# Patient Record
Sex: Female | Born: 1980
Health system: Southern US, Community
[De-identification: ages and names within clinical notes are randomized; demographics above are authoritative.]

## PROBLEM LIST (undated history)

## (undated) DIAGNOSIS — F32A Depression, unspecified: Secondary | ICD-10-CM

## (undated) DIAGNOSIS — F419 Anxiety disorder, unspecified: Secondary | ICD-10-CM

## (undated) DIAGNOSIS — R51 Headache: Secondary | ICD-10-CM

## (undated) DIAGNOSIS — Z973 Presence of spectacles and contact lenses: Secondary | ICD-10-CM

## (undated) DIAGNOSIS — E079 Disorder of thyroid, unspecified: Secondary | ICD-10-CM

## (undated) DIAGNOSIS — R519 Headache, unspecified: Secondary | ICD-10-CM

## (undated) DIAGNOSIS — K30 Functional dyspepsia: Secondary | ICD-10-CM

## (undated) DIAGNOSIS — S82843A Displaced bimalleolar fracture of unspecified lower leg, initial encounter for closed fracture: Secondary | ICD-10-CM

## (undated) DIAGNOSIS — F329 Major depressive disorder, single episode, unspecified: Secondary | ICD-10-CM

## (undated) HISTORY — DX: Depression, unspecified: F32.A

## (undated) HISTORY — DX: Disorder of thyroid, unspecified: E07.9

## (undated) HISTORY — DX: Anxiety disorder, unspecified: F41.9

## (undated) HISTORY — DX: Headache: R51

## (undated) HISTORY — DX: Presence of spectacles and contact lenses: Z97.3

## (undated) HISTORY — DX: Headache, unspecified: R51.9

---
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
## 2001-03-22 ENCOUNTER — Other Ambulatory Visit: Admission: RE | Admit: 2001-03-22 | Discharge: 2001-03-22 | Payer: Self-pay | Admitting: Obstetrics & Gynecology

## 2001-08-26 ENCOUNTER — Inpatient Hospital Stay (HOSPITAL_COMMUNITY): Admission: AD | Admit: 2001-08-26 | Discharge: 2001-08-26 | Payer: Self-pay | Admitting: Obstetrics & Gynecology

## 2001-08-30 ENCOUNTER — Inpatient Hospital Stay (HOSPITAL_COMMUNITY): Admission: AD | Admit: 2001-08-30 | Discharge: 2001-08-30 | Payer: Self-pay | Admitting: Obstetrics and Gynecology

## 2001-10-17 ENCOUNTER — Inpatient Hospital Stay (HOSPITAL_COMMUNITY): Admission: AD | Admit: 2001-10-17 | Discharge: 2001-10-17 | Payer: Self-pay | Admitting: Obstetrics and Gynecology

## 2001-10-17 ENCOUNTER — Inpatient Hospital Stay (HOSPITAL_COMMUNITY): Admission: AD | Admit: 2001-10-17 | Discharge: 2001-10-20 | Payer: Self-pay | Admitting: Obstetrics and Gynecology

## 2001-11-28 ENCOUNTER — Other Ambulatory Visit: Admission: RE | Admit: 2001-11-28 | Discharge: 2001-11-28 | Payer: Self-pay | Admitting: Obstetrics & Gynecology

## 2003-02-22 ENCOUNTER — Other Ambulatory Visit: Admission: RE | Admit: 2003-02-22 | Discharge: 2003-02-22 | Payer: Self-pay | Admitting: Obstetrics and Gynecology

## 2003-12-31 ENCOUNTER — Inpatient Hospital Stay (HOSPITAL_COMMUNITY): Admission: AD | Admit: 2003-12-31 | Discharge: 2003-12-31 | Payer: Self-pay | Admitting: Obstetrics & Gynecology

## 2004-02-20 ENCOUNTER — Inpatient Hospital Stay (HOSPITAL_COMMUNITY): Admission: AD | Admit: 2004-02-20 | Discharge: 2004-02-22 | Payer: Self-pay | Admitting: Obstetrics & Gynecology

## 2004-03-28 ENCOUNTER — Other Ambulatory Visit: Admission: RE | Admit: 2004-03-28 | Discharge: 2004-03-28 | Payer: Self-pay | Admitting: Obstetrics & Gynecology

## 2006-03-15 ENCOUNTER — Ambulatory Visit: Payer: Self-pay | Admitting: Family Medicine

## 2006-12-28 ENCOUNTER — Ambulatory Visit: Payer: Self-pay | Admitting: Family Medicine

## 2007-03-16 ENCOUNTER — Other Ambulatory Visit: Admission: RE | Admit: 2007-03-16 | Discharge: 2007-03-16 | Payer: Self-pay | Admitting: Family Medicine

## 2007-03-16 ENCOUNTER — Ambulatory Visit: Payer: Self-pay | Admitting: Family Medicine

## 2007-07-14 ENCOUNTER — Ambulatory Visit: Payer: Self-pay | Admitting: Family Medicine

## 2008-03-01 ENCOUNTER — Ambulatory Visit: Payer: Self-pay | Admitting: Family Medicine

## 2008-03-19 ENCOUNTER — Other Ambulatory Visit: Admission: RE | Admit: 2008-03-19 | Discharge: 2008-03-19 | Payer: Self-pay | Admitting: Family Medicine

## 2008-03-19 ENCOUNTER — Ambulatory Visit: Payer: Self-pay | Admitting: Family Medicine

## 2008-04-24 ENCOUNTER — Ambulatory Visit: Payer: Self-pay | Admitting: Family Medicine

## 2008-12-06 ENCOUNTER — Ambulatory Visit: Payer: Self-pay | Admitting: Family Medicine

## 2009-05-09 ENCOUNTER — Ambulatory Visit: Payer: Self-pay | Admitting: Family Medicine

## 2009-05-09 ENCOUNTER — Other Ambulatory Visit: Admission: RE | Admit: 2009-05-09 | Discharge: 2009-05-09 | Payer: Self-pay | Admitting: Family Medicine

## 2010-05-06 ENCOUNTER — Ambulatory Visit: Payer: Self-pay | Admitting: Physician Assistant

## 2010-08-31 ENCOUNTER — Encounter: Payer: Self-pay | Admitting: Obstetrics & Gynecology

## 2010-12-26 NOTE — H&P (Signed)
NAMEJALIZA, Heather Maynard                       ACCOUNT NO.:  [id number]   MEDICAL RECORD NO.:  [id number]                   PATIENT TYPE:  INP   LOCATION:  9168                                 FACILITY:  [facility]   PHYSICIAN:  Freddy Finner, M.D.                DATE OF BIRTH:  10/30/1980   DATE OF ADMISSION:  02/20/2004  DATE OF DISCHARGE:                                HISTORY & PHYSICAL   ADMISSION DIAGNOSES:  1. Intrauterine pregnancy at term, with mild late onset.  2. Gestational hypertension/pregnancy-induced hypertension.  3. Favorable cervix, for induction.   HISTORY:  The patient is a 30 year old white married female, gravida 2, para  1, who has a history of pre-term labor with her first pregnancy, but carried  to term.  She has been followed closely for that potential problem during  this pregnancy.  She now has arrived at 40-1/7th weeks gestation.  We have  seen a mild rise in her blood pressure to 134/78, with a trace of  proteinuria.  She has had no other significant symptoms.  Her cervix is  found to be favorable, 2 cm dilated, 90% effaced, vertex -1.  She is now  admitted for delivery.  The [facility] labs on the day prior to this admission showed all parameters normal  except for an alkaline phosphatase of 292.  Non-stress test in the office  today was reactive.   REVIEW OF SYSTEMS:  Basically negative.   PAST MEDICAL HISTORY/FAMILY HISTORY:  According to the prenatal summary.   PHYSICAL EXAMINATION:  HEENT:  Grossly within normal limits.  NECK:  The thyroid gland is not palpably enlarged.  VITAL SIGNS:  Blood pressure in the office 134/78.  NEUROLOGIC:  Deep tendon reflexes are +1 to +2.  CHEST:  Clear to auscultation.  HEART:  A normal sinus rhythm without murmurs, rubs, or gallops.  ABDOMEN:  Gravid.  An estimated fetal weight of 7-1/2 pounds.  PELVIC:  Cervix is as noted above.  EXTREMITIES:  With +1 edema.   ASSESSMENT:  1. Intrauterine pregnancy at term.  2. Mild  gestational hypertension with favorable cervix, with impending     progression of pregnancy-induced hypertension.   PLAN:  IV Pitocin when appropriate.  Expect a vaginal delivery.                                               Freddy Finner, M.D.    WRN/MEDQ  D:  02/20/2004  T:  02/20/2004  Job:  [id number]

## 2011-02-20 LAB — RUBELLA ANTIBODY, IGM: Rubella: UNDETERMINED

## 2011-02-20 LAB — ABO/RH: RH Type: POSITIVE

## 2011-02-20 LAB — ANTIBODY SCREEN: Antibody Screen: NEGATIVE

## 2011-02-20 LAB — RPR: RPR: NONREACTIVE

## 2011-02-20 LAB — HIV ANTIBODY (ROUTINE TESTING W REFLEX): HIV: NONREACTIVE

## 2011-08-11 NOTE — L&D Delivery Note (Signed)
Delivery Note At 9:08 PM a viable female was delivered via Vaginal, Spontaneous Delivery (Presentation: Right Occiput Anterior).  APGAR: , ; weight .  8"7' Placenta status: Intact, Spontaneous.  Cord: 3 vessels with the following complications: None.  Cord pH: not sent  Anesthesia:   Episiotomy: small second degree Lacerations:  Suture Repair: 3.0 chromic Est. Blood Loss (mL): 300  Mom to postpartum.  Baby to nursery-stable.  Meriel Pica 09/25/2011, 9:18 PM

## 2011-08-31 LAB — STREP B DNA PROBE: GBS: NEGATIVE

## 2011-09-21 ENCOUNTER — Encounter (HOSPITAL_COMMUNITY): Payer: Self-pay | Admitting: *Deleted

## 2011-09-21 ENCOUNTER — Telehealth (HOSPITAL_COMMUNITY): Payer: Self-pay | Admitting: *Deleted

## 2011-09-21 NOTE — Telephone Encounter (Signed)
Preadmission screen  

## 2011-09-23 ENCOUNTER — Telehealth (HOSPITAL_COMMUNITY): Payer: Self-pay | Admitting: *Deleted

## 2011-09-23 NOTE — Telephone Encounter (Signed)
Preadmission screen  

## 2011-09-24 ENCOUNTER — Inpatient Hospital Stay (HOSPITAL_COMMUNITY): Admission: RE | Admit: 2011-09-24 | Payer: Self-pay | Source: Ambulatory Visit

## 2011-09-24 ENCOUNTER — Inpatient Hospital Stay (HOSPITAL_COMMUNITY): Admission: AD | Admit: 2011-09-24 | Payer: Self-pay | Source: Ambulatory Visit | Admitting: Obstetrics and Gynecology

## 2011-09-24 NOTE — H&P (Signed)
[facility]  DICTATION # [id number] CSN# [id number]   Meriel Pica, MD 09/24/2011 10:47 AM

## 2011-09-25 ENCOUNTER — Encounter (HOSPITAL_COMMUNITY): Payer: Self-pay

## 2011-09-25 ENCOUNTER — Encounter (HOSPITAL_COMMUNITY): Payer: Self-pay | Admitting: Anesthesiology

## 2011-09-25 ENCOUNTER — Inpatient Hospital Stay (HOSPITAL_COMMUNITY): Payer: BC Managed Care – PPO | Admitting: Anesthesiology

## 2011-09-25 ENCOUNTER — Inpatient Hospital Stay (HOSPITAL_COMMUNITY)
Admission: RE | Admit: 2011-09-25 | Discharge: 2011-09-27 | DRG: 373 | Disposition: A | Discharge status: Home / Self Care | Payer: BC Managed Care – PPO | Source: Ambulatory Visit | Attending: Obstetrics and Gynecology | Admitting: Obstetrics and Gynecology

## 2011-09-25 LAB — CBC
MCV: 84.6 fL (ref 78.0–100.0)
Platelets: 291 10*3/uL (ref 150–400)
RBC: 4.35 MIL/uL (ref 3.87–5.11)
WBC: 11.1 10*3/uL — ABNORMAL HIGH (ref 4.0–10.5)

## 2011-09-25 LAB — RPR: RPR Ser Ql: NONREACTIVE

## 2011-09-25 MED ORDER — ZOLPIDEM TARTRATE 5 MG PO TABS: 5.0000 mg | ORAL_TABLET | Freq: Every evening | ORAL | Status: DC | PRN

## 2011-09-25 MED ORDER — OXYCODONE-ACETAMINOPHEN 5-325 MG PO TABS
1.0000 | ORAL_TABLET | Freq: Four times a day (QID) | ORAL | Status: DC | PRN
  Administered 2011-09-25 – 2011-09-27 (×4): 2 via ORAL
  Administered 2011-09-27: 1 via ORAL
  Filled 2011-09-25 (×2): qty 2
  Filled 2011-09-25: qty 1
  Filled 2011-09-25 (×2): qty 2

## 2011-09-25 MED ORDER — IBUPROFEN 600 MG PO TABS
600.0000 mg | ORAL_TABLET | Freq: Four times a day (QID) | ORAL | Status: DC | PRN
  Filled 2011-09-25: qty 1

## 2011-09-25 MED ORDER — ONDANSETRON HCL 4 MG/2ML IJ SOLN: 4.0000 mg | INTRAMUSCULAR | Status: DC | PRN

## 2011-09-25 MED ORDER — BENZOCAINE-MENTHOL 20-0.5 % EX AERO
1.0000 "application " | INHALATION_SPRAY | CUTANEOUS | Status: DC | PRN
  Administered 2011-09-25: 1 via TOPICAL

## 2011-09-25 MED ORDER — IBUPROFEN 800 MG PO TABS
800.0000 mg | ORAL_TABLET | Freq: Three times a day (TID) | ORAL | Status: DC | PRN
  Administered 2011-09-26 – 2011-09-27 (×4): 800 mg via ORAL
  Filled 2011-09-25 (×5): qty 1

## 2011-09-25 MED ORDER — FLEET ENEMA 7-19 GM/118ML RE ENEM: 1.0000 | ENEMA | RECTAL | Status: DC | PRN

## 2011-09-25 MED ORDER — DIBUCAINE 1 % RE OINT: 1.0000 "application " | TOPICAL_OINTMENT | RECTAL | Status: DC | PRN

## 2011-09-25 MED ORDER — LACTATED RINGERS IV SOLN: 500.0000 mL | INTRAVENOUS | Status: DC | PRN

## 2011-09-25 MED ORDER — LIDOCAINE HCL 1.5 % IJ SOLN
INTRAMUSCULAR | Status: DC | PRN
  Administered 2011-09-25 (×2): 5 mL via EPIDURAL

## 2011-09-25 MED ORDER — PRENATAL MULTIVITAMIN CH
1.0000 | ORAL_TABLET | Freq: Every day | ORAL | Status: DC
  Administered 2011-09-26 – 2011-09-27 (×2): 1 via ORAL
  Filled 2011-09-25 (×2): qty 1

## 2011-09-25 MED ORDER — LACTATED RINGERS IV SOLN
INTRAVENOUS | Status: DC
  Administered 2011-09-25 (×2): 125 mL/h via INTRAVENOUS

## 2011-09-25 MED ORDER — TETANUS-DIPHTH-ACELL PERTUSSIS 5-2.5-18.5 LF-MCG/0.5 IM SUSP: 0.5000 mL | Freq: Once | INTRAMUSCULAR | Status: DC

## 2011-09-25 MED ORDER — LANOLIN HYDROUS EX OINT: TOPICAL_OINTMENT | CUTANEOUS | Status: DC | PRN

## 2011-09-25 MED ORDER — FENTANYL 2.5 MCG/ML BUPIVACAINE 1/10 % EPIDURAL INFUSION (WH - ANES)
14.0000 mL/h | INTRAMUSCULAR | Status: DC
  Administered 2011-09-25: 14 mL/h via EPIDURAL
  Filled 2011-09-25 (×2): qty 60

## 2011-09-25 MED ORDER — OXYTOCIN BOLUS FROM INFUSION
500.0000 mL | Freq: Once | INTRAVENOUS | Status: DC
  Filled 2011-09-25: qty 500
  Filled 2011-09-25: qty 1000

## 2011-09-25 MED ORDER — MEASLES, MUMPS & RUBELLA VAC ~~LOC~~ INJ
0.5000 mL | INJECTION | Freq: Once | SUBCUTANEOUS | Status: AC
  Administered 2011-09-27: 0.5 mL via SUBCUTANEOUS
  Filled 2011-09-25 (×2): qty 0.5

## 2011-09-25 MED ORDER — OXYTOCIN 20 UNITS IN LACTATED RINGERS INFUSION - SIMPLE: 125.0000 mL/h | Freq: Once | INTRAVENOUS | Status: DC

## 2011-09-25 MED ORDER — DIPHENHYDRAMINE HCL 25 MG PO CAPS: 25.0000 mg | ORAL_CAPSULE | Freq: Four times a day (QID) | ORAL | Status: DC | PRN

## 2011-09-25 MED ORDER — SIMETHICONE 80 MG PO CHEW: 80.0000 mg | CHEWABLE_TABLET | ORAL | Status: DC | PRN

## 2011-09-25 MED ORDER — FLEET ENEMA 7-19 GM/118ML RE ENEM: 1.0000 | ENEMA | Freq: Every day | RECTAL | Status: DC | PRN

## 2011-09-25 MED ORDER — OXYTOCIN 20 UNITS IN LACTATED RINGERS INFUSION - SIMPLE
1.0000 m[IU]/min | INTRAVENOUS | Status: DC
  Administered 2011-09-25: 2 m[IU]/min via INTRAVENOUS

## 2011-09-25 MED ORDER — OXYCODONE-ACETAMINOPHEN 5-325 MG PO TABS: 1.0000 | ORAL_TABLET | ORAL | Status: DC | PRN

## 2011-09-25 MED ORDER — ONDANSETRON HCL 4 MG/2ML IJ SOLN
4.0000 mg | Freq: Four times a day (QID) | INTRAMUSCULAR | Status: DC | PRN
  Administered 2011-09-25: 4 mg via INTRAVENOUS
  Filled 2011-09-25: qty 2

## 2011-09-25 MED ORDER — ACETAMINOPHEN 325 MG PO TABS: 650.0000 mg | ORAL_TABLET | ORAL | Status: DC | PRN

## 2011-09-25 MED ORDER — DIPHENHYDRAMINE HCL 50 MG/ML IJ SOLN: 12.5000 mg | INTRAMUSCULAR | Status: DC | PRN

## 2011-09-25 MED ORDER — BISACODYL 10 MG RE SUPP: 10.0000 mg | Freq: Every day | RECTAL | Status: DC | PRN

## 2011-09-25 MED ORDER — FENTANYL 2.5 MCG/ML BUPIVACAINE 1/10 % EPIDURAL INFUSION (WH - ANES)
INTRAMUSCULAR | Status: DC | PRN
  Administered 2011-09-25: 14 mL/h via EPIDURAL

## 2011-09-25 MED ORDER — BENZOCAINE-MENTHOL 20-0.5 % EX AERO
INHALATION_SPRAY | CUTANEOUS | Status: AC
  Filled 2011-09-25: qty 56

## 2011-09-25 MED ORDER — ONDANSETRON HCL 4 MG PO TABS: 4.0000 mg | ORAL_TABLET | ORAL | Status: DC | PRN

## 2011-09-25 MED ORDER — LACTATED RINGERS IV SOLN
500.0000 mL | Freq: Once | INTRAVENOUS | Status: AC
  Administered 2011-09-25: 500 mL via INTRAVENOUS

## 2011-09-25 MED ORDER — SENNOSIDES-DOCUSATE SODIUM 8.6-50 MG PO TABS: 2.0000 | ORAL_TABLET | Freq: Every day | ORAL | Status: DC

## 2011-09-25 MED ORDER — WITCH HAZEL-GLYCERIN EX PADS: 1.0000 "application " | MEDICATED_PAD | CUTANEOUS | Status: DC | PRN

## 2011-09-25 MED ORDER — LIDOCAINE HCL (PF) 1 % IJ SOLN: 30.0000 mL | INTRAMUSCULAR | Status: DC | PRN

## 2011-09-25 MED ORDER — EPHEDRINE 5 MG/ML INJ
10.0000 mg | INTRAVENOUS | Status: DC | PRN
  Filled 2011-09-25: qty 4

## 2011-09-25 MED ORDER — CITRIC ACID-SODIUM CITRATE 334-500 MG/5ML PO SOLN: 30.0000 mL | ORAL | Status: DC | PRN

## 2011-09-25 MED ORDER — PHENYLEPHRINE 40 MCG/ML (10ML) SYRINGE FOR IV PUSH (FOR BLOOD PRESSURE SUPPORT)
80.0000 ug | PREFILLED_SYRINGE | INTRAVENOUS | Status: DC | PRN
  Filled 2011-09-25: qty 5

## 2011-09-25 MED ORDER — PHENYLEPHRINE 40 MCG/ML (10ML) SYRINGE FOR IV PUSH (FOR BLOOD PRESSURE SUPPORT): 80.0000 ug | PREFILLED_SYRINGE | INTRAVENOUS | Status: DC | PRN

## 2011-09-25 MED ORDER — TERBUTALINE SULFATE 1 MG/ML IJ SOLN: 0.2500 mg | Freq: Once | INTRAMUSCULAR | Status: DC | PRN

## 2011-09-25 MED ORDER — EPHEDRINE 5 MG/ML INJ: 10.0000 mg | INTRAVENOUS | Status: DC | PRN

## 2011-09-25 NOTE — Anesthesia Preprocedure Evaluation (Addendum)
Anesthesia Evaluation  Patient identified by MRN, date of birth, ID band Patient awake    Reviewed: Allergy & Precautions, H&P , NPO status , Patient's Chart, lab work & pertinent test results  Airway Mallampati: II TM Distance: >3 FB Neck ROM: full    Dental No notable dental hx.    Pulmonary neg pulmonary ROS,  clear to auscultation  Pulmonary exam normal       Cardiovascular neg cardio ROS     Neuro/Psych PSYCHIATRIC DISORDERS Anxiety Negative Neurological ROS     GI/Hepatic negative GI ROS, Neg liver ROS,   Endo/Other  Negative Endocrine ROS  Renal/GU negative Renal ROS  Genitourinary negative   Musculoskeletal negative musculoskeletal ROS (+)   Abdominal Normal abdominal exam  (+)   Peds negative pediatric ROS (+)  Hematology negative hematology ROS (+)   Anesthesia Other Findings   Reproductive/Obstetrics (+) Pregnancy                           Anesthesia Physical Anesthesia Plan  ASA: II  Anesthesia Plan: Epidural   Post-op Pain Management:    Induction:   Airway Management Planned:   Additional Equipment:   Intra-op Plan:   Post-operative Plan:   Informed Consent: I have reviewed the patients History and Physical, chart, labs and discussed the procedure including the risks, benefits and alternatives for the proposed anesthesia with the patient or authorized representative who has indicated his/her understanding and acceptance.     Plan Discussed with:   Anesthesia Plan Comments:         Anesthesia Quick Evaluation

## 2011-09-25 NOTE — H&P (Signed)
NAME:  Heather Maynard, Heather Maynard                  ACCOUNT NO.:  MEDICAL RECORD NO.:  [id number]  LOCATION:                                 FACILITY:  PHYSICIAN:  Duke Salvia. Marcelle Overlie, M.D.DATE OF BIRTH:  1981-01-28  DATE OF ADMISSION:  09/25/2011 DATE OF DISCHARGE:                             HISTORY & PHYSICAL   CHIEF COMPLAINT:  For labor induction, term, favorable cervix.  HISTORY OF PRESENT ILLNESS:  A 31 year old G73, P2-0-0-2, EDD is September 24, 2011, presents for induction of labor at term with a favorable cervix, GBS was negative.  Her 1-hour GTT was normal at 103.  Early pregnancy screening including first trimester screen and AFP only returned normal.  Prenatal blood work normal except for rubella titer that was equivocal.  PAST MEDICAL HISTORY:  Please see the [facility] form for details.  PHYSICAL EXAMINATION:  VITAL SIGNS:  Temp 98.2, blood pressure 110/62. HEENT:  Unremarkable. NECK:  Supple without masses. LUNGS:  Clear. CARDIOVASCULAR:  Regular rate and rhythm without murmurs, rubs, or gallops. BREASTS:  Not examined, term, fundal height.  Fetal heart rate 140. Cervix was 2, 50% vertex, -2.  Membranes intact. EXTREMITIES:  Unremarkable. NEUROLOGIC:  Unremarkable.  IMPRESSION:  Term intrauterine pregnancy, favorable cervix, GBS negative.  PLAN:  We will admit for AROM labor induction, procedure and protocol discussed with Dr. Marcelle Overlie.     Avaley Coop M. Marcelle Overlie, M.D.     RMH/MEDQ  D:  09/24/2011  T:  09/25/2011  Job:  [id number]

## 2011-09-25 NOTE — Anesthesia Procedure Notes (Signed)
Epidural Patient location during procedure: OB Start time: 09/25/2011 5:09 PM End time: 09/25/2011 5:13 PM Reason for block: procedure for pain  Staffing Anesthesiologist: Sandrea Hughs Performed by: anesthesiologist   Preanesthetic Checklist Completed: patient identified, site marked, surgical consent, pre-op evaluation, timeout performed, IV checked, risks and benefits discussed and monitors and equipment checked  Epidural Patient position: sitting Prep: site prepped and draped and DuraPrep Patient monitoring: continuous pulse ox and blood pressure Approach: midline Injection technique: LOR air  Needle:  Needle type: Tuohy  Needle gauge: 17 G Needle length: 9 cm Needle insertion depth: 5 cm cm Catheter type: closed end flexible Catheter size: 19 Gauge Catheter at skin depth: 10 cm Test dose: negative and 1.5% lidocaine  Assessment Sensory level: T8 Events: blood not aspirated, injection not painful, no injection resistance, negative IV test and no paresthesia

## 2011-09-26 LAB — CBC
Hemoglobin: 11.2 g/dL — ABNORMAL LOW (ref 12.0–15.0)
MCH: 28.1 pg (ref 26.0–34.0)
MCHC: 32.8 g/dL (ref 30.0–36.0)
Platelets: 255 10*3/uL (ref 150–400)
RDW: 14.6 % (ref 11.5–15.5)

## 2011-09-26 MED ORDER — FAMOTIDINE 20 MG PO TABS: 20.0000 mg | ORAL_TABLET | Freq: Two times a day (BID) | ORAL | Status: DC

## 2011-09-26 MED ORDER — CALCIUM CARBONATE ANTACID 500 MG PO CHEW
400.0000 mg | CHEWABLE_TABLET | Freq: Once | ORAL | Status: AC
  Administered 2011-09-26: 400 mg via ORAL
  Filled 2011-09-26: qty 2

## 2011-09-26 NOTE — Addendum Note (Signed)
Addendum  created 09/26/11 1017 by Doreene Burke, CRNA   Modules edited:Charges VN, Notes Section

## 2011-09-26 NOTE — Anesthesia Postprocedure Evaluation (Signed)
Anesthesia Post Note  Patient: Heather Maynard  Procedure(s) Performed: * No procedures listed *  Anesthesia type: Epidural  Patient location: Mother/Baby  Post pain: Pain level controlled  Post assessment: Post-op Vital signs reviewed  Last Vitals:  Filed Vitals:   09/26/11 0419  BP: 115/69  Pulse: 66  Temp: 36.4 C  Resp: 20    Post vital signs: Reviewed  Level of consciousness: awake  Complications: No apparent anesthesia complications

## 2011-09-26 NOTE — Progress Notes (Signed)
Post Partum Day 1 Subjective: no complaints  Objective: Blood pressure 115/69, pulse 66, temperature 97.6 F (36.4 C), temperature source Oral, resp. rate 20, height 5\' 4"  (1.626 m), weight 191 lb (86.637 kg), last menstrual period 12/18/2010, SpO2 99.00%, unknown if currently breastfeeding.  Physical Exam:  General: alert Lochia: appropriate Uterine Fundus: firm Incision: NA DVT Evaluation: No evidence of DVT seen on physical exam.   Basename 09/26/11 0602 09/25/11 0800  HGB 11.2* 12.3  HCT 34.1* 36.8    Assessment/Plan: Plan for discharge tomorrow   LOS: 1 day   Heather Maynard M 09/26/2011, 9:21 AM

## 2011-09-26 NOTE — Anesthesia Postprocedure Evaluation (Signed)
Anesthesia Post Note  Patient: Heather Maynard  Procedure(s) Performed: * No procedures listed *  Anesthesia type: Epidural  Patient location: Mother/Baby  Post pain: Pain level controlled  Post assessment: Post-op Vital signs reviewed  Last Vitals:  Filed Vitals:   09/25/11 2247  BP: 120/49  Pulse: 86  Temp:   Resp: 18    Post vital signs: Reviewed  Level of consciousness: awake  Complications: No apparent anesthesia complications

## 2011-09-26 NOTE — Progress Notes (Signed)

## 2011-09-27 MED ORDER — IBUPROFEN 800 MG PO TABS: 800.0000 mg | ORAL_TABLET | Freq: Three times a day (TID) | ORAL | Status: AC | PRN

## 2011-09-27 MED ORDER — OXYCODONE-ACETAMINOPHEN 5-325 MG PO TABS: 1.0000 | ORAL_TABLET | Freq: Four times a day (QID) | ORAL | Status: AC | PRN

## 2011-09-27 NOTE — Progress Notes (Signed)
Post Partum Day 2 Subjective: no complaints  Objective: Blood pressure 101/64, pulse 64, temperature 97.9 F (36.6 C), temperature source Oral, resp. rate 18, height 5\' 4"  (1.626 m), weight 191 lb (86.637 kg), last menstrual period 12/18/2010, SpO2 99.00%, unknown if currently breastfeeding.  Physical Exam:  General: alert Lochia: appropriate Uterine Fundus: firm Incision: healing well DVT Evaluation: No evidence of DVT seen on physical exam.   Basename 09/26/11 0602 09/25/11 0800  HGB 11.2* 12.3  HCT 34.1* 36.8    Assessment/Plan: Discharge home   LOS: 2 days   Ashford Clouse M 09/27/2011, 9:20 AM

## 2011-09-27 NOTE — Discharge Summary (Signed)
Obstetric Discharge Summary Reason for Admission: induction of labor Prenatal Procedures: none Intrapartum Procedures: spontaneous vaginal delivery Postpartum Procedures: none Complications-Operative and Postpartum: none Hemoglobin  Date Value Range Status  09/26/2011 11.2* 12.0-15.0 (g/dL) Final     HCT  Date Value Range Status  09/26/2011 34.1* 36.0-46.0 (%) Final    Discharge Diagnoses: Term Pregnancy-delivered  Discharge Information: Date: 09/27/2011 Activity: pelvic rest Diet: routine Medications: Ibuprofen and Percocet Condition: stable Instructions: refer to practice specific booklet Discharge to: home Follow-up Information    Follow up with Meriel Pica, MD. Schedule an appointment as soon as possible for a visit in 6 weeks.   Contact information:   [address] [place] [address] [phone]          Newborn Data: Live born female  Birth Weight: 8 lb 7.5 oz (3840 g) APGAR: 9, 10  Home with mother.  Jenaro Souder M 09/27/2011, 9:22 AM

## 2013-07-03 ENCOUNTER — Emergency Department (HOSPITAL_COMMUNITY)
Admission: EM | Admit: 2013-07-03 | Discharge: 2013-07-03 | Disposition: A | Discharge status: Home / Self Care | Payer: 59 | Source: Home / Self Care | Attending: Family Medicine | Admitting: Family Medicine

## 2013-07-03 ENCOUNTER — Encounter (HOSPITAL_COMMUNITY): Payer: Self-pay | Admitting: Emergency Medicine

## 2013-07-03 DIAGNOSIS — M545 Low back pain: Secondary | ICD-10-CM

## 2013-07-03 MED ORDER — CYCLOBENZAPRINE HCL 10 MG PO TABS: 10.0000 mg | ORAL_TABLET | Freq: Every evening | ORAL | Status: DC | PRN

## 2013-07-03 MED ORDER — MELOXICAM 15 MG PO TABS: 15.0000 mg | ORAL_TABLET | Freq: Every day | ORAL | Status: DC | PRN

## 2013-07-03 MED ORDER — TRAMADOL HCL 50 MG PO TABS: 50.0000 mg | ORAL_TABLET | Freq: Four times a day (QID) | ORAL | Status: DC | PRN

## 2013-07-03 NOTE — ED Provider Notes (Signed)
Heather Maynard is a 32 y.o. female who presents to Urgent Care today for left-sided low back pain starting 2 days ago. Patient has moderate pain worse with activity. She denies any radiation weakness numbness bowel bladder dysfunction or difficulty walking. She's tried ibuprofen, heat, and icy hot which have not worked very well. She's had 2 previous episodes similar to this in the recent past. She denies any injury.   Past Medical History  Diagnosis Date  . History of chicken pox   . Anxiety     panic attacks   History  Substance Use Topics  . Smoking status: Never Smoker   . Smokeless tobacco: Never Used  . Alcohol Use: No   ROS as above Medications reviewed. No current facility-administered medications for this encounter.   Current Outpatient Prescriptions  Medication Sig Dispense Refill  . cyclobenzaprine (FLEXERIL) 10 MG tablet Take 1 tablet (10 mg total) by mouth at bedtime as needed for muscle spasms.  20 tablet  0  . meloxicam (MOBIC) 15 MG tablet Take 1 tablet (15 mg total) by mouth daily as needed for pain.  30 tablet  0  . traMADol (ULTRAM) 50 MG tablet Take 1 tablet (50 mg total) by mouth every 6 (six) hours as needed.  15 tablet  0    Exam:  BP 132/77  Pulse 75  Temp(Src) 98.4 F (36.9 C) (Oral)  Resp 16  SpO2 100%  LMP 06/08/2013 Gen: Well NAD BACK: Nontender to spinal midline. Tender palpation left SI joint.  Negative straight leg raise and Faber bilaterally. Range of motion is intact. Strength is intact bilateral lower extremities. Reflexes are intact bilaterally.   No results found for this or any previous visit (from the past 24 hour(s)). No results found.  Assessment and Plan: 32 y.o. female with lumbago. Plan to treat with meloxicam, Flexeril, tramadol. Additionally home exercise and heating pad. Followup at sports medicine.  Discussed warning signs or symptoms. Please see discharge instructions. Patient expresses understanding.      Rodolph Bong, MD 07/03/13 2024

## 2013-07-03 NOTE — ED Notes (Signed)
Pt c/o lower back pain onset Saturday... Reports this is the 3rd time this has happened w/in a 5 month time frame States she was putting her daughter inside her car seat when she started to feel the pain Denies: f/v/n/d, urinary sxs Alert w/no signs of acute distress.

## 2013-07-24 NOTE — H&P (Signed)
JOYELL EMAMI  DICTATION # [id number] CSN# [id number]   Meriel Pica, MD 07/24/2013 1:51 PM

## 2013-07-25 ENCOUNTER — Encounter (HOSPITAL_COMMUNITY): Payer: Self-pay | Admitting: Pharmacist

## 2013-07-26 NOTE — H&P (Signed)
Heather Maynard, Heather Maynard             ACCOUNT NO.:  [id number]  MEDICAL RECORD NO.:  [id number]  LOCATION:                                FACILITY:  [facility]  PHYSICIAN:  Duke Salvia. Marcelle Overlie, M.D.DATE OF BIRTH:  10-22-1980  DATE OF ADMISSION:  08/01/2013 DATE OF DISCHARGE:                             HISTORY & PHYSICAL   CHIEF COMPLAINT:  Requests permanent sterilization.  HISTORY OF PRESENT ILLNESS:  A 32 year old, G3, P3, has __________ , currently using condoms, presents for outpatient tubal ligation.  She is sure she would not want to be pregnant again under any circumstance. This procedure including specific risks related to bleeding, infection. Other complications may require open and additional surgery such as bleeding discussed with her.  The permanence of the procedure and failure rated 2 to 3 per 1000 discussed with her which she understands and accepts.  PAST MEDICAL HISTORY:  Allergies none.  CURRENT MEDICATIONS:  Meloxicam p.r.n.  OBSTETRICAL HISTORY:  Three vaginal deliveries.  REVIEW OF SYSTEMS:  Negative.  FAMILY HISTORY:  Significant for UTI, diabetes and unspecified cancer.  SOCIAL HISTORY:  Denies alcohol, tobacco, or drug use.  She is married.  Last Pap, November 2014, was normal.  PHYSICAL EXAMINATION:  VITAL SIGNS:  Temp 98.2, blood pressure 110/70. HEENT:  Unremarkable. NECK:  Supple without masses. LUNGS:  Clear. CARDIOVASCULAR:  Regular rate and rhythm without murmurs, rubs, or gallops. BREASTS:  Without masses. ABDOMEN:  Soft, flat, nontender. GU:  Vulva, vagina and cervix normal.  Uterus anterior normal size. Adnexa negative.  EXTREMITIES:  Unremarkable. NEUROLOGIC:  Unremarkable.  IMPRESSION:  Requests permanent sterilization.  PLAN:  Laparoscopic tubal with Filshie clip application.     Heather Maynard M. Marcelle Overlie, M.D.     RMH/MEDQ  D:  07/24/2013  T:  07/25/2013  Job:  [id number]

## 2013-08-01 ENCOUNTER — Ambulatory Visit (HOSPITAL_COMMUNITY): Payer: 59 | Admitting: Anesthesiology

## 2013-08-01 ENCOUNTER — Encounter (HOSPITAL_COMMUNITY): Payer: Self-pay | Admitting: Registered Nurse

## 2013-08-01 ENCOUNTER — Encounter (HOSPITAL_COMMUNITY): Payer: 59 | Admitting: Anesthesiology

## 2013-08-01 ENCOUNTER — Encounter (HOSPITAL_COMMUNITY)
Admission: RE | Disposition: A | Discharge status: Home / Self Care | Payer: Self-pay | Source: Ambulatory Visit | Attending: Obstetrics and Gynecology

## 2013-08-01 ENCOUNTER — Ambulatory Visit (HOSPITAL_COMMUNITY)
Admission: RE | Admit: 2013-08-01 | Discharge: 2013-08-01 | Disposition: A | Discharge status: Home / Self Care | Payer: 59 | Source: Ambulatory Visit | Attending: Obstetrics and Gynecology | Admitting: Obstetrics and Gynecology

## 2013-08-01 ENCOUNTER — Inpatient Hospital Stay (HOSPITAL_COMMUNITY)
Admission: AD | Admit: 2013-08-01 | Discharge: 2013-08-01 | Discharge status: Against Medical Advice | Payer: 59 | Source: Ambulatory Visit | Attending: Obstetrics and Gynecology | Admitting: Obstetrics and Gynecology

## 2013-08-01 DIAGNOSIS — Z302 Encounter for sterilization: Secondary | ICD-10-CM | POA: Insufficient documentation

## 2013-08-01 HISTORY — PX: LAPAROSCOPIC TUBAL LIGATION: SHX1937

## 2013-08-01 LAB — CBC
HCT: 38.5 % (ref 36.0–46.0)
Hemoglobin: 13.2 g/dL (ref 12.0–15.0)
MCH: 28.4 pg (ref 26.0–34.0)
MCV: 83 fL (ref 78.0–100.0)
Platelets: 308 10*3/uL (ref 150–400)
RBC: 4.64 MIL/uL (ref 3.87–5.11)
RDW: 13 % (ref 11.5–15.5)
WBC: 8.2 10*3/uL (ref 4.0–10.5)

## 2013-08-01 LAB — PREGNANCY, URINE: Preg Test, Ur: NEGATIVE

## 2013-08-01 SURGERY — LIGATION, FALLOPIAN TUBE, LAPAROSCOPIC
Anesthesia: General | Site: Abdomen | Laterality: Bilateral

## 2013-08-01 MED ORDER — LIDOCAINE HCL (CARDIAC) 20 MG/ML IV SOLN
INTRAVENOUS | Status: DC | PRN
  Administered 2013-08-01: 50 mg via INTRAVENOUS

## 2013-08-01 MED ORDER — FENTANYL CITRATE 0.05 MG/ML IJ SOLN
INTRAMUSCULAR | Status: DC | PRN
  Administered 2013-08-01 (×2): 50 ug via INTRAVENOUS
  Administered 2013-08-01: 100 ug via INTRAVENOUS

## 2013-08-01 MED ORDER — SODIUM CHLORIDE 0.9 % IJ SOLN
INTRAMUSCULAR | Status: AC
  Filled 2013-08-01: qty 10

## 2013-08-01 MED ORDER — HYDROCODONE-ACETAMINOPHEN 5-325 MG PO TABS
1.0000 | ORAL_TABLET | Freq: Once | ORAL | Status: AC
  Administered 2013-08-01: 1 via ORAL

## 2013-08-01 MED ORDER — BUPIVACAINE HCL (PF) 0.25 % IJ SOLN
INTRAMUSCULAR | Status: AC
  Filled 2013-08-01: qty 30

## 2013-08-01 MED ORDER — MIDAZOLAM HCL 2 MG/2ML IJ SOLN
INTRAMUSCULAR | Status: AC
  Filled 2013-08-01: qty 2

## 2013-08-01 MED ORDER — ONDANSETRON HCL 4 MG/2ML IJ SOLN
INTRAMUSCULAR | Status: AC
  Filled 2013-08-01: qty 2

## 2013-08-01 MED ORDER — SUCCINYLCHOLINE CHLORIDE 20 MG/ML IJ SOLN
INTRAMUSCULAR | Status: AC
  Filled 2013-08-01: qty 10

## 2013-08-01 MED ORDER — HYDROCODONE-ACETAMINOPHEN 5-325 MG PO TABS
ORAL_TABLET | ORAL | Status: AC
  Filled 2013-08-01: qty 1

## 2013-08-01 MED ORDER — FENTANYL CITRATE 0.05 MG/ML IJ SOLN
INTRAMUSCULAR | Status: AC
  Filled 2013-08-01: qty 2

## 2013-08-01 MED ORDER — LACTATED RINGERS IV SOLN
INTRAVENOUS | Status: DC
  Administered 2013-08-01 (×2): via INTRAVENOUS

## 2013-08-01 MED ORDER — BUPIVACAINE HCL (PF) 0.25 % IJ SOLN
INTRAMUSCULAR | Status: DC | PRN
  Administered 2013-08-01: 7 mL

## 2013-08-01 MED ORDER — KETOROLAC TROMETHAMINE 30 MG/ML IJ SOLN
INTRAMUSCULAR | Status: DC | PRN
  Administered 2013-08-01: 30 mg via INTRAVENOUS

## 2013-08-01 MED ORDER — ONDANSETRON HCL 4 MG/2ML IJ SOLN
INTRAMUSCULAR | Status: DC | PRN
  Administered 2013-08-01: 4 mg via INTRAVENOUS

## 2013-08-01 MED ORDER — DEXAMETHASONE SODIUM PHOSPHATE 10 MG/ML IJ SOLN
INTRAMUSCULAR | Status: DC | PRN
  Administered 2013-08-01: 10 mg via INTRAVENOUS

## 2013-08-01 MED ORDER — SODIUM CHLORIDE 0.9 % IJ SOLN
INTRAMUSCULAR | Status: DC | PRN
  Administered 2013-08-01: 10 mL

## 2013-08-01 MED ORDER — FENTANYL CITRATE 0.05 MG/ML IJ SOLN
25.0000 ug | INTRAMUSCULAR | Status: DC | PRN
  Administered 2013-08-01 (×3): 50 ug via INTRAVENOUS

## 2013-08-01 MED ORDER — HYDROCODONE-IBUPROFEN 7.5-200 MG PO TABS: 1.0000 | ORAL_TABLET | Freq: Three times a day (TID) | ORAL | Status: DC | PRN

## 2013-08-01 MED ORDER — MIDAZOLAM HCL 5 MG/5ML IJ SOLN
INTRAMUSCULAR | Status: DC | PRN
  Administered 2013-08-01: 2 mg via INTRAVENOUS

## 2013-08-01 MED ORDER — PROPOFOL 10 MG/ML IV BOLUS
INTRAVENOUS | Status: DC | PRN
  Administered 2013-08-01: 200 mg via INTRAVENOUS

## 2013-08-01 MED ORDER — PROPOFOL 10 MG/ML IV EMUL
INTRAVENOUS | Status: AC
  Filled 2013-08-01: qty 20

## 2013-08-01 MED ORDER — DEXAMETHASONE SODIUM PHOSPHATE 10 MG/ML IJ SOLN
INTRAMUSCULAR | Status: AC
  Filled 2013-08-01: qty 1

## 2013-08-01 MED ORDER — FENTANYL CITRATE 0.05 MG/ML IJ SOLN
INTRAMUSCULAR | Status: AC
  Administered 2013-08-01: 50 ug via INTRAVENOUS
  Filled 2013-08-01: qty 2

## 2013-08-01 MED ORDER — SUCCINYLCHOLINE CHLORIDE 20 MG/ML IJ SOLN
INTRAMUSCULAR | Status: DC | PRN
  Administered 2013-08-01: 100 mg via INTRAVENOUS

## 2013-08-01 MED ORDER — HYDROCODONE-ACETAMINOPHEN 7.5-325 MG/15ML PO SOLN: 10.0000 mL | Freq: Once | ORAL | Status: DC

## 2013-08-01 MED ORDER — LIDOCAINE HCL (CARDIAC) 20 MG/ML IV SOLN
INTRAVENOUS | Status: AC
Start: 2013-08-01 — End: 2013-08-01
  Filled 2013-08-01: qty 5

## 2013-08-01 MED ORDER — KETOROLAC TROMETHAMINE 30 MG/ML IJ SOLN
INTRAMUSCULAR | Status: AC
  Filled 2013-08-01: qty 1

## 2013-08-01 SURGICAL SUPPLY — 19 items
ADH SKN CLS APL DERMABOND .7 (GAUZE/BANDAGES/DRESSINGS) ×1
CATH ROBINSON RED A/P 16FR (CATHETERS) ×2 IMPLANT
CLIP FILSHIE TUBAL LIGA STRL (Clip) ×3 IMPLANT
CLOTH BEACON ORANGE TIMEOUT ST (SAFETY) ×2 IMPLANT
DERMABOND ADVANCED (GAUZE/BANDAGES/DRESSINGS) ×1
DERMABOND ADVANCED .7 DNX12 (GAUZE/BANDAGES/DRESSINGS) ×1 IMPLANT
DRSG COVADERM PLUS 2X2 (GAUZE/BANDAGES/DRESSINGS) ×1 IMPLANT
GLOVE BIO SURGEON STRL SZ7 (GLOVE) ×2 IMPLANT
GOWN PREVENTION PLUS LG XLONG (DISPOSABLE) ×4 IMPLANT
NEEDLE INSUFFLATION 120MM (ENDOMECHANICALS) ×2 IMPLANT
PACK LAPAROSCOPY BASIN (CUSTOM PROCEDURE TRAY) ×2 IMPLANT
STRIP CLOSURE SKIN 1/4X3 (GAUZE/BANDAGES/DRESSINGS) ×1 IMPLANT
SUT VIC AB 2-0 UR6 27 (SUTURE) IMPLANT
SUT VICRYL RAPIDE 4/0 PS 2 (SUTURE) ×2 IMPLANT
SYR 20CC LL (SYRINGE) ×2 IMPLANT
TOWEL OR 17X24 6PK STRL BLUE (TOWEL DISPOSABLE) ×4 IMPLANT
TROCAR XCEL DIL TIP R 11M (ENDOMECHANICALS) ×2 IMPLANT
WARMER LAPAROSCOPE (MISCELLANEOUS) ×2 IMPLANT
WATER STERILE IRR 1000ML POUR (IV SOLUTION) ×2 IMPLANT

## 2013-08-01 NOTE — Preoperative (Signed)
Beta Blockers   Reason not to administer Beta Blockers:Not Applicable 

## 2013-08-01 NOTE — Op Note (Signed)
Preoperative diagnosis: Request permanent sterilization  Postoperative diagnosis: Same  Procedure: Laparoscopic tubal by Filshie clip application  Surgeon: Marcelle Overlie  Anesthesia: Gen.  Blood loss: Less than 10 cc  Procedure and findings:  Patient taken the operating room after an adequate level of general anesthesia was obtained with the patient's legs in stirrups the abdomen perineum and vagina were prepped and draped in the usual fashion for laparoscopic tubal. The bladder was drained. Hulka tenaculum was positioned. Prior to this appropriate timeout for taken.  The subumbilical area was infiltrated with quarter percent Marcaine plain small incision was made in the varies needle was introduced without difficulty. After a 3 L pneumoperitoneum syncopated lap scopic trocar and sleeve were then introduced without difficulty. There was no evidence of any bleeding or trauma, the patient was then placed in Trendelenburg the pelvic findings were inspected and noted to be normal, uterus bilateral tubes and ovaries and cul-de-sac were free and clear. Quarter percent Marcaine was then draped across each tube from the cornu to the fimbriated end 4-5 cc at each site. Filshie clip was applied 3 cm from the cornu a right ankle completely engulfing the tube on each side after carefully identifying each tube. This was photo documented. In terms removed gas allowed to escape the defect closed with a 4-0 Vicryl subcuticular suture she tolerated this well went to recovery room in good condition.  Dictated with [facility]  Ashar Lewinski M. [place]

## 2013-08-01 NOTE — Transfer of Care (Signed)
Immediate Anesthesia Transfer of Care Note  Patient: Heather Maynard  Procedure(s) Performed: Procedure(s): LAPAROSCOPIC TUBAL LIGATION WITH FILSHIE CLIPS (Bilateral)  Patient Location: PACU  Anesthesia Type:General  Level of Consciousness: awake, alert  and oriented  Airway & Oxygen Therapy: Patient Spontanous Breathing and Patient connected to nasal cannula oxygen  Post-op Assessment: Report given to PACU RN  Post vital signs: Reviewed  Complications: No apparent anesthesia complications

## 2013-08-01 NOTE — Anesthesia Preprocedure Evaluation (Addendum)

## 2013-08-01 NOTE — Progress Notes (Signed)
The patient was re-examined with no change in status 

## 2013-08-01 NOTE — Anesthesia Postprocedure Evaluation (Signed)
Anesthesia Post Note  Patient: Heather Maynard  Procedure(s) Performed: Procedure(s) (LRB): LAPAROSCOPIC TUBAL LIGATION WITH FILSHIE CLIPS (Bilateral)  Anesthesia type: GA  Patient location: PACU  Post pain: Pain level controlled  Post assessment: Post-op Vital signs reviewed  Last Vitals:  Filed Vitals:   08/01/13 1415  BP: 152/73  Pulse: 73  Temp:   Resp: 13    Post vital signs: Reviewed  Level of consciousness: sedated  Complications: No apparent anesthesia complications

## 2013-08-02 ENCOUNTER — Encounter (HOSPITAL_COMMUNITY): Payer: Self-pay | Admitting: Obstetrics and Gynecology

## 2013-08-24 ENCOUNTER — Encounter (HOSPITAL_COMMUNITY): Payer: Self-pay | Admitting: Emergency Medicine

## 2013-08-24 ENCOUNTER — Emergency Department (HOSPITAL_COMMUNITY)
Admission: EM | Admit: 2013-08-24 | Discharge: 2013-08-24 | Disposition: A | Discharge status: Home / Self Care | Payer: 59 | Attending: Emergency Medicine | Admitting: Emergency Medicine

## 2013-08-24 ENCOUNTER — Emergency Department (HOSPITAL_COMMUNITY): Payer: 59

## 2013-08-24 DIAGNOSIS — S82891A Other fracture of right lower leg, initial encounter for closed fracture: Secondary | ICD-10-CM

## 2013-08-24 DIAGNOSIS — Z8659 Personal history of other mental and behavioral disorders: Secondary | ICD-10-CM | POA: Insufficient documentation

## 2013-08-24 DIAGNOSIS — Z8619 Personal history of other infectious and parasitic diseases: Secondary | ICD-10-CM | POA: Insufficient documentation

## 2013-08-24 DIAGNOSIS — Y9301 Activity, walking, marching and hiking: Secondary | ICD-10-CM | POA: Insufficient documentation

## 2013-08-24 DIAGNOSIS — S82899A Other fracture of unspecified lower leg, initial encounter for closed fracture: Secondary | ICD-10-CM | POA: Insufficient documentation

## 2013-08-24 DIAGNOSIS — X500XXA Overexertion from strenuous movement or load, initial encounter: Secondary | ICD-10-CM | POA: Insufficient documentation

## 2013-08-24 DIAGNOSIS — R296 Repeated falls: Secondary | ICD-10-CM | POA: Insufficient documentation

## 2013-08-24 DIAGNOSIS — S8253XA Displaced fracture of medial malleolus of unspecified tibia, initial encounter for closed fracture: Secondary | ICD-10-CM | POA: Insufficient documentation

## 2013-08-24 DIAGNOSIS — S82843A Displaced bimalleolar fracture of unspecified lower leg, initial encounter for closed fracture: Secondary | ICD-10-CM

## 2013-08-24 DIAGNOSIS — Y92009 Unspecified place in unspecified non-institutional (private) residence as the place of occurrence of the external cause: Secondary | ICD-10-CM | POA: Insufficient documentation

## 2013-08-24 HISTORY — DX: Displaced bimalleolar fracture of unspecified lower leg, initial encounter for closed fracture: S82.843A

## 2013-08-24 MED ORDER — HYDROMORPHONE HCL PF 1 MG/ML IJ SOLN
0.5000 mg | Freq: Once | INTRAMUSCULAR | Status: AC
  Administered 2013-08-24: 0.5 mg via INTRAVENOUS
  Filled 2013-08-24: qty 1

## 2013-08-24 MED ORDER — ONDANSETRON HCL 4 MG/2ML IJ SOLN
4.0000 mg | Freq: Once | INTRAMUSCULAR | Status: AC
  Administered 2013-08-24: 4 mg via INTRAVENOUS
  Filled 2013-08-24: qty 2

## 2013-08-24 MED ORDER — OXYCODONE-ACETAMINOPHEN 5-325 MG PO TABS: 2.0000 | ORAL_TABLET | ORAL | Status: DC | PRN

## 2013-08-24 MED ORDER — HYDROMORPHONE HCL PF 1 MG/ML IJ SOLN
1.0000 mg | Freq: Once | INTRAMUSCULAR | Status: AC
  Administered 2013-08-24: 1 mg via INTRAVENOUS
  Filled 2013-08-24: qty 1

## 2013-08-24 NOTE — ED Notes (Signed)
PA at bedside.

## 2013-08-24 NOTE — Discharge Instructions (Signed)
Please follow up with Dr. Eulah PontMurphy tomorrow at 8am for further evaluation.  You may need surgical intervention if indicative.  Use crutches and do not bear any direct weight on your ankle.  Take pain medication as prescribed.    Displaced Fibular Fracture (Adult, Ankle) Treated With ORIF Your displaced fracture is at the part of the fibula that is located at your ankle. Displaced means that the bone is not lined up correctly. Because of this, the bones must be put back into position with a procedure called open reduction and internal fixation (ORIF). ORIF ankle surgery will provide the best chance for your ankle to heal right and decrease the chances of later arthritis and disability.  LET [facility] Center[facility]OUR [facility]ALTH CARE PROVIDER KNOW ABOUT:  Any allergies you have.  All medicines you are taking, including vitamins, herbs, eye drops, creams, and over-the-counter medicines.  Previous problems you or members of your family have had with the use of anesthetics.  Any blood disorders you have.  Previous surgeries you have had.  Medical conditions you have. RISKS AND COMPLICATIONS Generally, this is a safe procedure. However, as with any procedure, complications can occur. Possible complications include:  Excessive bleeding.  Infection.  Posttraumatic arthritis.  Failure to heal properly resulting in an unstable ankle.  Stiffness of the ankle after the repair. BEFORE THE PROCEDURE  Do not eat or drink after midnight the day before your procedure, or as instructed by your health care provider.   Ask your health care provider about changing or stopping any regular medicines. You may need to stop taking certain medicines up to 1 week before the procedure.   You may have lab tests the morning of the procedure.   Make arrangements for someone to drive you home after your procedure. PROCEDURE   An IV tube will be inserted into one of your veins.  You will receive fluids and medicine to make you relax  through this tube.  You will then be given medicines to make you sleep (general anesthetic).  A cut (incision) will be made on the outside of your ankle to expose the bone and clean it out.  The bone is then aligned back together, and screws and plates are used to hold this in place.  The skin and tissue are sewn back together to cover the repaired bone.  Dressings are placed over your incision. AFTER THE PROCEDURE  You will be taken to a recovery area and monitored until the anesthetic wears off.  You will be given pain medicine to control the pain.  You will be discharged after your pain is controlled and you can drink liquids. Document Released: 07/27/2005 Document Revised: 05/17/2013 Document Reviewed: 02/23/2013 [facility] Patient Information 2014 [employer], [place].

## 2013-08-24 NOTE — ED Provider Notes (Signed)
Medical screening examination/treatment/procedure(s) were performed by non-physician practitioner and as supervising physician I was immediately available for consultation/collaboration.  EKG Interpretation   None         Darlys Galesavid Masneri, MD 08/24/13 1744

## 2013-08-24 NOTE — ED Notes (Signed)
Pt was outside this morning at father's house while caring her daughter pt twisted her ankle and fell to the ground. Denies hitting head or LOC. Pt c/o right ankle pain. Deformity noted to right ankle. PMS intact. EMS administered Fentanyl . 20g left hand.

## 2013-08-24 NOTE — Progress Notes (Signed)
Orthopedic Tech Progress Note Patient Details:  Lannette Donathiffany B Vanallen 1981-07-19 [id number]  Ortho Devices Type of Ortho Device: Ace wrap;Crutches;Post (short leg) splint Ortho Device/Splint Location: rue Ortho Device/Splint Interventions: Application Stirrup splint  Nikki DomCrawford, Chalsey Leeth 08/24/2013, 10:31 AM

## 2013-08-24 NOTE — ED Provider Notes (Signed)
CSN: [id number]     Arrival date & time 08/24/13  4098 History   First MD Initiated Contact with Patient 08/24/13 [phone]     Chief Complaint  Patient presents with  . Ankle Pain  . Fall   (Consider location/radiation/quality/duration/timing/severity/associated sxs/prior Treatment) HPI  33 year old female presents for evaluation of right ankle injury. Patient states she was walking outside of her father's house this morning when she accidentally twisted her right ankle on uneven ground and fell to the ground.  Denies hitting head or LOC.  Report acute onset of sharp pain to R ankle, unable to ambulate.  Pain is 10/10, worsening with movement, non radiating.  EMS administered Fentanyl which provide some relief.  Denies numbness, denies foot pain, knee pain or hip pain.  No prior ankle injury. Patient has no other complaint. Denies any recent alcohol use.    Past Medical History  Diagnosis Date  . History of chicken pox   . Anxiety     panic attacks   Past Surgical History  Procedure Laterality Date  . Laparoscopic tubal ligation Bilateral 08/01/2013    Procedure: LAPAROSCOPIC TUBAL LIGATION WITH FILSHIE CLIPS;  Surgeon: Meriel Pica, MD;  Location: [facility];  Service: Gynecology;  Laterality: Bilateral;   Family History  Problem Relation Age of Onset  . Hypertension Father   . Diabetes Father     insulin  . Heart disease Maternal Grandmother   . Hypertension Maternal Grandmother   . Heart disease Paternal Grandmother   . Hypertension Paternal Grandmother   . Diabetes Paternal Grandfather     insulin   History  Substance Use Topics  . Smoking status: Never Smoker   . Smokeless tobacco: Never Used  . Alcohol Use: No   OB History   Grav Para Term Preterm Abortions TAB SAB Ect Mult Living   3 3 3       3      Review of Systems  Constitutional: Negative for fever.  Musculoskeletal: Positive for joint swelling.  Skin: Negative for rash and wound.  Neurological:  Negative for numbness.    Allergies  Review of patient's allergies indicates no known allergies.  Home Medications   Current Outpatient Rx  Name  Route  Sig  Dispense  Refill  . ibuprofen (ADVIL,MOTRIN) 200 MG tablet   Oral   Take 400 mg by mouth every 6 (six) hours as needed for moderate pain.          BP 135/72  Pulse 94  Temp(Src) 97.5 F (36.4 C) (Oral)  Resp 18  SpO2 100%  LMP 08/19/2013  Breastfeeding? No Physical Exam  Nursing note and vitals reviewed. Constitutional: She appears well-developed and well-nourished. No distress.  HENT:  Head: Atraumatic.  Eyes: Conjunctivae are normal.  Neck: Neck supple.  Musculoskeletal: She exhibits tenderness (R ankle: obvious closed deformity with tenderness to medial/latera/posterior malleolar region with moderate swelling noted. no skin tenting. no tenderness at 5th MTP.).  No tenderness to R hip or R knee.    Pedal pulses intact and brisk cap refills to all toes.  Sensation intact distally.    Neurological: She is alert.  Skin: No rash noted.  Psychiatric: She has a normal mood and affect.    ED Course  Procedures (including critical care time)   A patient with mechanical fall injuring her right ankle. Ankle with obvious deformity, suspicious for a close fracture. No skin tenting.  Pain medication given, will obtain x-rays.  9:16 AM X-ray of  right ankle demonstrate fractures of the distal fibula and medial malleolus region is with displaced fragment. There is ankle mortise disruption.  Care discussed with Dr. Redgie GrayerMasneri.    9:35 AM I have consulted with orthopedist, Dr. Margarita Ranaimothy Murphy who recommend applying posterior and stirrup leg splint and to have close f/u at 8am tomorrow in office.  He will also review xray.    11:15 AM I have assisted ortho tech by applying pressure to medial/lateral ankle and medially rotate foot for better alignment while applying posterior/stirrup leg splint.  Pt is neurovascularly intact post  reduction and splinting.  Crutches provided.  Pt to f/u closely with Dr. Eulah PontMurphy tomorrow at 8am.   Labs Review Labs Reviewed - No data to display Imaging Review Dg Ankle Complete Right  08/24/2013   CLINICAL DATA:  Pain post trauma  EXAM: RIGHT ANKLE - COMPLETE 3+ VIEW  COMPARISON:  None.  FINDINGS: Frontal, oblique, and lateral views were obtained. There is a comminuted fracture of the medial malleolus with the medial malleolus displaced laterally. There is a fracture of the distal fibular metaphysis -diaphysis junction with displaced fragments. There is ankle mortise disruption.  IMPRESSION: Fractures of the distal fibula and medial malleolar regions with displaced fragments. There is ankle mortise disruption.   Electronically Signed   By: Bretta BangWilliam  Woodruff M.D.   On: 08/24/2013 09:00    EKG Interpretation   None       MDM   1. Closed right ankle fracture    BP 131/72  Pulse 104  Temp(Src) 97.5 F (36.4 C) (Oral)  Resp 18  SpO2 100%  LMP 08/19/2013  Breastfeeding? No  I have reviewed nursing notes and vital signs. I personally reviewed the imaging tests through PACS system  I reviewed available ER/hospitalization records thought the EMR     Fayrene HelperBowie Donye Dauenhauer, [place] JerseyPA-C 08/24/13 1116

## 2013-08-28 ENCOUNTER — Encounter (HOSPITAL_BASED_OUTPATIENT_CLINIC_OR_DEPARTMENT_OTHER): Payer: Self-pay | Admitting: *Deleted

## 2013-08-29 ENCOUNTER — Ambulatory Visit (HOSPITAL_BASED_OUTPATIENT_CLINIC_OR_DEPARTMENT_OTHER)
Admission: RE | Admit: 2013-08-29 | Discharge: 2013-08-29 | Disposition: A | Discharge status: Home / Self Care | Payer: 59 | Source: Ambulatory Visit | Attending: Orthopedic Surgery | Admitting: Orthopedic Surgery

## 2013-08-29 ENCOUNTER — Encounter (HOSPITAL_BASED_OUTPATIENT_CLINIC_OR_DEPARTMENT_OTHER): Payer: 59 | Admitting: Anesthesiology

## 2013-08-29 ENCOUNTER — Encounter (HOSPITAL_BASED_OUTPATIENT_CLINIC_OR_DEPARTMENT_OTHER)
Admission: RE | Disposition: A | Discharge status: Home / Self Care | Payer: Self-pay | Source: Ambulatory Visit | Attending: Orthopedic Surgery

## 2013-08-29 ENCOUNTER — Ambulatory Visit (HOSPITAL_BASED_OUTPATIENT_CLINIC_OR_DEPARTMENT_OTHER): Payer: 59 | Admitting: Anesthesiology

## 2013-08-29 ENCOUNTER — Encounter (HOSPITAL_BASED_OUTPATIENT_CLINIC_OR_DEPARTMENT_OTHER): Payer: Self-pay | Admitting: *Deleted

## 2013-08-29 DIAGNOSIS — W010XXA Fall on same level from slipping, tripping and stumbling without subsequent striking against object, initial encounter: Secondary | ICD-10-CM | POA: Insufficient documentation

## 2013-08-29 DIAGNOSIS — K219 Gastro-esophageal reflux disease without esophagitis: Secondary | ICD-10-CM | POA: Insufficient documentation

## 2013-08-29 DIAGNOSIS — F410 Panic disorder [episodic paroxysmal anxiety] without agoraphobia: Secondary | ICD-10-CM | POA: Insufficient documentation

## 2013-08-29 DIAGNOSIS — F411 Generalized anxiety disorder: Secondary | ICD-10-CM | POA: Insufficient documentation

## 2013-08-29 DIAGNOSIS — K3189 Other diseases of stomach and duodenum: Secondary | ICD-10-CM | POA: Insufficient documentation

## 2013-08-29 DIAGNOSIS — R1013 Epigastric pain: Secondary | ICD-10-CM

## 2013-08-29 DIAGNOSIS — S82843A Displaced bimalleolar fracture of unspecified lower leg, initial encounter for closed fracture: Secondary | ICD-10-CM | POA: Insufficient documentation

## 2013-08-29 HISTORY — DX: Displaced bimalleolar fracture of unspecified lower leg, initial encounter for closed fracture: S82.843A

## 2013-08-29 HISTORY — DX: Functional dyspepsia: K30

## 2013-08-29 HISTORY — PX: ORIF ANKLE FRACTURE: SHX5408

## 2013-08-29 SURGERY — OPEN REDUCTION INTERNAL FIXATION (ORIF) ANKLE FRACTURE
Anesthesia: Regional | Laterality: Right

## 2013-08-29 MED ORDER — FENTANYL CITRATE 0.05 MG/ML IJ SOLN
50.0000 ug | INTRAMUSCULAR | Status: DC | PRN
  Administered 2013-08-29: 100 ug via INTRAVENOUS

## 2013-08-29 MED ORDER — FENTANYL CITRATE 0.05 MG/ML IJ SOLN
INTRAMUSCULAR | Status: AC
  Filled 2013-08-29: qty 4

## 2013-08-29 MED ORDER — ACETAMINOPHEN 500 MG PO TABS
1000.0000 mg | ORAL_TABLET | Freq: Once | ORAL | Status: AC
  Administered 2013-08-29: 1000 mg via ORAL

## 2013-08-29 MED ORDER — MIDAZOLAM HCL 2 MG/2ML IJ SOLN
INTRAMUSCULAR | Status: AC
  Filled 2013-08-29: qty 2

## 2013-08-29 MED ORDER — CHLORHEXIDINE GLUCONATE 4 % EX LIQD: 60.0000 mL | Freq: Once | CUTANEOUS | Status: DC

## 2013-08-29 MED ORDER — ONDANSETRON HCL 4 MG/2ML IJ SOLN
INTRAMUSCULAR | Status: DC | PRN
  Administered 2013-08-29: 4 mg via INTRAVENOUS

## 2013-08-29 MED ORDER — OXYCODONE HCL 5 MG PO TABS
5.0000 mg | ORAL_TABLET | Freq: Once | ORAL | Status: AC | PRN
  Administered 2013-08-29: 5 mg via ORAL
  Filled 2013-08-29: qty 1

## 2013-08-29 MED ORDER — BUPIVACAINE HCL (PF) 0.5 % IJ SOLN
INTRAMUSCULAR | Status: AC
  Filled 2013-08-29: qty 30

## 2013-08-29 MED ORDER — FENTANYL CITRATE 0.05 MG/ML IJ SOLN
INTRAMUSCULAR | Status: AC
  Filled 2013-08-29: qty 2

## 2013-08-29 MED ORDER — DEXAMETHASONE SODIUM PHOSPHATE 10 MG/ML IJ SOLN
INTRAMUSCULAR | Status: DC | PRN
  Administered 2013-08-29: 10 mg via INTRAVENOUS

## 2013-08-29 MED ORDER — OXYCODONE HCL 5 MG/5ML PO SOLN: 5.0000 mg | Freq: Once | ORAL | Status: AC | PRN

## 2013-08-29 MED ORDER — LIDOCAINE HCL (CARDIAC) 20 MG/ML IV SOLN
INTRAVENOUS | Status: DC | PRN
  Administered 2013-08-29: 100 mg via INTRAVENOUS

## 2013-08-29 MED ORDER — LACTATED RINGERS IV SOLN
INTRAVENOUS | Status: DC
  Administered 2013-08-29 (×2): via INTRAVENOUS

## 2013-08-29 MED ORDER — CEFAZOLIN SODIUM-DEXTROSE 2-3 GM-% IV SOLR
INTRAVENOUS | Status: DC | PRN
  Administered 2013-08-29: 2 g via INTRAVENOUS

## 2013-08-29 MED ORDER — HYDROMORPHONE HCL PF 1 MG/ML IJ SOLN
INTRAMUSCULAR | Status: AC
  Filled 2013-08-29: qty 1

## 2013-08-29 MED ORDER — CEFAZOLIN SODIUM-DEXTROSE 2-3 GM-% IV SOLR
INTRAVENOUS | Status: AC
  Filled 2013-08-29: qty 50

## 2013-08-29 MED ORDER — PROPOFOL 10 MG/ML IV BOLUS
INTRAVENOUS | Status: DC | PRN
  Administered 2013-08-29: 200 mg via INTRAVENOUS
  Administered 2013-08-29: 30 mg via INTRAVENOUS

## 2013-08-29 MED ORDER — ACETAMINOPHEN 500 MG PO TABS
ORAL_TABLET | ORAL | Status: AC
  Filled 2013-08-29: qty 2

## 2013-08-29 MED ORDER — DEXTROSE-NACL 5-0.45 % IV SOLN: 100.0000 mL/h | INTRAVENOUS | Status: DC

## 2013-08-29 MED ORDER — FENTANYL CITRATE 0.05 MG/ML IJ SOLN
INTRAMUSCULAR | Status: DC | PRN
  Administered 2013-08-29 (×4): 25 ug via INTRAVENOUS

## 2013-08-29 MED ORDER — PROPOFOL 10 MG/ML IV EMUL
INTRAVENOUS | Status: AC
  Filled 2013-08-29: qty 50

## 2013-08-29 MED ORDER — MIDAZOLAM HCL 2 MG/ML PO SYRP: 12.0000 mg | ORAL_SOLUTION | Freq: Once | ORAL | Status: DC | PRN

## 2013-08-29 MED ORDER — ONDANSETRON HCL 4 MG/2ML IJ SOLN: 4.0000 mg | Freq: Four times a day (QID) | INTRAMUSCULAR | Status: DC | PRN

## 2013-08-29 MED ORDER — HYDROMORPHONE HCL PF 1 MG/ML IJ SOLN
0.2500 mg | INTRAMUSCULAR | Status: DC | PRN
  Administered 2013-08-29 (×2): 0.5 mg via INTRAVENOUS

## 2013-08-29 MED ORDER — MIDAZOLAM HCL 2 MG/2ML IJ SOLN
1.0000 mg | INTRAMUSCULAR | Status: DC | PRN
  Administered 2013-08-29: 2 mg via INTRAVENOUS

## 2013-08-29 SURGICAL SUPPLY — 80 items
BANDAGE ELASTIC 4 VELCRO ST LF (GAUZE/BANDAGES/DRESSINGS) ×3 IMPLANT
BANDAGE ELASTIC 6 VELCRO ST LF (GAUZE/BANDAGES/DRESSINGS) ×1 IMPLANT
BANDAGE ESMARK 6X9 LF (GAUZE/BANDAGES/DRESSINGS) ×1 IMPLANT
BIT DRILL 2.5X125 (BIT) ×2 IMPLANT
BIT DRILL 3.5X125 (BIT) IMPLANT
BIT DRILL CANN 2.7 (BIT) ×2
BIT DRILL CANN 2.7MM (BIT) ×1
BIT DRILL SRG 2.7XCANN AO CPLG (BIT) IMPLANT
BIT DRL SRG 2.7XCANN AO CPLNG (BIT) ×1
BLADE SURG 15 STRL LF DISP TIS (BLADE) ×1 IMPLANT
BLADE SURG 15 STRL SS (BLADE) ×3
BNDG CMPR 9X6 STRL LF SNTH (GAUZE/BANDAGES/DRESSINGS) ×1
BNDG COHESIVE 4X5 TAN STRL (GAUZE/BANDAGES/DRESSINGS) ×2 IMPLANT
BNDG ESMARK 6X9 LF (GAUZE/BANDAGES/DRESSINGS) ×3
CHLORAPREP W/TINT 26ML (MISCELLANEOUS) ×3 IMPLANT
CLOSURE WOUND 1/2 X4 (GAUZE/BANDAGES/DRESSINGS) ×1
COVER MAYO STAND STRL (DRAPES) ×1 IMPLANT
COVER TABLE BACK 60X90 (DRAPES) ×3 IMPLANT
CUFF TOURNIQUET SINGLE 24IN (TOURNIQUET CUFF) ×2 IMPLANT
CUFF TOURNIQUET SINGLE 34IN LL (TOURNIQUET CUFF) IMPLANT
DECANTER SPIKE VIAL GLASS SM (MISCELLANEOUS) IMPLANT
DRAPE C-ARM 42X72 X-RAY (DRAPES) IMPLANT
DRAPE C-ARMOR (DRAPES) IMPLANT
DRAPE EXTREMITY T 121X128X90 (DRAPE) ×3 IMPLANT
DRAPE OEC MINIVIEW 54X84 (DRAPES) ×3 IMPLANT
DRAPE U 20/CS (DRAPES) IMPLANT
DRAPE U-SHAPE 47X51 STRL (DRAPES) ×2 IMPLANT
DRILL BIT 3.5X125 (BIT) ×3
DRSG EMULSION OIL 3X3 NADH (GAUZE/BANDAGES/DRESSINGS) ×3 IMPLANT
DURA STEPPER MED (CAST SUPPLIES) ×2 IMPLANT
ELECT REM PT RETURN 9FT ADLT (ELECTROSURGICAL) ×3
ELECTRODE REM PT RTRN 9FT ADLT (ELECTROSURGICAL) ×1 IMPLANT
GLOVE BIO SURGEON STRL SZ 6.5 (GLOVE) ×1 IMPLANT
GLOVE BIO SURGEON STRL SZ7 (GLOVE) ×2 IMPLANT
GLOVE BIO SURGEON STRL SZ7.5 (GLOVE) ×3 IMPLANT
GLOVE BIO SURGEONS STRL SZ 6.5 (GLOVE) ×1
GLOVE BIOGEL PI IND STRL 7.0 (GLOVE) IMPLANT
GLOVE BIOGEL PI IND STRL 8 (GLOVE) ×1 IMPLANT
GLOVE BIOGEL PI INDICATOR 7.0 (GLOVE) ×4
GLOVE BIOGEL PI INDICATOR 8 (GLOVE) ×2
GOWN STRL REUS W/ TWL LRG LVL3 (GOWN DISPOSABLE) ×2 IMPLANT
GOWN STRL REUS W/TWL LRG LVL3 (GOWN DISPOSABLE) ×9
K-WIRE ORTHOPEDIC 1.4X150L (WIRE) ×6
KWIRE ORTHOPEDIC 1.4X150L (WIRE) IMPLANT
NEEDLE HYPO 22GX1.5 SAFETY (NEEDLE) IMPLANT
NS IRRIG 1000ML POUR BTL (IV SOLUTION) ×3 IMPLANT
PACK BASIN DAY SURGERY FS (CUSTOM PROCEDURE TRAY) ×3 IMPLANT
PAD CAST 4YDX4 CTTN HI CHSV (CAST SUPPLIES) ×1 IMPLANT
PADDING CAST COTTON 4X4 STRL (CAST SUPPLIES)
PADDING CAST COTTON 6X4 STRL (CAST SUPPLIES) ×1 IMPLANT
PENCIL BUTTON HOLSTER BLD 10FT (ELECTRODE) ×3 IMPLANT
PLATE TUBUAL 1/3 6H (Plate) ×2 IMPLANT
SCREW CANC FT 16X4X2.5XHEX (Screw) IMPLANT
SCREW CANCELLOUS 4.0X14 (Screw) ×2 IMPLANT
SCREW CANCELLOUS 4.0X16MM (Screw) ×3 IMPLANT
SCREW CANN 4.0X50MM (Screw) ×4 IMPLANT
SCREW CORTEX ST MATTA 3.5X12MM (Screw) ×2 IMPLANT
SCREW CORTEX ST MATTA 3.5X14 (Screw) ×2 IMPLANT
SCREW CORTEX ST MATTA 3.5X16MM (Screw) ×2 IMPLANT
SCREW CORTEX ST MATTA 3.5X18MM (Screw) ×2 IMPLANT
SCREW CORTEX ST MATTA 3.5X22MM (Screw) ×2 IMPLANT
SLEEVE SCD COMPRESS KNEE MED (MISCELLANEOUS) IMPLANT
SPLINT FAST PLASTER 5X30 (CAST SUPPLIES)
SPLINT PLASTER CAST FAST 5X30 (CAST SUPPLIES) IMPLANT
SPONGE GAUZE 4X4 12PLY (GAUZE/BANDAGES/DRESSINGS) ×3 IMPLANT
SPONGE LAP 4X18 X RAY DECT (DISPOSABLE) ×3 IMPLANT
STRIP CLOSURE SKIN 1/2X4 (GAUZE/BANDAGES/DRESSINGS) ×2 IMPLANT
SUCTION FRAZIER TIP 10 FR DISP (SUCTIONS) ×2 IMPLANT
SUT MON AB 2-0 CT1 36 (SUTURE) ×3 IMPLANT
SUT MON AB 4-0 PC3 18 (SUTURE) ×3 IMPLANT
SUT VIC AB 0 SH 27 (SUTURE) ×3 IMPLANT
SUT VIC AB 2-0 SH 27 (SUTURE)
SUT VIC AB 2-0 SH 27XBRD (SUTURE) IMPLANT
SYR 20CC LL (SYRINGE) ×3 IMPLANT
SYR BULB 3OZ (MISCELLANEOUS) ×3 IMPLANT
TOWEL OR 17X24 6PK STRL BLUE (TOWEL DISPOSABLE) ×3 IMPLANT
TOWEL OR NON WOVEN STRL DISP B (DISPOSABLE) ×3 IMPLANT
TUBE CONNECTING 20'X1/4 (TUBING) ×1
TUBE CONNECTING 20X1/4 (TUBING) ×2 IMPLANT
UNDERPAD 30X30 INCONTINENT (UNDERPADS AND DIAPERS) ×3 IMPLANT

## 2013-08-29 NOTE — Discharge Instructions (Signed)
Elevate the foot as much as possible  No weight on the foot  Wear boot full time  Take an aspirin 81mg  daily.   Regional Anesthesia Blocks  1. Numbness or the inability to move the "blocked" extremity may last from 3-48 hours after placement. The length of time depends on the medication injected and your individual response to the medication. If the numbness is not going away after 48 hours, call your surgeon.  2. The extremity that is blocked will need to be protected until the numbness is gone and the  Strength has returned. Because you cannot feel it, you will need to take extra care to avoid injury. Because it may be weak, you may have difficulty moving it or using it. You may not know what position it is in without looking at it while the block is in effect.  3. For blocks in the legs and feet, returning to weight bearing and walking needs to be done carefully. You will need to wait until the numbness is entirely gone and the strength has returned. You should be able to move your leg and foot normally before you try and bear weight or walk. You will need someone to be with you when you first try to ensure you do not fall and possibly risk injury.  4. Bruising and tenderness at the needle site are common side effects and will resolve in a few days.  5. Persistent numbness or new problems with movement should be communicated to the surgeon or the [facility] [phone])/ [facility] [phone]).  Post Anesthesia Home Care Instructions  Activity: Get plenty of rest for the remainder of the day. A responsible adult should stay with you for 24 hours following the procedure.  For the next 24 hours, DO NOT: -Drive a car -Advertising copywriterperate machinery -Drink alcoholic beverages -Take any medication unless instructed by your physician -Make any legal decisions or sign important papers.  Meals: Start with liquid foods such as gelatin or soup. Progress to regular foods as  tolerated. Avoid greasy, spicy, heavy foods. If nausea and/or vomiting occur, drink only clear liquids until the nausea and/or vomiting subsides. Call your physician if vomiting continues.  Special Instructions/Symptoms: Your throat may feel dry or sore from the anesthesia or the breathing tube placed in your throat during surgery. If this causes discomfort, gargle with warm salt water. The discomfort should disappear within 24 hours.

## 2013-08-29 NOTE — Progress Notes (Signed)
Assisted Dr. Hodierne with right, ultrasound guided, popliteal block. Side rails up, monitors on throughout procedure. See vital signs in flow sheet. Tolerated Procedure well. 

## 2013-08-29 NOTE — Interval H&P Note (Signed)
History and Physical Interval Note:  08/29/2013 7:26 AM  Heather Maynard  has presented today for surgery, with the diagnosis of RIGHT BILMALLEOLAR FRACTURE  The various methods of treatment have been discussed with the patient and family. After consideration of risks, benefits and other options for treatment, the patient has consented to  Procedure(s): OPEN REDUCTION INTERNAL FIXATION (ORIFRIGHT BIMALLEOLAR  ANKLE FRACTURE (Right) as a surgical intervention .  The patient's history has been reviewed, patient examined, no change in status, stable for surgery.  I have reviewed the patient's chart and labs.  Questions were answered to the patient's satisfaction.     MURPHY, TIMOTHY, D

## 2013-08-29 NOTE — Transfer of Care (Signed)
Immediate Anesthesia Transfer of Care Note  Patient: Heather Maynard  Procedure(s) Performed: Procedure(s): OPEN REDUCTION INTERNAL FIXATION (ORIFRIGHT BIMALLEOLAR  ANKLE FRACTURE (Right)  Patient Location: PACU  Anesthesia Type:GA combined with regional for post-op pain  Level of Consciousness: awake, oriented and patient cooperative  Airway & Oxygen Therapy: Patient Spontanous Breathing and Patient connected to face mask oxygen  Post-op Assessment: Report given to PACU RN and Post -op Vital signs reviewed and stable  Post vital signs: Reviewed and stable  Complications: No apparent anesthesia complications

## 2013-08-29 NOTE — Anesthesia Procedure Notes (Addendum)
Anesthesia Regional Block:  Popliteal block  Pre-Anesthetic Checklist: ,, timeout performed, Correct Patient, Correct Site, Correct Laterality, Correct Procedure, Correct Position, site marked, Risks and benefits discussed,  Surgical consent,  Pre-op evaluation,  At surgeon's request and post-op pain management  Laterality: Right  Prep: chloraprep       Needles:  Injection technique: Single-shot  Needle Type: Echogenic Stimulator Needle      Needle Gauge: 21 and 21 G    Additional Needles:  Procedures: ultrasound guided (picture in chart) and nerve stimulator Popliteal block  Nerve Stimulator or Paresthesia:  Response: plantar flexion of foot, 0.45 mA,   Additional Responses:   Narrative:  Start time: 08/29/2013 10:12 AM End time: 08/29/2013 10:27 AM Injection made incrementally with aspirations every 5 mL.  Performed by: Personally  Anesthesiologist: Dr Chaney MallingHodierne  Additional Notes: Functioning IV was confirmed and monitors were applied.  A 90mm 21ga Arrow echogenic stimulator needle was used. Sterile prep and drape,hand hygiene and sterile gloves were used.  Negative aspiration and negative test dose prior to incremental administration of local anesthetic. The patient tolerated the procedure well.  Ultrasound guidance: relevent anatomy identified, needle position confirmed, local anesthetic spread visualized around nerve(s), vascular puncture avoided.  Image printed for medical record.    Procedure Name: LMA Insertion Date/Time: 08/29/2013 11:33 AM Performed by: Gar GibbonKEETON, Tobin Witucki S Pre-anesthesia Checklist: Patient identified, Emergency Drugs available, Suction available and Patient being monitored Patient Re-evaluated:Patient Re-evaluated prior to inductionOxygen Delivery Method: Circle System Utilized Preoxygenation: Pre-oxygenation with 100% oxygen Intubation Type: IV induction Ventilation: Mask ventilation without difficulty LMA: LMA inserted LMA Size: 4.0 Number of  attempts: 1 Airway Equipment and Method: bite block Placement Confirmation: positive ETCO2 Tube secured with: Tape Dental Injury: Teeth and Oropharynx as per pre-operative assessment

## 2013-08-29 NOTE — Anesthesia Preprocedure Evaluation (Signed)
Anesthesia Evaluation  Patient identified by MRN, date of birth, ID band Patient awake    Reviewed: Allergy & Precautions, H&P , NPO status , Patient's Chart, lab work & pertinent test results  Airway Mallampati: II  Neck ROM: full    Dental   Pulmonary neg pulmonary ROS,          Cardiovascular negative cardio ROS      Neuro/Psych Anxiety    GI/Hepatic GERD-  ,  Endo/Other    Renal/GU      Musculoskeletal   Abdominal   Peds  Hematology   Anesthesia Other Findings   Reproductive/Obstetrics                           Anesthesia Physical Anesthesia Plan  ASA: II  Anesthesia Plan: General and Regional   Post-op Pain Management: MAC Combined w/ Regional for Post-op pain   Induction: Intravenous  Airway Management Planned: LMA  Additional Equipment:   Intra-op Plan:   Post-operative Plan:   Informed Consent: I have reviewed the patients History and Physical, chart, labs and discussed the procedure including the risks, benefits and alternatives for the proposed anesthesia with the patient or authorized representative who has indicated his/her understanding and acceptance.     Plan Discussed with: CRNA, Anesthesiologist and Surgeon  Anesthesia Plan Comments:         Anesthesia Quick Evaluation

## 2013-08-29 NOTE — H&P (Signed)
      ORTHOPAEDIC CONSULTATION  REQUESTING PHYSICIAN: Renette Butters, MD  Chief Complaint: R ankle fracture  HPI: Heather Maynard is a 33 y.o. female who complains of  Slipped and fell  Past Medical History  Diagnosis Date  . Anxiety     panic attacks  . Acid indigestion     OTC as needed  . Bimalleolar ankle fracture 08/24/2013    right   Past Surgical History  Procedure Laterality Date  . Laparoscopic tubal ligation Bilateral 08/01/2013    Procedure: LAPAROSCOPIC TUBAL LIGATION WITH FILSHIE CLIPS;  Surgeon: Margarette Asal, MD;  Location: [facility];  Service: Gynecology;  Laterality: Bilateral;   History   Social History  . Marital Status: Married    Spouse Name: N/A    Number of Children: N/A  . Years of Education: N/A   Social History Main Topics  . Smoking status: Never Smoker   . Smokeless tobacco: Never Used  . Alcohol Use: No  . Drug Use: No  . Sexual Activity: Yes   Other Topics Concern  . None   Social History Narrative  . None   Family History  Problem Relation Age of Onset  . Hypertension Father   . Diabetes Father     insulin  . Heart disease Maternal Grandmother   . Hypertension Maternal Grandmother   . Heart disease Paternal Grandmother   . Hypertension Paternal Grandmother   . Diabetes Paternal Grandfather     insulin   Allergies  Allergen Reactions  . Soap Itching and Other (See Comments)    SKIN IRRITATION   Prior to Admission medications   Medication Sig Start Date End Date Taking? Authorizing Provider  HYDROcodone-acetaminophen (NORCO) 10-325 MG per tablet Take 1 tablet by mouth every 6 (six) hours as needed.   Yes Historical Provider, MD  omeprazole (PRILOSEC) 20 MG capsule Take 20 mg by mouth daily.   Yes Historical Provider, MD  oxyCODONE-acetaminophen (PERCOCET/ROXICET) 5-325 MG per tablet Take 2 tablets by mouth every 4 (four) hours as needed for severe pain. 08/24/13  Yes Domenic Moras, PA-C   No results found.  Positive  ROS: All other systems have been reviewed and were otherwise negative with the exception of those mentioned in the HPI and as above.  Labs cbc No results found for this basename: WBC, HGB, HCT, PLT,  in the last 72 hours  Labs inflam No results found for this basename: ESR, CRP,  in the last 72 hours  Labs coag No results found for this basename: INR, PT, PTT,  in the last 72 hours  No results found for this basename: NA, K, CL, CO2, GLUCOSE, BUN, CREATININE, CALCIUM,  in the last 72 hours  Physical Exam: There were no vitals filed for this visit. General: Alert, no acute distress Cardiovascular: No pedal edema Respiratory: No cyanosis, no use of accessory musculature GI: No organomegaly, abdomen is soft and non-tender Skin: No lesions in the area of chief complaint Neurologic: Sensation intact distally Psychiatric: Patient is competent for consent with normal mood and affect Lymphatic: No axillary or cervical lymphadenopathy  MUSCULOSKELETAL:  Skin benign, NVI Other extremities are atraumatic with painless ROM and NVI.  Assessment: R bimal  Plan: ORIF    Edmonia Lynch, D, MD Cell [phone]   08/29/2013 7:26 AM

## 2013-08-29 NOTE — Op Note (Signed)
08/29/2013  1:24 PM  PATIENT:  Heather Maynard    PRE-OPERATIVE DIAGNOSIS:  RIGHT BILMALLEOLAR FRACTURE  POST-OPERATIVE DIAGNOSIS:  Same  PROCEDURE:  OPEN REDUCTION INTERNAL FIXATION (ORIFRIGHT BIMALLEOLAR  ANKLE FRACTURE  SURGEON:  Tremane Spurgeon, D, MD  ASSISTANT: None  ANESTHESIA:   Gen  PREOPERATIVE INDICATIONS:  [name] is a  33 y.o. female with a diagnosis of RIGHT BILMALLEOLAR FRACTURE who failed conservative measures and elected for surgical management.    The risks benefits and alternatives were discussed with the patient preoperatively including but not limited to the risks of infection, bleeding, nerve injury, cardiopulmonary complications, the need for revision surgery, among others, and the patient was willing to proceed.  OPERATIVE IMPLANTS: [employer]  OPERATIVE FINDINGS: Unstable ankle fracture. Stable syndesmosis post op  BLOOD LOSS: min  COMPLICATIONS: none  TOURNIQUET TIME: 60  OPERATIVE PROCEDURE:  Patient was identified in the preoperative holding area and site was marked by me He was transported to the operating theater and placed on the table in supine position taking care to pad all bony prominences. After a preincinduction time out anesthesia was induced. The right lower extremity was prepped and draped in normal sterile fashion and a pre-incision timeout was performed. Heather Donathiffany B Truss received ancef for preoperative antibiotics.   I made a lateral incision of roughly 7 cm dissection was carried down sharply to the distal fibula and then spreading dissection was used proximally to protect the superficial peroneal nerve. I sharply incised the periosteum and took care to protect the peroneal tendons. I then debrided the fracture site and performed a reduction maneuver which was held in place with a clamp.   I placed a lag screw across the fracture  I then selected a 6-hole one third tubular plate and placed in a neutralization fashion care was  taken distally so as not to penetrate the joint with the cancellus screws.  I then turned my attention medially where I created a 4 cm incision and dissected sharply down to the medial Mal fracture taking care to protect the saphenous vein. I debrided the fracture and reduced and held in place with a tenaculum. I then drilled and placed 2 partially threaded 50 mm cannulated screws one anterior and one posterior across the fracture.  I then stressed the syndesmosis and it was stable  The wound was then thoroughly irrigated and closed using a 0 Vicryl and absorbable Monocryl sutures. He was placed in a short leg splint.   POST OPERATIVE PLAN: Non-weightbearing. DVT prophylaxis will consist of ambulation and ASA 81 daily for 30days

## 2013-08-29 NOTE — Anesthesia Postprocedure Evaluation (Signed)
Anesthesia Post Note  Patient: Heather Maynard  Procedure(s) Performed: Procedure(s) (LRB): OPEN REDUCTION INTERNAL FIXATION (ORIFRIGHT BIMALLEOLAR  ANKLE FRACTURE (Right)  Anesthesia type: General  Patient location: PACU  Post pain: Pain level controlled and Adequate analgesia  Post assessment: Post-op Vital signs reviewed, Patient's Cardiovascular Status Stable, Respiratory Function Stable, Patent Airway and Pain level controlled  Last Vitals:  Filed Vitals:   08/29/13 1412  BP:   Pulse: 104  Temp:   Resp: 13    Post vital signs: Reviewed and stable  Level of consciousness: awake, alert  and oriented  Complications: No apparent anesthesia complications

## 2013-08-30 LAB — POCT HEMOGLOBIN-HEMACUE: HEMOGLOBIN: 13.6 g/dL (ref 12.0–15.0)

## 2013-09-05 ENCOUNTER — Ambulatory Visit (INDEPENDENT_AMBULATORY_CARE_PROVIDER_SITE_OTHER): Payer: 59 | Admitting: Medical

## 2013-09-05 ENCOUNTER — Encounter: Payer: Self-pay | Admitting: Medical

## 2013-09-05 VITALS — BP 132/80 | HR 100 | Temp 98.0°F | Resp 18 | Wt 175.0 lb

## 2013-09-05 DIAGNOSIS — F410 Panic disorder [episodic paroxysmal anxiety] without agoraphobia: Secondary | ICD-10-CM

## 2013-09-05 DIAGNOSIS — S82843A Displaced bimalleolar fracture of unspecified lower leg, initial encounter for closed fracture: Secondary | ICD-10-CM

## 2013-09-05 DIAGNOSIS — F411 Generalized anxiety disorder: Secondary | ICD-10-CM

## 2013-09-05 MED ORDER — ALPRAZOLAM 0.5 MG PO TABS: 0.5000 mg | ORAL_TABLET | Freq: Every evening | ORAL | Status: DC | PRN

## 2013-09-05 MED ORDER — SERTRALINE HCL 50 MG PO TABS: 50.0000 mg | ORAL_TABLET | Freq: Every day | ORAL | Status: DC

## 2013-09-05 NOTE — Progress Notes (Signed)
Subjective: Is a patient here but hasn't been here since 2011.  Has primarily been seeing gynecology.   Here to discuss anxiety.  She broke her right foot 1/15, had surgery subsequently, had tib/fib fracture.  Been seeing Dr. Eulah Pont ortho for this.  Has been on oxycodone, but this made her feel odd.  Has stopped this. Still in a CAM walker. 2 days after surgery, started having a lot of chest pain, SOB, tunnel vision, the worst panic attack of her life.  Has a history of having panic attacks and anxiety in 2004, shortly after the birth of her daughter.   She notes that anxiety runs in her family - father, sister.  Since last week, can't get rid of anxiety.  Has had 2 other significant panic attacks.  Called 911 the night of the severe panic attack early last week.  BP was elevated, she eventually calmed down, and ultimately didn't go to the ED.  had an EKG that was reportedly normal.   Back in 2004 was put on medication, xanax and Zoloft initially.  Was titrated up to 100mg  Zoloft, was on this for several years and did fine on this.  Came off zoloft with second pregnancy.   Had been doing relatively well, although she does admit having some low level anxiety even in recent years.  Currently is immobile, frustrated, given the recent fracture.  Works full time in [employer], sports for [employer].  Married.  Lives at home with husband and her 3 children.  Has 11yo, 9yo, and 2yo.  relationship with husband is good.  Was going to start exercising prior to the fracture.  Of note, had tubal ligation late last year. She denies any other particular triggers of her stress or anxiety.    Past Medical History  Diagnosis Date  . Anxiety     panic attacks  . Acid indigestion     OTC as needed  . Bimalleolar ankle fracture 08/24/2013    right   Review of systems as in subjective   Past Medical History  Diagnosis Date  . Anxiety     panic attacks  . Acid indigestion     OTC as needed  . Bimalleolar ankle  fracture 08/24/2013    right   ROS as in subjective  Objective: Filed Vitals:   09/05/13 1037  BP: 132/80  Pulse: 100  Temp: 98 F (36.7 C)  Resp: 18    General appearance: alert, no distress, WD/WN Neck: supple, no lymphadenopathy, no thyromegaly, no masses Heart: RRR, normal S1, S2, no murmurs Lungs: CTA bilaterally, no wheezes, rhonchi, or rales Pulses: 2+ symmetric, upper and lower extremities, normal cap refill Extremities: no edema, no cyanosis, no clubbing Neuro: Nonfocal Psych: Pleasant, good eye contact, answers questions appropriately   Assessment: Encounter Diagnoses  Name Primary?  Marland Kitchen Anxiety state, unspecified Yes  . Panic attack   . Bimalleolar ankle fracture     Plan: We discussed her concerns and questions.  Reviewed her old chart and she has prior diagnosis of anxiety and panic attacks, was on Zoloft for an extended period time, Xanax short-term.  Briefly she had been tried Wellbutrin Lexapro and remote past.  We will go ahead and start her back on Zoloft, short-term use of Xanax given.  We had a long discussion about anxiety, stress reduction, strategies to deal with anxiety and panic attacks.  Recommend she establish with a counselor. During her prior multiyear treatment for anxiety she never sought counseling. She has  a strong family history of anxiety. Follow-up 2 to 4 weeks, or return sooner when necessary  She will have followup with orthopedist, but recommended additional evaluation given the unusual circumstance regarding her fracture.  She will go ahead and schedule a physical and fasting labs.

## 2013-09-05 NOTE — Patient Instructions (Addendum)
Begin back on Zoloft 50mg .  Start 1/2 tablet daily the first 5-7 days, then go to 1 tablet daily.    Use the Xanax as needed sparingly.    Establish with counseling.    Work on stress reduction where possible.   RESOURCES in [place], [place] Psychiatry and Counseling services   Dr. Andee Poles, psychiatry [phone] office [email or web address] [address], [address] [place], [place], [place] [address] Dr. Andee Poles Valinda Hoar, NP Grayland Ormond, NP  Anxiety, Depression, ADHD, OCD, Eating Disorders, Bipolar, other   [facility] [phone] office [email or web address] [address] [place], [place] [address]  Dr. Lynden Ang, psychiatry services  Dr. Bennie Dallas  Depression, Anxiety, Substance Abuse, Couples Issues, Adolescent Issues Oneta Rack, NP Depression, Anxiety, ADHD, Women's issues, Bipolar Disorder, Substance   Abuse Saul Fordyce, NP Depression, Anxiety, Aging, ADHD, Bipolar Disorder, Substance Abuse Manuela Neptune, NP  Mood disturbances, ADHD, children, adolescents, adults Geronimo Running, Therapist Sexual Addiction, Bipolar, Depression, Anxiety, Substance Addiction Shaaron Adler, Therapist Grief and Loss, Anxiety, Depression, Bipolar, Medical Challenges, Life    Transitions Michaelle Copas, Therapist Substance Abuse, Relationships, Clergy Families, Anger and Stress Management, Postpartum Depression, Pre-Marital Counseling Rochele Raring, Therapist [place]sim, Anxiety, Depression, ADHD, Adjustment Disorder, PTSD, Grief and Loss, Divorce, Adoption Concerns   [facility] [phone] office [email or web address] [address], [address] [place], [place], [place] [address]  Franchot Erichsen, MA, clinical psychologist  Cognitive-Behavior Therapy; Mood Disorders; Anxiety Disorders; adult and child ADHD; Family Therapy; Stress Management; personal growth, and Marital Therapy.     Carlus Pavlov Ph.D., clinical psychologist Cognitive-Behavior Therapy; Mood Disorders; Anxiety Disorders; Stress     Management   Miguel Aschoff Ph.D., clinical psychologist [phone] office [address] [place], [place] [address] Cognitive Behavior Therapy, Depression, Bipolar, Anxiety, Grief and Loss   [email or web address] [phone] office [address], [place], [place] [address]  Veneda Melter, Ph.D., [facility] Family, Couples, Anxiety, Depression, ADHD, Abuse, Anger Management Sherie Don, M.Ed., LPC Couples, Sexual orientation, Domestic violence, Child Abuse, Major Life Change,  Depression Leandra Kern, [place] Marriage counseling, Women's Issues, Depression, Intimacy, Career Issues Madelaine Etienne, Ph.D.,  LPC PTSD, Addictions, Grief, Anxiety, Sexual Orientation Reather Laurence, [facility] Teen and child depression, anxiety, parenting challenges, Adult depression, self injury, relationship issues. Maple Hudson, [facility] Addiction, PTSD, Eating Disorders, Depression, Sexual Orientation Daun Peacock, LPC Eating disorder, Anxiety and Depression, Grief, Divorce, Couples and Family Counseling, Parenting  Generalized Anxiety Disorder Generalized anxiety disorder (GAD) is a mental disorder. It interferes with life functions, including relationships, work, and school. GAD is different from normal anxiety, which everyone experiences at some point in their lives in response to specific life events and activities. Normal anxiety actually helps [place] prepare for and get through these life events and activities. Normal anxiety goes away after the event or activity is over.  GAD causes anxiety that is not necessarily related to specific events or activities. It also causes excess anxiety in proportion to specific events or activities. The anxiety associated with GAD is also difficult to control. GAD can vary from mild to severe. People with  severe GAD can have intense waves of anxiety with physical symptoms (panic attacks).  SYMPTOMS The anxiety and worry associated with GAD are difficult to control. This anxiety and worry are related to many life events and activities and also occur more days than not for 6 months or longer. People with GAD also have three or more of the following symptoms (one or more  in children):  Restlessness.   Fatigue.  Difficulty concentrating.   Irritability.  Muscle tension.  Difficulty sleeping or unsatisfying sleep. DIAGNOSIS GAD is diagnosed through an assessment by your caregiver. Your caregiver will ask you questions aboutyour mood,physical symptoms, and events in your life. Your caregiver may ask you about your medical history and use of alcohol or drugs, including prescription medications. Your caregiver may also do a physical exam and blood tests. Certain medical conditions and the use of certain substances can cause symptoms similar to those associated with GAD. Your caregiver may refer you to a mental health specialist for further evaluation. TREATMENT The following therapies are usually used to treat GAD:   Medication Antidepressant medication usually is prescribed for long-term daily control. Antianxiety medications may be added in severe cases, especially when panic attacks occur.   Talk therapy (psychotherapy) Certain types of talk therapy can be helpful in treating GAD by providing support, education, and guidance. A form of talk therapy called cognitive behavioral therapy can teach you healthy ways to think about and react to daily life events and activities.  Stress managementtechniques These include yoga, meditation, and exercise and can be very helpful when they are practiced regularly. A mental health specialist can help determine which treatment is best for you. Some people see improvement with one therapy. However, other people require a combination of therapies. Document  Released: 11/21/2012 Document Reviewed: 11/21/2012 [facility] Patient Information 2014 [place], [place].   Panic Attacks Panic attacks are sudden, short-livedsurges of severe anxiety, fear, or discomfort. They may occur for no reason when you are relaxed, when you are anxious, or when you are sleeping. Panic attacks may occur for a number of reasons:   Healthy people occasionally have panic attacks in extreme, life-threatening situations, such as war or natural disasters. Normal anxiety is a protective mechanism of the body that helps [place] react to danger (fight or flight response).  Panic attacks are often seen with anxiety disorders, such as panic disorder, social anxiety disorder, generalized anxiety disorder, and phobias. Anxiety disorders cause excessive or uncontrollable anxiety. They may interfere with your relationships or other life activities.  Panic attacks are sometimes seen with other mental illnesses such as depression and posttraumatic stress disorder.  Certain medical conditions, prescription medicines, and drugs of abuse can cause panic attacks. SYMPTOMS  Panic attacks start suddenly, peak within 20 minutes, and are accompanied by four or more of the following symptoms:  Pounding heart or fast heart rate (palpitations).  Sweating.  Trembling or shaking.  Shortness of breath or feeling smothered.  Feeling choked.  Chest pain or discomfort.  Nausea or strange feeling in your stomach.  Dizziness, lightheadedness, or feeling like you will faint.  Chills or hot flushes.  Numbness or tingling in your lips or hands and feet.  Feeling that things are not real or feeling that you are not yourself.  Fear of losing control or going crazy.  Fear of dying. Some of these symptoms can mimic serious medical conditions. For example, you may think you are having a heart attack. Although panic attacks can be very scary, they are not life threatening. DIAGNOSIS  Panic attacks are  diagnosed through an assessment by your health care provider. Your health care provider will ask questions about your symptoms, such as where and when they occurred. Your health care provider will also ask about your medical history and use of alcohol and drugs, including prescription medicines. Your health care provider may order blood tests or other  studies to rule out a serious medical condition. Your health care provider may refer you to a mental health professional for further evaluation. TREATMENT   Most healthy people who have one or two panic attacks in an extreme, life-threatening situation will not require treatment.  The treatment for panic attacks associated with anxiety disorders or other mental illness typically involves counseling with a mental health professional, medicine, or a combination of both. Your health care provider will help determine what treatment is best for you.  Panic attacks due to physical illness usually goes away with treatment of the illness. If prescription medicine is causing panic attacks, talk with your health care provider about stopping the medicine, decreasing the dose, or substituting another medicine.  Panic attacks due to alcohol or drug abuse goes away with abstinence. Some adults need professional help in order to stop drinking or using drugs. HOME CARE INSTRUCTIONS   Take all your medicines as prescribed.   Check with your health care provider before starting new prescription or over-the-counter medicines.  Keep all follow up appointments with your health care provider. SEEK MEDICAL CARE IF:  You are not able to take your medicines as prescribed.  Your symptoms do not improve or get worse. SEEK IMMEDIATE MEDICAL CARE IF:   You experience panic attack symptoms that are different than your usual symptoms.  You have serious thoughts about hurting yourself or others.  You are taking medicine for panic attacks and have a serious side  effect. MAKE SURE YOU:  Understand these instructions.  Will watch your condition.  Will get help right away if you are not doing well or get worse. Document Released: 07/27/2005 Document Revised: 05/17/2013 Document Reviewed: 03/10/2013 [facility] Patient Information 2014 [place].

## 2013-09-07 ENCOUNTER — Encounter (HOSPITAL_BASED_OUTPATIENT_CLINIC_OR_DEPARTMENT_OTHER): Payer: Self-pay | Admitting: Orthopedic Surgery

## 2013-09-25 ENCOUNTER — Ambulatory Visit (INDEPENDENT_AMBULATORY_CARE_PROVIDER_SITE_OTHER): Payer: 59 | Admitting: Medical

## 2013-09-25 ENCOUNTER — Encounter: Payer: Self-pay | Admitting: Medical

## 2013-09-25 VITALS — BP 122/80 | HR 78 | Temp 98.0°F | Resp 16 | Ht 65.0 in | Wt 175.0 lb

## 2013-09-25 DIAGNOSIS — F410 Panic disorder [episodic paroxysmal anxiety] without agoraphobia: Secondary | ICD-10-CM

## 2013-09-25 DIAGNOSIS — F411 Generalized anxiety disorder: Secondary | ICD-10-CM

## 2013-09-25 DIAGNOSIS — Z0000 Encounter for general adult medical examination without abnormal findings: Secondary | ICD-10-CM

## 2013-09-25 DIAGNOSIS — R0600 Dyspnea, unspecified: Secondary | ICD-10-CM

## 2013-09-25 DIAGNOSIS — S82843A Displaced bimalleolar fracture of unspecified lower leg, initial encounter for closed fracture: Secondary | ICD-10-CM

## 2013-09-25 DIAGNOSIS — R0989 Other specified symptoms and signs involving the circulatory and respiratory systems: Secondary | ICD-10-CM

## 2013-09-25 DIAGNOSIS — R0609 Other forms of dyspnea: Secondary | ICD-10-CM

## 2013-09-25 LAB — POCT URINALYSIS DIPSTICK
Bilirubin, UA: NEGATIVE
Blood, UA: NEGATIVE
GLUCOSE UA: NEGATIVE
Ketones, UA: NEGATIVE
Leukocytes, UA: NEGATIVE
NITRITE UA: NEGATIVE
Protein, UA: NEGATIVE
SPEC GRAV UA: 1.02
Urobilinogen, UA: NEGATIVE
pH, UA: 5

## 2013-09-25 MED ORDER — SERTRALINE HCL 100 MG PO TABS: 100.0000 mg | ORAL_TABLET | Freq: Every day | ORAL | Status: DC

## 2013-09-25 MED ORDER — ALPRAZOLAM 0.5 MG PO TABS: 0.5000 mg | ORAL_TABLET | Freq: Every evening | ORAL | Status: DC | PRN

## 2013-09-25 NOTE — Progress Notes (Signed)
Subjective:   HPI  Heather Maynard is a 33 y.o. female who presents for a complete physical.  Preventative care: Last ophthalmology visit:yes- [facility] last exam was 4 2014 Last dental visit:Yes Changing dentist Medical care team includes: Orthopedist - Dr. Eulah Pont Last colonoscopy:n/a Last mammogram:n/a Last gynecological exam:06/2013 [facility], pap up to date Last EKG:04/2009 Last labs:2013  Prior vaccinations: TD or Tdap:2012 Influenza:05/2013 Pneumococcal:n/a Shingles/Zostavax:n/a  Advanced directive:n/a Health care power of attorney:n/a Living will:n/a  Concerns: Recheck on anxiety, panic attacks.  Since last visit some improvement Zoloft 50mg , but still having panic attacks, getting hung up dwelling on wether or not she can take a deep breath. hasn't seen counseling yet.  Reviewed their medical, surgical, family, social, medication, and allergy history and updated chart as appropriate.  Past Medical History  Diagnosis Date  . Anxiety     panic attacks  . Acid indigestion     OTC as needed  . Bimalleolar ankle fracture 08/24/2013    right  . Wears contact lenses   . Headache     Past Surgical History  Procedure Laterality Date  . Laparoscopic tubal ligation Bilateral 08/01/2013    Procedure: LAPAROSCOPIC TUBAL LIGATION WITH FILSHIE CLIPS;  Surgeon: Meriel Pica, MD;  Location: [facility];  Service: Gynecology;  Laterality: Bilateral;  . Orif ankle fracture Right 08/29/2013    Procedure: OPEN REDUCTION INTERNAL FIXATION (ORIFRIGHT BIMALLEOLAR  ANKLE FRACTURE;  Surgeon: Sheral Apley, MD;  Location: [facility];  Service: Orthopedics;  Laterality: Right;  . Tubal ligation  08/01/2013    History   Social History  . Marital Status: Married    Spouse Name: N/A    Number of Children: N/A  . Years of Education: N/A   Occupational History  . Not on file.   Social History Main Topics  . Smoking status: Never Smoker   .  Smokeless tobacco: Never Used  . Alcohol Use: No  . Drug Use: No  . Sexual Activity: Yes   Other Topics Concern  . Not on file   Social History Narrative   Married, 3 daughters, ages 9yo, [place], and [place], walking for exercise    Family History  Problem Relation Age of Onset  . Hypertension Father   . Diabetes Father     insulin  . Heart disease Maternal Grandmother   . Hypertension Maternal Grandmother   . Heart disease Paternal Grandmother   . Hypertension Paternal Grandmother   . Diabetes Paternal Grandfather     insulin  . Anxiety disorder Sister     Current outpatient prescriptions:ALPRAZolam (XANAX) 0.5 MG tablet, Take 1 tablet (0.5 mg total) by mouth at bedtime as needed for anxiety., Disp: 20 tablet, Rfl: 0;  omeprazole (PRILOSEC) 20 MG capsule, Take 20 mg by mouth daily., Disp: , Rfl: ;  sertraline (ZOLOFT) 50 MG tablet, Take 1 tablet (50 mg total) by mouth daily., Disp: 30 tablet, Rfl: 1;  aspirin 81 MG tablet, Take 81 mg by mouth daily., Disp: , Rfl:   Allergies  Allergen Reactions  . Soap Itching and Other (See Comments)    SKIN IRRITATION     Review of Systems Constitutional: -fever, -chills, -sweats, -unexpected weight change, -decreased appetite, -fatigue Allergy: -sneezing, -itching, -congestion Dermatology: -changing moles, --rash, -lumps ENT: -runny nose, -ear pain, -sore throat, -hoarseness, -sinus pain, -teeth pain, - ringing in ears, -hearing loss, -nosebleeds Cardiology: -chest pain, -palpitations, -swelling, -difficulty breathing when lying flat, -waking up short of breath Respiratory: -  cough, -shortness of breath, -difficulty breathing with exercise or exertion, -wheezing, -coughing up blood Gastroenterology: -abdominal pain, -nausea, -vomiting, -diarrhea, -constipation, -blood in stool, -changes in bowel movement, -difficulty swallowing or eating Hematology: -bleeding, -bruising  Musculoskeletal: -joint aches, -muscle aches, -joint swelling, -back  pain, -neck pain, -cramping, -changes in gait Ophthalmology: denies vision changes, eye redness, itching, discharge Urology: -burning with urination, -difficulty urinating, -blood in urine, -urinary frequency, -urgency, -incontinence Neurology: -headache, -weakness, -tingling, -numbness, -memory loss, -falls, -dizziness Psychology: -depressed mood, -agitation, -sleep problems     Objective:   Physical Exam  BP 122/80  Pulse 78  Temp(Src) 98 F (36.7 C) (Oral)  Resp 16  Ht 5\' 5"  (1.651 m)  Wt 175 lb (79.379 kg)  BMI 29.12 kg/m2  General appearance: alert, no distress, WD/WN, white female Skin: mild acne, few scattered macules, no worrisome lesions HEENT: normocephalic, conjunctiva/corneas normal, sclerae anicteric, PERRLA, EOMi, nares patent, no discharge or erythema, pharynx normal Oral cavity: MMM, tongue normal, teeth in good repair Neck: supple, no lymphadenopathy, no thyromegaly, no masses, normal ROM, no bruit Chest: non tender, normal shape and expansion Heart: RRR, normal S1, S2, no murmurs Lungs: CTA bilaterally, no wheezes, rhonchi, or rales Abdomen: +bs, soft, non tender, non distended, no masses, no hepatomegaly, no splenomegaly, no bruits Back: non tender, normal ROM, no scoliosis Musculoskeletal: right lower leg in orthopedic boot, otherwise upper extremities non tender, no obvious deformity, normal ROM throughout, lower extremities non tender, no obvious deformity, normal ROM throughout Extremities: no edema, no cyanosis, no clubbing Pulses: 2+ symmetric, upper and lower extremities, normal cap refill Neurological: alert, oriented x 3, CN2-12 intact, strength normal upper extremities and lower extremities, sensation normal throughout, DTRs 2+ throughout, no cerebellar signs, gait normal Psychiatric: normal affect, behavior normal, pleasant  Breast/gyn/rectal - deferred to gynecology   Assessment and Plan :    Encounter Diagnoses  Name Primary?  . Routine general  medical examination at a health care facility Yes  . Generalized anxiety disorder   . Panic attack   . Bimalleolar fracture      Physical exam - discussed healthy lifestyle, diet, exercise, preventative care, vaccinations, and addressed their concerns.  Handout given. Anxiety, panic - increase to Zoloft 100mg , c/t Xanax prn.  discussed anxiety, coping strategies. See counseling.  Fracture - being managed by orthopedist. Follow-up this week for fasting labs

## 2013-09-28 ENCOUNTER — Other Ambulatory Visit: Payer: 59

## 2013-09-28 DIAGNOSIS — S82843A Displaced bimalleolar fracture of unspecified lower leg, initial encounter for closed fracture: Secondary | ICD-10-CM

## 2013-09-28 DIAGNOSIS — R0600 Dyspnea, unspecified: Secondary | ICD-10-CM

## 2013-09-28 DIAGNOSIS — Z0000 Encounter for general adult medical examination without abnormal findings: Secondary | ICD-10-CM

## 2013-09-28 LAB — COMPREHENSIVE METABOLIC PANEL
ALK PHOS: 85 U/L (ref 39–117)
ALT: 15 U/L (ref 0–35)
AST: 17 U/L (ref 0–37)
Albumin: 4.4 g/dL (ref 3.5–5.2)
BILIRUBIN TOTAL: 0.4 mg/dL (ref 0.2–1.2)
BUN: 9 mg/dL (ref 6–23)
CO2: 25 mEq/L (ref 19–32)
Calcium: 9.6 mg/dL (ref 8.4–10.5)
Chloride: 103 mEq/L (ref 96–112)
Creat: 0.55 mg/dL (ref 0.50–1.10)
Glucose, Bld: 86 mg/dL (ref 70–99)
Potassium: 4.1 mEq/L (ref 3.5–5.3)
SODIUM: 137 meq/L (ref 135–145)
Total Protein: 7.2 g/dL (ref 6.0–8.3)

## 2013-09-28 LAB — LIPID PANEL
CHOL/HDL RATIO: 3.4 ratio
Cholesterol: 158 mg/dL (ref 0–200)
HDL: 46 mg/dL (ref >39)
LDL CALC: 97 mg/dL (ref 0–99)
Triglycerides: 77 mg/dL (ref <150)
VLDL: 15 mg/dL (ref 0–40)

## 2013-09-28 LAB — CBC
HCT: 36.2 % (ref 36.0–46.0)
Hemoglobin: 12.1 g/dL (ref 12.0–15.0)
MCH: 27.8 pg (ref 26.0–34.0)
MCHC: 33.4 g/dL (ref 30.0–36.0)
MCV: 83 fL (ref 78.0–100.0)
PLATELETS: 341 10*3/uL (ref 150–400)
RBC: 4.36 MIL/uL (ref 3.87–5.11)
RDW: 14.6 % (ref 11.5–15.5)
WBC: 7.4 10*3/uL (ref 4.0–10.5)

## 2013-09-28 LAB — LACTATE DEHYDROGENASE: LDH: 146 U/L (ref 94–250)

## 2013-09-28 LAB — TSH: TSH: 3.974 u[IU]/mL (ref 0.350–4.500)

## 2013-09-28 LAB — PHOSPHORUS: PHOSPHORUS: 3.2 mg/dL (ref 2.3–4.6)

## 2013-09-29 LAB — VITAMIN D 25 HYDROXY (VIT D DEFICIENCY, FRACTURES): VIT D 25 HYDROXY: 37 ng/mL (ref 30–89)

## 2014-01-05 ENCOUNTER — Ambulatory Visit (INDEPENDENT_AMBULATORY_CARE_PROVIDER_SITE_OTHER): Payer: 59 | Admitting: Medical

## 2014-01-05 ENCOUNTER — Encounter: Payer: Self-pay | Admitting: Medical

## 2014-01-05 ENCOUNTER — Telehealth: Payer: Self-pay | Admitting: Medical

## 2014-01-05 VITALS — BP 100/70 | HR 78 | Temp 98.6°F | Resp 14 | Wt 178.0 lb

## 2014-01-05 DIAGNOSIS — R002 Palpitations: Secondary | ICD-10-CM

## 2014-01-05 DIAGNOSIS — F411 Generalized anxiety disorder: Secondary | ICD-10-CM

## 2014-01-05 DIAGNOSIS — K219 Gastro-esophageal reflux disease without esophagitis: Secondary | ICD-10-CM

## 2014-01-05 MED ORDER — OMEPRAZOLE 40 MG PO CPDR: 40.0000 mg | DELAYED_RELEASE_CAPSULE | Freq: Every day | ORAL | Status: DC

## 2014-01-05 MED ORDER — SERTRALINE HCL 100 MG PO TABS: 100.0000 mg | ORAL_TABLET | Freq: Every day | ORAL | Status: DC

## 2014-01-05 MED ORDER — ALPRAZOLAM 0.5 MG PO TABS: 0.5000 mg | ORAL_TABLET | Freq: Every evening | ORAL | Status: DC | PRN

## 2014-01-05 NOTE — Telephone Encounter (Signed)
Please refer to counselor  Marjie Skiff for anxiety

## 2014-01-05 NOTE — Progress Notes (Signed)
Subjective: Here for recheck on anxiety.  The past few weeks been having some issues.  Has been having some twitches, more when she is relaxed.  Hands and feet will twitch at times.  Started having that same chest discomfort she had before with anxiety, but also having indigestion.  Still using Zoloft 100mg  daily.   Rarely has to use the Xanax.  Took 3 within the past 2 wk, but only if she feels she needs them, mainly at night to help relax.  Has been released from ortho from foot fracture, has been doing home therapy, can drive now, no longer has the boot.  Is back to work.  Been having some palpations, at least once weekly, at times feels dizzy.  Had one bad episode in the past 2 wk.  Felt like her heart beat stopped.  Nonsmoker.  Rarely drinks caffeine.  Hasn't seen counseling, but would like to pursue this.    No other aggravating or relieving factors.  No other c/o.    Objective: Filed Vitals:   01/05/14 1054  BP: 100/70  Pulse: 78  Temp: 98.6 F (37 C)  Resp: 14    General appearance: alert, no distress, WD/WN Neck: supple, no lymphadenopathy, no thyromegaly, no masses Heart: RRR, normal S1, S2, no murmurs Lungs: CTA bilaterally, no wheezes, rhonchi, or rales Abdomen: +bs, soft, non tender, non distended, no masses, no hepatomegaly, no splenomegaly Pulses: 2+ symmetric, upper and lower extremities, normal cap refill Ext: no edema    Adult ECG Report  Indication: palpitations  Rate: 62bpm  Rhythm: normal sinus rhythm  QRS Axis: 64 degrees  PR Interval:  QRS Duration: 60ms  QTc:  Conduction Disturbances: none  Other Abnormalities: none  Patient's cardiac risk factors are: none.  EKG comparison: none  Narrative Interpretation: normal EKG   Assessment: Encounter Diagnoses  Name Primary?  . Generalized anxiety disorder Yes  . Palpitations   . GERD (gastroesophageal reflux disease)    Plan: Anxiety - c/t Zoloft, Xanax prn, counseled on her concerns, referral  to counseling palpitations - reviewed EKG, advised if palpiations c/t may need holter, but EKG today is normal.   I suspect her symptoms are related to anxiety. GERD - limit GERD triggers, change to prescription 40mg  Omeprazole F/u pending referral, 2wk call back on GERD

## 2014-01-05 NOTE — Telephone Encounter (Signed)
I called and spoke to counselor, referred to Heather Maynard at [facility] for Cognitive and Behavioral Therapy

## 2014-01-12 ENCOUNTER — Encounter: Payer: Self-pay | Admitting: Medical

## 2014-05-04 ENCOUNTER — Other Ambulatory Visit: Payer: Self-pay | Admitting: Medical

## 2014-05-04 NOTE — Telephone Encounter (Signed)
IS THIS OKAY 

## 2014-05-04 NOTE — Telephone Encounter (Signed)
Refill for one month and have her set up an appointment with Vincenza Hews

## 2014-06-04 ENCOUNTER — Other Ambulatory Visit: Payer: Self-pay | Admitting: Medical

## 2014-06-05 NOTE — Telephone Encounter (Signed)
Is this okay to refill? 

## 2014-06-06 ENCOUNTER — Other Ambulatory Visit: Payer: Self-pay | Admitting: Medical

## 2014-06-06 ENCOUNTER — Telehealth: Payer: Self-pay | Admitting: Medical

## 2014-06-06 MED ORDER — SERTRALINE HCL 100 MG PO TABS: 100.0000 mg | ORAL_TABLET | Freq: Every day | ORAL | Status: DC

## 2014-06-08 NOTE — Telephone Encounter (Signed)
Done

## 2014-06-11 ENCOUNTER — Encounter: Payer: Self-pay | Admitting: Medical

## 2014-06-14 ENCOUNTER — Encounter: Payer: Self-pay | Admitting: Medical

## 2014-06-14 ENCOUNTER — Ambulatory Visit (INDEPENDENT_AMBULATORY_CARE_PROVIDER_SITE_OTHER): Payer: 59 | Admitting: Medical

## 2014-06-14 VITALS — BP 102/64 | HR 72 | Temp 98.0°F | Resp 16 | Wt 168.0 lb

## 2014-06-14 DIAGNOSIS — R002 Palpitations: Secondary | ICD-10-CM

## 2014-06-14 DIAGNOSIS — Z8249 Family history of ischemic heart disease and other diseases of the circulatory system: Secondary | ICD-10-CM

## 2014-06-14 DIAGNOSIS — F411 Generalized anxiety disorder: Secondary | ICD-10-CM

## 2014-06-14 MED ORDER — SERTRALINE HCL 100 MG PO TABS: 100.0000 mg | ORAL_TABLET | Freq: Every day | ORAL | Status: DC

## 2014-06-14 MED ORDER — ALPRAZOLAM 0.5 MG PO TABS: 0.5000 mg | ORAL_TABLET | Freq: Every evening | ORAL | Status: DC | PRN

## 2014-06-14 NOTE — Progress Notes (Signed)
Subjective: Here for med check on anxiety.   Since last visit in May, doing good overall.  Had considering coming off Zoloft, but does good on it.   Uses xanax periodically.   Had been doing fine until dad recently had heart attack.  Still gets some pain and swelling in the leg she had fracture earlier this year .  Has been released from ortho, advised that she would have pain and swelling for up to 33mo.   Uses tennis shoes sometimes at work.  No other aggravating or relieving factors.    Past Medical History  Diagnosis Date  . Anxiety     panic attacks  . Acid indigestion     OTC as needed  . Bimalleolar ankle fracture 08/24/2013    right  . Wears contact lenses   . Headache     Objective: Filed Vitals:   06/14/14 0908  BP: 102/64  Pulse: 72  Temp: 98 F (36.7 C)  Resp: 16    General appearance: alert, no distress, WD/WN Neck: supple, no lymphadenopathy, no thyromegaly, no masses Heart: RRR, normal S1, S2, no murmurs Lungs: CTA bilaterally, no wheezes, rhonchi, or rales Pulses: 2+ symmetric, upper and lower extremities, normal cap refill Ext: no edema    Assessment: Encounter Diagnoses  Name Primary?  . Generalized anxiety disorder Yes  . Palpitations   . Family history of heart disease     Plan: Doing overall well with current regimen.  C/t Zoloft, Xanax prn.  Reconsider weaning down on medication in 3-6 mo once she is ready to do this.   Palpitations - rarely, seems to be anxiety related.   We discussed symptoms that would prompt recheck

## 2014-06-21 ENCOUNTER — Other Ambulatory Visit: Payer: Self-pay | Admitting: Medical

## 2014-08-21 ENCOUNTER — Ambulatory Visit (INDEPENDENT_AMBULATORY_CARE_PROVIDER_SITE_OTHER): Payer: 59 | Admitting: Medical

## 2014-08-21 ENCOUNTER — Encounter: Payer: Self-pay | Admitting: Medical

## 2014-08-21 ENCOUNTER — Institutional Professional Consult (permissible substitution): Payer: 59 | Admitting: Medical

## 2014-08-21 VITALS — BP 118/80 | HR 80 | Temp 98.9°F | Resp 16 | Wt 177.2 lb

## 2014-08-21 DIAGNOSIS — R400 Somnolence: Secondary | ICD-10-CM

## 2014-08-21 DIAGNOSIS — G479 Sleep disorder, unspecified: Secondary | ICD-10-CM

## 2014-08-21 DIAGNOSIS — R5383 Other fatigue: Secondary | ICD-10-CM

## 2014-08-21 DIAGNOSIS — F410 Panic disorder [episodic paroxysmal anxiety] without agoraphobia: Secondary | ICD-10-CM

## 2014-08-21 DIAGNOSIS — G4710 Hypersomnia, unspecified: Secondary | ICD-10-CM

## 2014-08-21 DIAGNOSIS — F411 Generalized anxiety disorder: Secondary | ICD-10-CM

## 2014-08-21 DIAGNOSIS — R0683 Snoring: Secondary | ICD-10-CM

## 2014-08-21 MED ORDER — SERTRALINE HCL 50 MG PO TABS: 50.0000 mg | ORAL_TABLET | Freq: Every day | ORAL | Status: DC

## 2014-08-21 MED ORDER — SERTRALINE HCL 100 MG PO TABS: 100.0000 mg | ORAL_TABLET | Freq: Every day | ORAL | Status: DC

## 2014-08-21 NOTE — Progress Notes (Signed)
Subjective: Here for recheck on anxiety.  Few weeks after last visit was starting to have upswing in the frequency of panic attacks.  Prior to this Zoloft was working well, was using xanax prn.  Work is stress free for the most part.  Would get panic attacks out of no where at work for no reason.   Very sporadic.   Lasts for 20+ minutes.  At night if this happens will use 1/2 xanax, but at work.   At work with panic attacks, will talk with sister who will help her take her mind off things.  Has appt in 2 weeks to see counselor, Thelma Comprudy Tobais.  Hasn't seen counselor up til now.  Been fatigued of late, night sweats the last 2 weeks, not sleeping all that well.   No changes at home.  Thought some of this was due to stress of Christmas season.  Lives at home with husband and 3 daughters.   Exercise sometimes with walking.   Goes to bed around 10pm, but lately going to bed 8pm.  Gets up at 6am.  Wakes up multiple times in the night.  No recent fevers.  Snores at times.  No witnessed apnea, no gasping for air.  Doesn't feel rested during the day.   zoloft certainly helps mood, but lately just seems likes panic attacks are frequent.  Using xanax 3-4 times per week.  No other aggravating or relieving factors. No other complaint.  ROS as in subjective  Objective: BP 118/80 mmHg  Pulse 80  Temp(Src) 98.9 F (37.2 C) (Oral)  Resp 16  Wt 177 lb 3.2 oz (80.377 kg)   Wt Readings from Last 3 Encounters:  08/21/14 177 lb 3.2 oz (80.377 kg)  06/14/14 168 lb (76.204 kg)  01/05/14 178 lb (80.74 kg)   General appearance: alert, no distress, WD/WN HEENT: normocephalic, sclerae anicteric, TMs pearly, nares patent, no discharge or erythema, pharynx normal Oral cavity: MMM, no lesions Neck: supple, no lymphadenopathy, no thyromegaly, no masses Heart: RRR, normal S1, S2, no murmurs Lungs: CTA bilaterally, no wheezes, rhonchi, or rales Pulses: 2+ symmetric, upper and lower extremities, normal cap refill Psych:  pleasant, good eye contact, answered questions appropriately Ext: no edema   Assessment: Encounter Diagnoses  Name Primary?  . Generalized anxiety disorder Yes  . Panic attack   . Sleep disturbance   . Snoring   . Daytime somnolence   . Other fatigue      Plan: PHQ-9 score of 6. Epworth sleep scale score of 12.  Discussed her concerns, medications.  Increase to Zoloft 150mg .   C/t xanax prn.   Discussed ways to deal with panic episodes.  C/t with plan to see counseling in 2 wk and talk with her pastor about her concerns, fears.   counseled in general on her concerns.  Consider sleep study.  She will let me know about this.   Possible OSA based on concerns.   F/u soon for physical.

## 2014-09-24 ENCOUNTER — Ambulatory Visit: Payer: 59 | Admitting: Medical

## 2014-09-26 ENCOUNTER — Encounter: Payer: Self-pay | Admitting: Medical

## 2014-09-26 ENCOUNTER — Ambulatory Visit (INDEPENDENT_AMBULATORY_CARE_PROVIDER_SITE_OTHER): Payer: 59 | Admitting: Medical

## 2014-09-26 VITALS — BP 110/70 | HR 68 | Temp 98.0°F | Resp 15 | Ht 64.0 in | Wt 177.0 lb

## 2014-09-26 DIAGNOSIS — G479 Sleep disorder, unspecified: Secondary | ICD-10-CM

## 2014-09-26 DIAGNOSIS — K219 Gastro-esophageal reflux disease without esophagitis: Secondary | ICD-10-CM

## 2014-09-26 DIAGNOSIS — F411 Generalized anxiety disorder: Secondary | ICD-10-CM

## 2014-09-26 DIAGNOSIS — R002 Palpitations: Secondary | ICD-10-CM

## 2014-09-26 DIAGNOSIS — F410 Panic disorder [episodic paroxysmal anxiety] without agoraphobia: Secondary | ICD-10-CM

## 2014-09-26 DIAGNOSIS — Z0000 Encounter for general adult medical examination without abnormal findings: Secondary | ICD-10-CM

## 2014-09-26 DIAGNOSIS — R5383 Other fatigue: Secondary | ICD-10-CM

## 2014-09-26 LAB — POCT URINALYSIS DIPSTICK
BILIRUBIN UA: NEGATIVE
Glucose, UA: NEGATIVE
Ketones, UA: NEGATIVE
Leukocytes, UA: NEGATIVE
Nitrite, UA: NEGATIVE
Protein, UA: NEGATIVE
RBC UA: NEGATIVE
Urobilinogen, UA: NEGATIVE
pH, UA: 6

## 2014-09-26 LAB — CBC
HEMATOCRIT: 37.6 % (ref 36.0–46.0)
HEMOGLOBIN: 12.3 g/dL (ref 12.0–15.0)
MCH: 27 pg (ref 26.0–34.0)
MCHC: 32.7 g/dL (ref 30.0–36.0)
MCV: 82.6 fL (ref 78.0–100.0)
MPV: 8.5 fL — ABNORMAL LOW (ref 8.6–12.4)
Platelets: 381 10*3/uL (ref 150–400)
RBC: 4.55 MIL/uL (ref 3.87–5.11)
RDW: 14.5 % (ref 11.5–15.5)
WBC: 7.8 10*3/uL (ref 4.0–10.5)

## 2014-09-26 LAB — BASIC METABOLIC PANEL
BUN: 12 mg/dL (ref 6–23)
CALCIUM: 9.6 mg/dL (ref 8.4–10.5)
CO2: 27 meq/L (ref 19–32)
CREATININE: 0.48 mg/dL — AB (ref 0.50–1.10)
Chloride: 102 mEq/L (ref 96–112)
Glucose, Bld: 83 mg/dL (ref 70–99)
Potassium: 4.2 mEq/L (ref 3.5–5.3)
Sodium: 138 mEq/L (ref 135–145)

## 2014-09-26 LAB — TSH: TSH: 4.119 u[IU]/mL (ref 0.350–4.500)

## 2014-09-26 NOTE — Addendum Note (Signed)
Addended by: Lilli LightLOMAX, WENDY G on: 09/26/2014 03:27 PM   Modules accepted: Orders

## 2014-09-26 NOTE — Progress Notes (Signed)
Subjective:   HPI  Heather Maynard is a 34 y.o. female who presents for a complete physical.   Preventative care:2016 Last ophthalmology visit:2014.Marland KitchenMarland KitchenFriendly  Last dental visit: 2015 Last colonoscopy:no Last mammogram:never Last gynecological exam:2016, normal pap last month Last EKG done here last Last labs:2015  Prior vaccinations: TD or Tdap:current, done at OBGYN Influenza:2015 Pneumococcal:no  Advanced directive:no Health care power of attorney:no Living will:no  Concerns: Since last visit the Zoloft 150 mg seemed to be working pretty good. She is continuing with counseling and finding this to be helpful.  Sleep is still an issue. Been fatigued of late, night sweats the last 2 weeks, not sleeping all that well. No changes at home. Lives at home with husband and 3 daughters. Exercise sometimes with walking. Goes to bed around 10pm, but lately going to bed 8pm. Gets up at 6am. Wakes up multiple times in the night. No recent fevers. Snores at times. No witnessed apnea, no gasping for air. Doesn't feel rested during the day.hx/o tubal ligation, periods are regular.  Not on OCPs.  No prior sleep aid.   Reviewed their medical, surgical, family, social, medication, and allergy history and updated chart as appropriate.  Past Medical History  Diagnosis Date  . Anxiety     panic attacks  . Acid indigestion     OTC as needed  . Bimalleolar ankle fracture 08/24/2013    right  . Wears contact lenses   . Headache     Past Surgical History  Procedure Laterality Date  . Laparoscopic tubal ligation Bilateral 08/01/2013    Procedure: LAPAROSCOPIC TUBAL LIGATION WITH FILSHIE CLIPS;  Surgeon: Meriel Pica, MD;  Location: [facility];  Service: Gynecology;  Laterality: Bilateral;  . Orif ankle fracture Right 08/29/2013    Procedure: OPEN REDUCTION INTERNAL FIXATION (ORIFRIGHT BIMALLEOLAR  ANKLE FRACTURE;  Surgeon: Sheral Apley, MD;  Location: [facility];  Service: Orthopedics;  Laterality: Right;    History   Social History  . Marital Status: Married    Spouse Name: N/A  . Number of Children: N/A  . Years of Education: N/A   Occupational History  . Not on file.   Social History Main Topics  . Smoking status: Never Smoker   . Smokeless tobacco: Never Used  . Alcohol Use: No  . Drug Use: No  . Sexual Activity: Yes   Other Topics Concern  . 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 Hypertension Father   . Diabetes Father     insulin  . Heart disease Father     MI  . Heart disease Maternal Grandmother   . Hypertension Maternal Grandmother   . Heart disease Paternal Grandmother   . Hypertension Paternal Grandmother   . Diabetes Paternal Grandfather     insulin  . Anxiety disorder Sister   . Cancer Paternal Aunt     colon     Current outpatient prescriptions:  .  ALPRAZolam (XANAX) 0.5 MG tablet, Take 1 tablet (0.5 mg total) by mouth at bedtime as needed for anxiety., Disp: 30 tablet, Rfl: 1 .  omeprazole (PRILOSEC) 40 MG capsule, TAKE 1 CAPSULE BY MOUTH ONCE DAILY, Disp: 30 capsule, Rfl: 2 .  sertraline (ZOLOFT) 100 MG tablet, Take 1 tablet (100 mg total)  by mouth daily., Disp: 30 tablet, Rfl: 1 .  sertraline (ZOLOFT) 50 MG tablet, Take 1 tablet (50 mg total) by mouth at bedtime., Disp: 30 tablet, Rfl: 1  Allergies  Allergen Reactions  . Soap Itching and Other (See Comments)    SKIN IRRITATION   Review of Systems Constitutional: -fever, -chills, +sweats, -unexpected weight change, -decreased appetite, +fatigue Allergy: -sneezing, -itching, -congestion  Dermatology: -changing moles, --rash, -lumps ENT: -runny nose, -ear pain, -sore throat, -hoarseness, -sinus pain, -teeth pain, - ringing in ears, -hearing loss,  -nosebleeds Cardiology: -chest pain, +palpitations, -swelling, -difficulty breathing when lying flat, -waking up short of breath Respiratory: -cough, -shortness of breath, -difficulty breathing with exercise or exertion, -wheezing, -coughing up blood Gastroenterology: -abdominal pain, -nausea, -vomiting, -diarrhea, -constipation, -blood in stool, -changes in bowel movement, -difficulty swallowing or eating Hematology: -bleeding, -bruising  Musculoskeletal: -joint aches, -muscle aches, -joint swelling,  +back pain, -neck pain, -cramping, -changes in gait Ophthalmology: denies vision changes, eye redness, itching, discharge Urology: -burning with urination, -difficulty urinating, -blood in urine, -urinary frequency, -urgency, -incontinence Neurology: -headache, -weakness, -tingling, +numbness, -memory loss, -falls, -dizziness Psychology: -depressed mood, -agitation, +sleep problems     Objective:   Physical Exam  BP 110/70 mmHg  Pulse 68  Temp(Src) 98 F (36.7 C) (Oral)  Resp 15  Ht 5\' 4"  (1.626 m)  Wt 177 lb (80.287 kg)  BMI 30.37 kg/m2  LMP 09/20/2014  Wt Readings from Last 3 Encounters:  09/26/14 177 lb (80.287 kg)  08/21/14 177 lb 3.2 oz (80.377 kg)  06/14/14 168 lb (76.204 kg)   General appearance: alert, no distress, WD/WN, white female Skin: right volar wrist tatoo, few scattered macules, no worrisome lesions HEENT: normocephalic, conjunctiva/corneas normal, sclerae anicteric, PERRLA, EOMi, nares patent, no discharge or erythema, pharynx normal Oral cavity: MMM, tongue normal, teeth normal Neck: supple, no lymphadenopathy, no thyromegaly, no masses, normal ROM, no bruits Chest: non tender, normal shape and expansion Heart: RRR, normal S1, S2, no murmurs Lungs: CTA bilaterally, no wheezes, rhonchi, or rales Abdomen: +bs, striae, soft, non tender, non distended, no masses, no hepatomegaly, no splenomegaly, no bruits Back: non tender, normal ROM, no  scoliosis Musculoskeletal: upper extremities non tender, no obvious deformity, normal ROM throughout, lower extremities non tender, no obvious deformity, normal ROM throughout Extremities: no edema, no cyanosis, no clubbing Pulses: 2+ symmetric, upper and lower extremities, normal cap refill Neurological: alert, oriented x 3, CN2-12 intact, strength normal upper extremities and lower extremities, sensation normal throughout, DTRs 2+ throughout, no cerebellar signs, gait normal Psychiatric: normal affect, behavior normal, pleasant  Breast/gyn/rectal -deferred to gyn    Assessment and Plan :    Encounter Diagnoses  Name Primary?  . Encounter for health maintenance examination in adult Yes  . Gastroesophageal reflux disease without esophagitis   . Generalized anxiety disorder   . Panic attack   . Sleep disturbance   . Other fatigue   . Palpitations     Physical exam - discussed healthy lifestyle, diet, exercise, preventative care, vaccinations, and addressed their concerns.  Handout given. GERD-continue current medicine and trigger avoidance Anxiety, panic attack, continue counseling, continue Zoloft 150 mg daily, Xanax when necessary Sleep disturbance, fatigue - discussed sleep hygiene, we'll begin a trial of either Benadryl or melatonin over-the-counter daily at bedtime, follow-up if not improving, follow-up pending labs palpitaions - rare, brief, declines EKG today, reviewed prior labs and EKG Sweats - if labs nromal, will get baseline PPD test. See your dentist yearly for routine dental care  including hygiene visits twice yearly. See your gynecologist yearly for routine gynecological care. Follow-up pending labs

## 2014-09-26 NOTE — Addendum Note (Signed)
Addended by: Lilli LightLOMAX, Tasnia Spegal G on: 09/26/2014 03:50 PM   Modules accepted: Orders

## 2014-09-27 NOTE — Progress Notes (Signed)
LM to [facility] 

## 2014-10-03 ENCOUNTER — Other Ambulatory Visit: Payer: Self-pay | Admitting: Medical

## 2014-11-02 ENCOUNTER — Other Ambulatory Visit: Payer: Self-pay | Admitting: Medical

## 2015-01-11 ENCOUNTER — Other Ambulatory Visit: Payer: Self-pay | Admitting: Medical

## 2015-02-15 ENCOUNTER — Other Ambulatory Visit: Payer: Self-pay | Admitting: Medical

## 2015-04-04 ENCOUNTER — Telehealth: Payer: Self-pay

## 2015-04-04 NOTE — Telephone Encounter (Signed)
Received refill request for Sertraline HCL  #30

## 2015-04-05 NOTE — Telephone Encounter (Signed)
Patient said she is taking 150 mg and doing great she just needs RX changed to either 100 mg take 1 and 1/2 tab daily or 50 mg 3 pills at once because she is having to pay to copays on the med now

## 2015-04-05 NOTE — Telephone Encounter (Signed)
Verify she is still taking  daily? Correct?  Is she doing ok currently?

## 2015-04-08 ENCOUNTER — Other Ambulatory Visit: Payer: Self-pay | Admitting: Medical

## 2015-04-08 MED ORDER — SERTRALINE HCL 50 MG PO TABS: ORAL_TABLET | ORAL | Status: DC

## 2015-04-19 ENCOUNTER — Other Ambulatory Visit: Payer: Self-pay | Admitting: Medical

## 2015-04-19 ENCOUNTER — Telehealth: Payer: Self-pay

## 2015-04-19 NOTE — Telephone Encounter (Signed)
Called in prescription for Xanax 0.5mg  #30

## 2015-04-19 NOTE — Telephone Encounter (Signed)
Is this okay to refill? 

## 2015-04-19 NOTE — Telephone Encounter (Signed)
pls call out Xanax

## 2015-04-19 NOTE — Telephone Encounter (Signed)
Med was called out to pharmacy by [employer]

## 2015-08-13 ENCOUNTER — Other Ambulatory Visit: Payer: Self-pay | Admitting: Medical

## 2015-08-13 MED FILL — OMEPRAZOLE DR 40 MG CAPSULE: 40 | 90 days supply | Qty: 90 | Fill #2

## 2015-08-13 MED FILL — SERTRALINE HCL 50 MG TABLET: 50 | 30 days supply | Qty: 90 | Fill #0

## 2015-08-13 NOTE — Telephone Encounter (Signed)
Is this ok to refill?  

## 2015-08-27 DIAGNOSIS — H5213 Myopia, bilateral: Secondary | ICD-10-CM | POA: Diagnosis not present

## 2015-09-05 MED FILL — ALPRAZolam 0.5 MG TABS: 0.5 | 30 days supply | Qty: 30 | Fill #1

## 2015-09-28 IMAGING — CR DG ANKLE COMPLETE 3+V*R*
3 series · 3 of 3 positions shown · non-contrast
Comparison: None.

CLINICAL DATA: Pain post trauma

EXAM:
RIGHT ANKLE - COMPLETE 3+ VIEW

[view not recorded (1 of 3)]
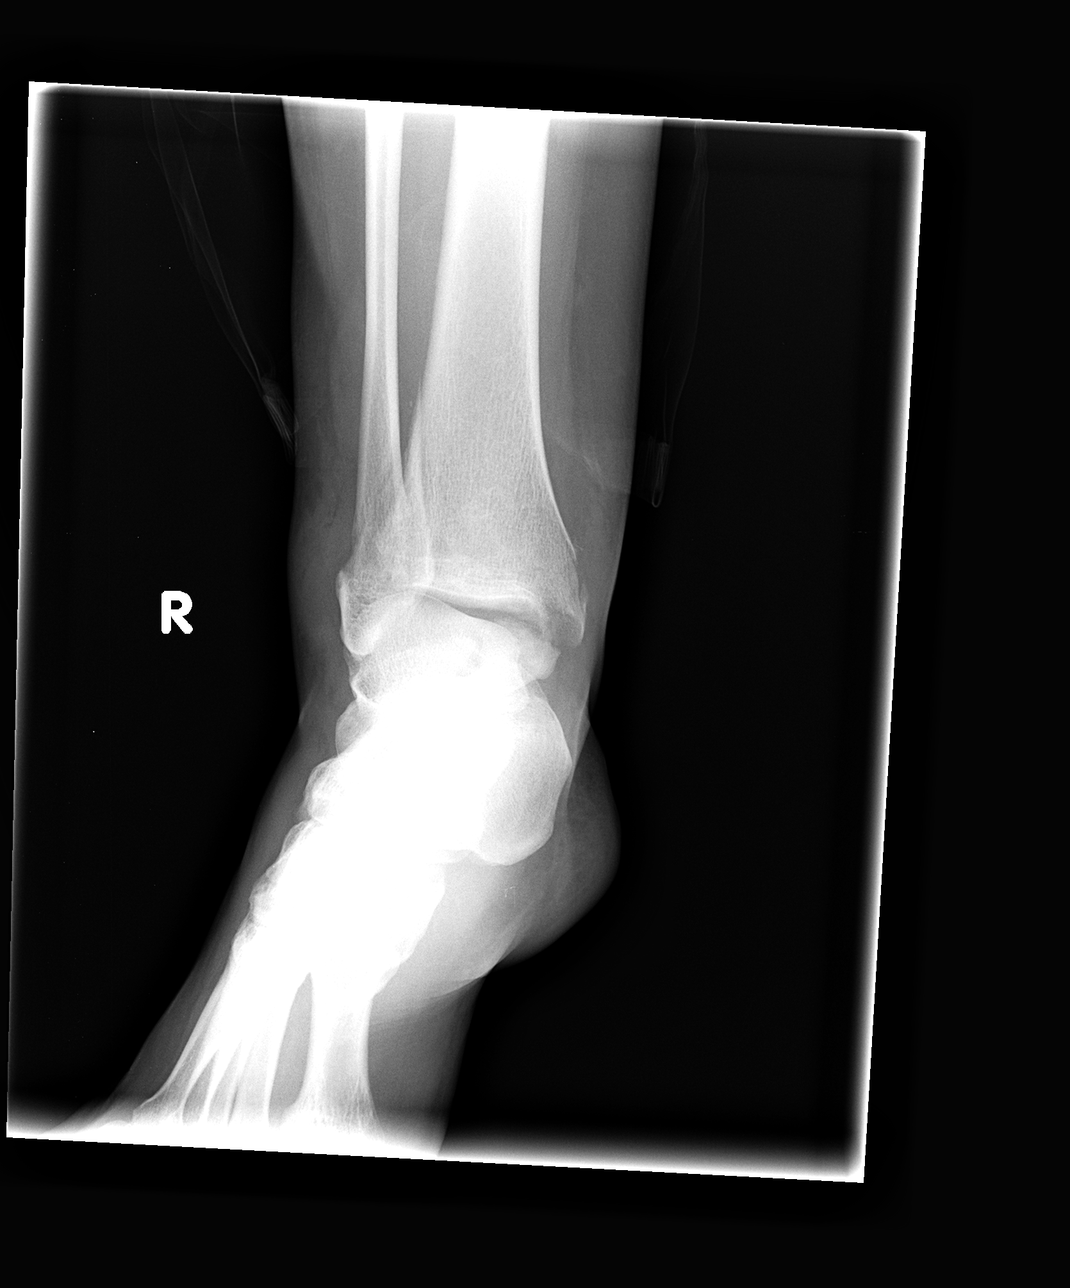

[view not recorded (2 of 3)]
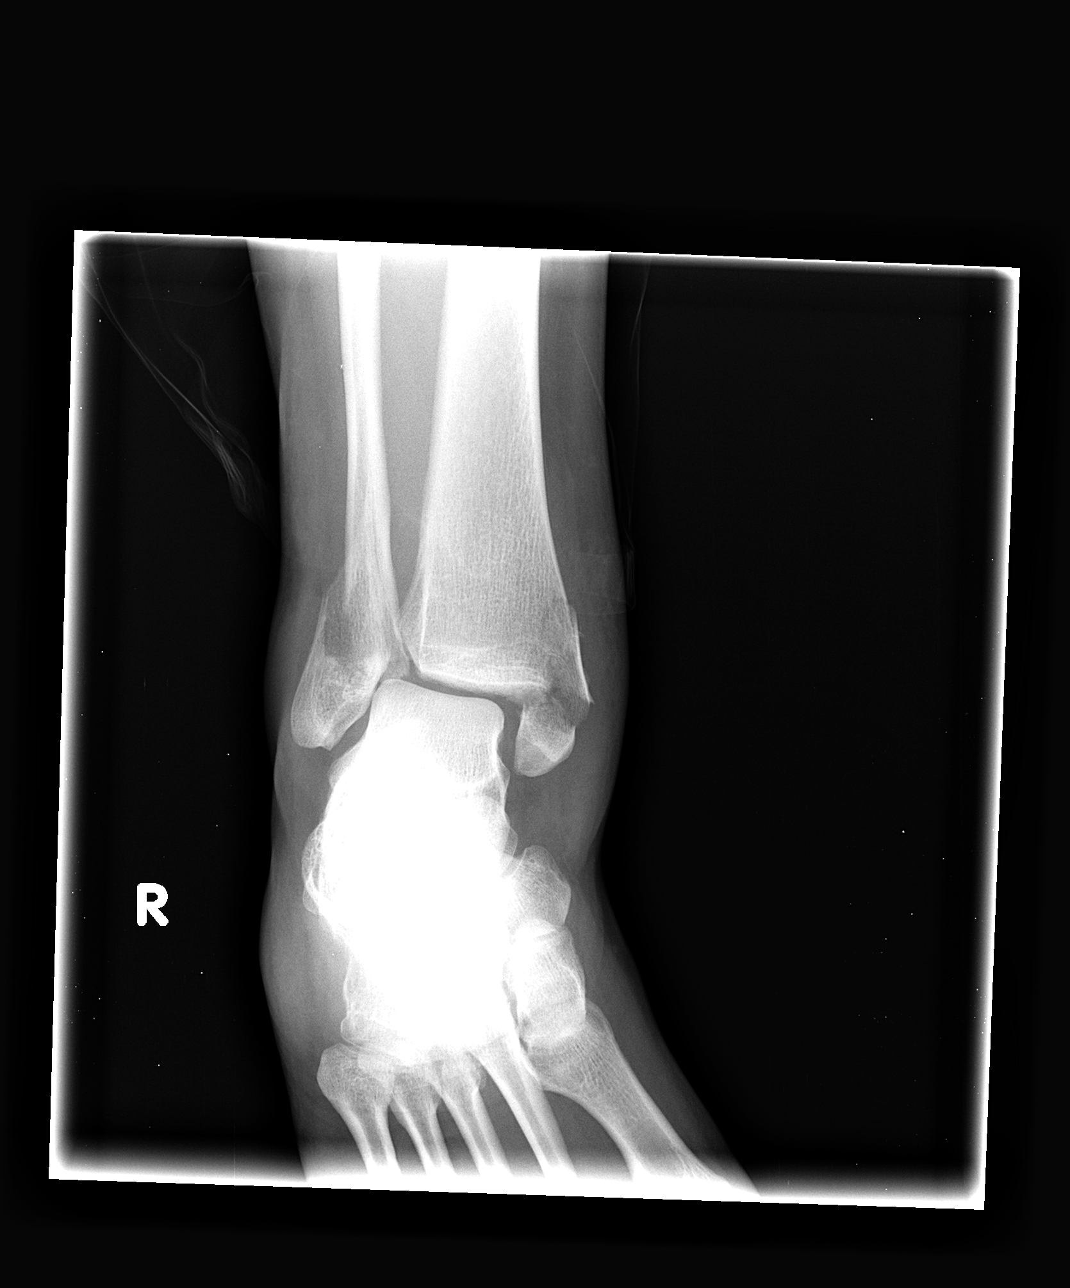

[view not recorded (3 of 3)]
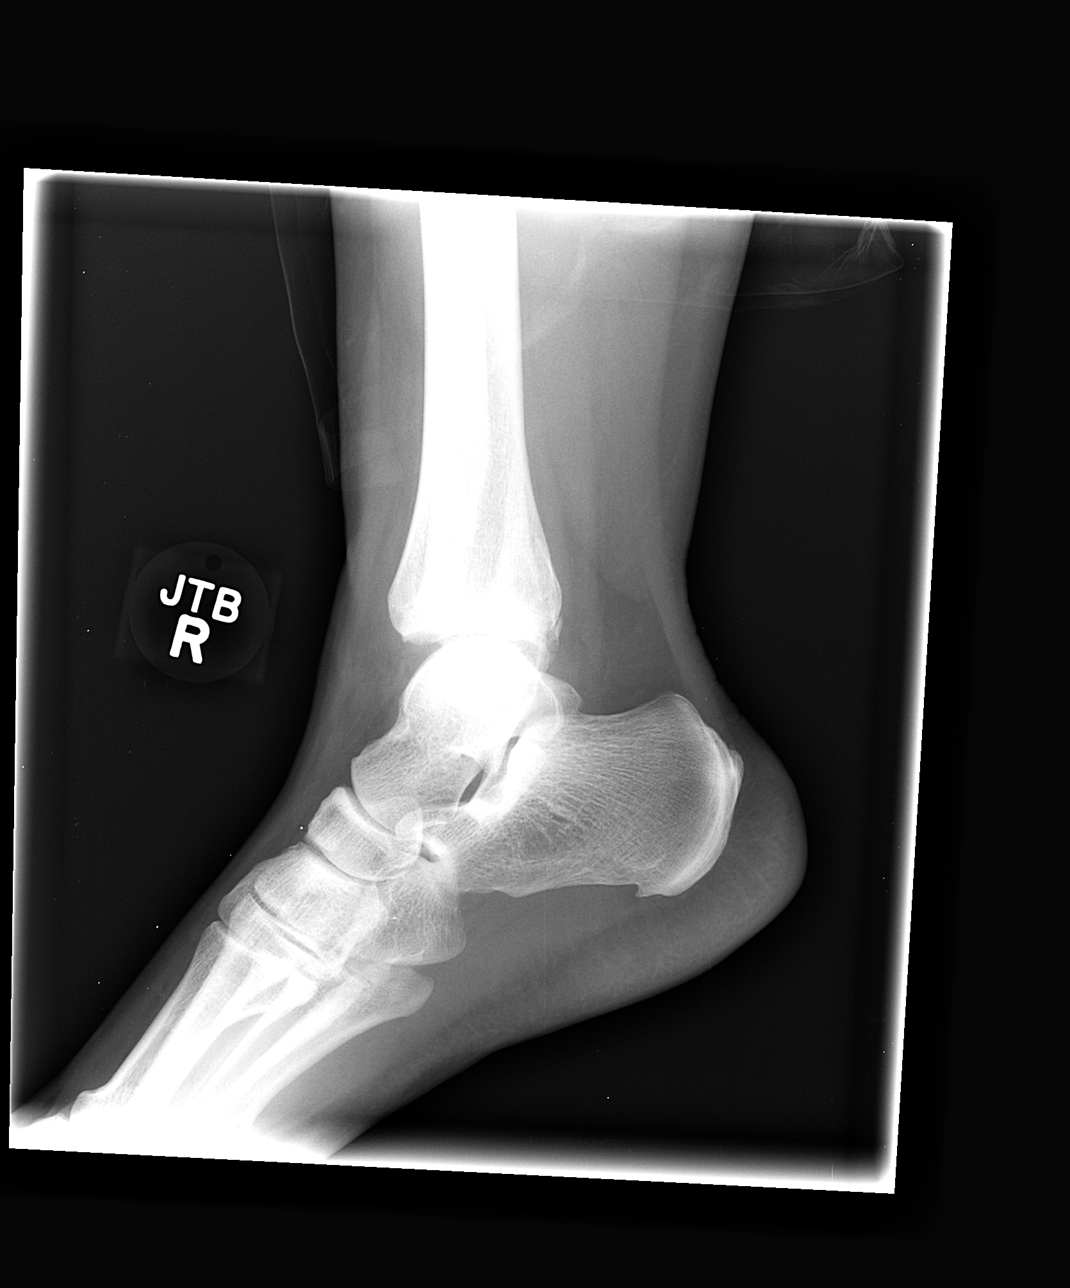

[3 of 3 positions shown; findings below may reference images not displayed]

FINDINGS: Frontal, oblique, and lateral views were obtained. There is a
comminuted fracture of the medial malleolus with the medial
malleolus displaced laterally. There is a fracture of the distal
fibular metaphysis -diaphysis junction with displaced fragments.
There is ankle mortise disruption.
IMPRESSION: Fractures of the distal fibula and medial malleolar regions with
displaced fragments. There is ankle mortise disruption.

## 2015-09-30 MED FILL — SERTRALINE HCL 50 MG TABLET: 50 | 30 days supply | Qty: 90 | Fill #1

## 2015-10-30 MED FILL — SERTRALINE HCL 50 MG TABLET: 50 | 30 days supply | Qty: 90 | Fill #2

## 2015-11-12 ENCOUNTER — Other Ambulatory Visit: Payer: Self-pay | Admitting: Medical

## 2015-11-13 MED FILL — OMEPRAZOLE DR 40 MG CAPSULE: 40 | 90 days supply | Qty: 90 | Fill #0

## 2015-11-14 DIAGNOSIS — Z6832 Body mass index (BMI) 32.0-32.9, adult: Secondary | ICD-10-CM | POA: Diagnosis not present

## 2015-11-14 DIAGNOSIS — Z124 Encounter for screening for malignant neoplasm of cervix: Secondary | ICD-10-CM | POA: Diagnosis not present

## 2015-11-14 DIAGNOSIS — Z13220 Encounter for screening for lipoid disorders: Secondary | ICD-10-CM | POA: Diagnosis not present

## 2015-11-14 DIAGNOSIS — Z131 Encounter for screening for diabetes mellitus: Secondary | ICD-10-CM | POA: Diagnosis not present

## 2015-11-14 DIAGNOSIS — Z01419 Encounter for gynecological examination (general) (routine) without abnormal findings: Secondary | ICD-10-CM | POA: Diagnosis not present

## 2015-11-14 DIAGNOSIS — Z1329 Encounter for screening for other suspected endocrine disorder: Secondary | ICD-10-CM | POA: Diagnosis not present

## 2015-11-14 DIAGNOSIS — Z1321 Encounter for screening for nutritional disorder: Secondary | ICD-10-CM | POA: Diagnosis not present

## 2015-11-15 MED FILL — VIT D2 1.25 MG (50,000 UNIT: 1.25 MG | 70 days supply | Qty: 10 | Fill #0

## 2015-12-12 ENCOUNTER — Other Ambulatory Visit: Payer: Self-pay | Admitting: Medical

## 2015-12-13 ENCOUNTER — Telehealth: Payer: Self-pay | Admitting: Medical

## 2015-12-13 MED ORDER — SERTRALINE HCL 50 MG PO TABS: 150.0000 mg | ORAL_TABLET | Freq: Every day | ORAL | Status: DC

## 2015-12-13 MED FILL — SERTRALINE HCL 50 MG TABLET: 50 | 30 days supply | Qty: 90 | Fill #0

## 2015-12-13 NOTE — Telephone Encounter (Signed)
Pt made a CPE appt for 5/23. Requesting refill on Zoloft 50 mg to last until that CPE appt

## 2015-12-13 NOTE — Telephone Encounter (Signed)
CALLED AND LEFT MESSAGE TO GIVE US A CALL BACK TO SET UP CPE

## 2015-12-13 NOTE — Telephone Encounter (Signed)
Refill sent electronically

## 2015-12-13 NOTE — Telephone Encounter (Signed)
Get her in for physical 

## 2015-12-13 NOTE — Telephone Encounter (Signed)
pls give the refill

## 2015-12-13 NOTE — Telephone Encounter (Signed)
Is this ok to refill?  

## 2015-12-31 ENCOUNTER — Encounter: Payer: Self-pay | Admitting: Medical

## 2015-12-31 ENCOUNTER — Ambulatory Visit (INDEPENDENT_AMBULATORY_CARE_PROVIDER_SITE_OTHER): Payer: 59 | Admitting: Medical

## 2015-12-31 VITALS — BP 122/84 | HR 96 | Ht 64.25 in | Wt 193.0 lb

## 2015-12-31 DIAGNOSIS — Z8781 Personal history of (healed) traumatic fracture: Secondary | ICD-10-CM | POA: Diagnosis not present

## 2015-12-31 DIAGNOSIS — E669 Obesity, unspecified: Secondary | ICD-10-CM | POA: Diagnosis not present

## 2015-12-31 DIAGNOSIS — K219 Gastro-esophageal reflux disease without esophagitis: Secondary | ICD-10-CM | POA: Insufficient documentation

## 2015-12-31 DIAGNOSIS — Z0000 Encounter for general adult medical examination without abnormal findings: Secondary | ICD-10-CM

## 2015-12-31 DIAGNOSIS — E559 Vitamin D deficiency, unspecified: Secondary | ICD-10-CM

## 2015-12-31 DIAGNOSIS — Z131 Encounter for screening for diabetes mellitus: Secondary | ICD-10-CM | POA: Insufficient documentation

## 2015-12-31 DIAGNOSIS — F411 Generalized anxiety disorder: Secondary | ICD-10-CM | POA: Insufficient documentation

## 2015-12-31 MED ORDER — SERTRALINE HCL 50 MG PO TABS: 150.0000 mg | ORAL_TABLET | Freq: Every day | ORAL | Status: DC

## 2015-12-31 MED ORDER — RANITIDINE HCL 150 MG PO TABS: 150.0000 mg | ORAL_TABLET | Freq: Two times a day (BID) | ORAL | Status: DC

## 2015-12-31 MED ORDER — OMEPRAZOLE 40 MG PO CPDR: 40.0000 mg | DELAYED_RELEASE_CAPSULE | Freq: Every day | ORAL | Status: DC

## 2015-12-31 MED ORDER — ALPRAZOLAM 0.5 MG PO TABS: ORAL_TABLET | ORAL | Status: DC

## 2015-12-31 NOTE — Progress Notes (Signed)
Subjective:   HPI  Heather Maynard is a 35 y.o. female who presents for a complete physical.   Concerns: Doing fine in general, doing fine on current medication.   She had recent labs at gyn appt, Vit D was low, started on ergocalciferol, cholesterol was borderline.   Reviewed their medical, surgical, family, social, medication, and allergy history and updated chart as appropriate.  Past Medical History  Diagnosis Date  . Anxiety     panic attacks  . Acid indigestion     OTC as needed  . Bimalleolar ankle fracture 08/24/2013    right  . Wears contact lenses   . Headache     Past Surgical History  Procedure Laterality Date  . Laparoscopic tubal ligation Bilateral 08/01/2013    Procedure: LAPAROSCOPIC TUBAL LIGATION WITH FILSHIE CLIPS;  Surgeon: Meriel Pica, MD;  Location: [facility];  Service: Gynecology;  Laterality: Bilateral;  . Orif ankle fracture Right 08/29/2013    Procedure: OPEN REDUCTION INTERNAL FIXATION (ORIFRIGHT BIMALLEOLAR  ANKLE FRACTURE;  Surgeon: Sheral Apley, MD;  Location: [facility];  Service: Orthopedics;  Laterality: Right;    Social History   Social History  . Marital Status: Married    Spouse Name: N/A  . Number of Children: N/A  . Years of Education: N/A   Occupational History  . Not on file.   Social History Main Topics  . Smoking status: Never Smoker   . Smokeless tobacco: Never Used  . Alcohol Use: No  . Drug Use: No  . Sexual Activity: Yes   Other Topics Concern  . 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 Hypertension Father   . Diabetes Father     insulin  . Heart disease Father     MI  . Heart disease Maternal Grandmother   . Hypertension Maternal Grandmother   . Heart disease Paternal Grandmother   . Hypertension  Paternal Grandmother   . Diabetes Paternal Grandfather     insulin  . Anxiety disorder Sister   . Cancer Paternal Aunt     colon     Current outpatient prescriptions:  .  omeprazole (PRILOSEC) 40 MG capsule, Take 1 capsule (40 mg total) by mouth daily., Disp: 90 capsule, Rfl: 1 .  sertraline (ZOLOFT) 50 MG tablet, Take 3 tablets (150 mg total) by mouth daily., Disp: 270 tablet, Rfl: 3 .  Vitamin D, Ergocalciferol, (DRISDOL) 50000 units CAPS capsule, Take 50,000 Units by mouth every 7 (seven) days., Disp: , Rfl:  .  ALPRAZolam (XANAX) 0.5 MG tablet, TAKE 1 TABLET BY MOUTH AT BEDTIME AS NEEDED FOR ANXIETY, Disp: 30 tablet, Rfl: 3 .  ranitidine (ZANTAC) 150 MG tablet, Take 1 tablet (150 mg total) by mouth 2 (two) times daily., Disp: 60 tablet, Rfl: 2  Allergies  Allergen Reactions  . Soap Itching and Other (See Comments)    SKIN IRRITATION   Review of Systems Constitutional: -fever, -chills, -sweats, -unexpected weight change, -decreased appetite, -fatigue Allergy: -sneezing, -itching, -congestion  Dermatology: -changing moles, --rash, -lumps ENT: -runny nose, -ear pain, -sore throat, -hoarseness, -sinus pain, -teeth pain, - ringing in ears, -hearing loss, -nosebleeds Cardiology: -chest pain, -palpitations, -swelling, -difficulty breathing when lying flat, -waking up short of breath Respiratory: -cough, -shortness of breath, -difficulty  breathing with exercise or exertion, -wheezing, -coughing up blood Gastroenterology: -abdominal pain, -nausea, -vomiting, -diarrhea, -constipation, -blood in stool, -changes in bowel movement, -difficulty swallowing or eating Hematology: -bleeding, -bruising  Musculoskeletal: -joint aches, -muscle aches, -joint swelling,  -back pain, -neck pain, -cramping, -changes in gait Ophthalmology: denies vision changes, eye redness, itching, discharge Urology: -burning with urination, -difficulty urinating, -blood in urine, -urinary frequency, -urgency,  -incontinence Neurology: -headache, -weakness, -tingling, -numbness, -memory loss, -falls, -dizziness Psychology: -depressed mood, -agitation, -sleep problems     Objective:   Physical Exam  BP 122/84 mmHg  Pulse 96  Ht 5' 4.25" (1.632 m)  Wt 193 lb (87.544 kg)  BMI 32.87 kg/m2  LMP 12/03/2015  Wt Readings from Last 3 Encounters:  12/31/15 193 lb (87.544 kg)  09/26/14 177 lb (80.287 kg)  08/21/14 177 lb 3.2 oz (80.377 kg)   General appearance: alert, no distress, WD/WN, white female Skin: right volar wrist tattoo, tattoo posterior neck, few scattered macules, no worrisome lesions HEENT: normocephalic, conjunctiva/corneas normal, sclerae anicteric, PERRLA, EOMi, nares patent, no discharge or erythema, pharynx normal Oral cavity: MMM, tongue normal, teeth normal Neck: supple, no lymphadenopathy, no thyromegaly, no masses, normal ROM, no bruits Chest: non tender, normal shape and expansion Heart: RRR, normal S1, S2, no murmurs Lungs: CTA bilaterally, no wheezes, rhonchi, or rales Abdomen: +bs, striae, soft, non tender, non distended, no masses, no hepatomegaly, no splenomegaly, no bruits Back: non tender, normal ROM, no scoliosis Musculoskeletal: upper extremities non tender, no obvious deformity, normal ROM throughout, lower extremities non tender, no obvious deformity, normal ROM throughout Extremities: no edema, no cyanosis, no clubbing Pulses: 2+ symmetric, upper and lower extremities, normal cap refill Neurological: alert, oriented x 3, CN2-12 intact, strength normal upper extremities and lower extremities, sensation normal throughout, DTRs 2+ throughout, no cerebellar signs, gait normal Psychiatric: normal affect, behavior normal, pleasant  Breast/gyn/rectal -deferred to gyn    Assessment and Plan :    Encounter Diagnoses  Name Primary?  . Encounter for health maintenance examination in adult Yes  . Generalized anxiety disorder   . History of fracture   . Vitamin D  deficiency   . Gastroesophageal reflux disease without esophagitis   . Obesity     Physical exam - discussed healthy lifestyle, diet, exercise, preventative care, vaccinations, and addressed their concerns.   Anxiety, panic attack, continue counseling, continue Zoloft 150 mg daily, Xanax when necessary Vit D deficiency, hx/o fracture, advised regular aerobic and weight bearing exercise, she is currently finishing Vit D prescription and going to OTC Vit D per gynecology, discussed diet, sunlight exposure GERD- try to change to zantac, stop PPI other than prn use given risk for osteoporosis obesity - advised weight loss through healthy diet, exercise See your dentist yearly for routine dental care including hygiene visits twice yearly. See your gynecologist yearly for routine gynecological care. Follow-up pending lab review from recent labs done from gynecology

## 2016-01-15 ENCOUNTER — Telehealth: Payer: Self-pay

## 2016-01-15 NOTE — Telephone Encounter (Signed)
Records and labs rcvd from [facility]. Placed in your folder for review. Trixie Rude/RLB

## 2016-01-17 ENCOUNTER — Encounter: Payer: Self-pay | Admitting: Medical

## 2016-01-21 MED FILL — SERTRALINE HCL 50 MG TABLET: 50 | 90 days supply | Qty: 270 | Fill #0

## 2016-02-10 MED FILL — raNITIdine HCL 150 MG TABS: 150 | 30 days supply | Qty: 60 | Fill #0

## 2016-02-10 MED FILL — OMEPRAZOLE DR 40 MG CAPSULE: 40 | 90 days supply | Qty: 90 | Fill #0

## 2016-02-10 MED FILL — ALPRAZolam 0.5 MG TABS: 0.5 | 30 days supply | Qty: 30 | Fill #0

## 2016-03-16 DIAGNOSIS — N760 Acute vaginitis: Secondary | ICD-10-CM | POA: Diagnosis not present

## 2016-03-16 DIAGNOSIS — R300 Dysuria: Secondary | ICD-10-CM | POA: Diagnosis not present

## 2016-03-17 DIAGNOSIS — R300 Dysuria: Secondary | ICD-10-CM | POA: Diagnosis not present

## 2016-03-17 MED FILL — SULFAMETHOXAZOLE/TMP DS TAB: 800-160 | 5 days supply | Qty: 10 | Fill #0

## 2016-03-17 MED FILL — FLUCONAZOLE 150 MG TABLET: 150 | 1 days supply | Qty: 1 | Fill #0

## 2016-04-17 DIAGNOSIS — L700 Acne vulgaris: Secondary | ICD-10-CM | POA: Diagnosis not present

## 2016-04-17 MED FILL — ADAPALENE 0.3% GEL: 0.3 | 30 days supply | Qty: 45 | Fill #0

## 2016-04-17 MED FILL — CLINDAMYCIN-BENZOYL PEROX 1: 1-5 | 30 days supply | Qty: 25 | Fill #0

## 2016-04-17 MED FILL — DOXYCYCLINE HYCLATE 100 MG: 100 | 30 days supply | Qty: 30 | Fill #0

## 2016-05-08 MED FILL — SERTRALINE HCL 50 MG TABLET: 50 | 90 days supply | Qty: 270 | Fill #1

## 2016-06-17 MED FILL — OMEPRAZOLE DR 40 MG CAPSULE: 40 | 90 days supply | Qty: 90 | Fill #1

## 2016-09-02 ENCOUNTER — Other Ambulatory Visit: Payer: Self-pay | Admitting: Medical

## 2016-09-02 MED FILL — SERTRALINE HCL 50 MG TABLET: 50 | 90 days supply | Qty: 270 | Fill #2

## 2016-09-02 NOTE — Telephone Encounter (Signed)
Can she have a refill on this, also med her an appt for a med check on next Friday .

## 2016-09-03 ENCOUNTER — Telehealth: Payer: Self-pay

## 2016-09-03 MED FILL — ALPRAZolam 0.5 MG TABS: 0.5 | 30 days supply | Qty: 30 | Fill #0

## 2016-09-03 NOTE — Telephone Encounter (Signed)
[phone]

## 2016-09-03 NOTE — Telephone Encounter (Signed)
Call these out

## 2016-09-04 NOTE — Telephone Encounter (Signed)
Called into pharmacy

## 2016-09-11 ENCOUNTER — Ambulatory Visit (INDEPENDENT_AMBULATORY_CARE_PROVIDER_SITE_OTHER): Payer: 59 | Admitting: Medical

## 2016-09-11 ENCOUNTER — Encounter: Payer: Self-pay | Admitting: Medical

## 2016-09-11 VITALS — BP 132/90 | HR 70 | Wt 197.0 lb

## 2016-09-11 DIAGNOSIS — F411 Generalized anxiety disorder: Secondary | ICD-10-CM

## 2016-09-11 DIAGNOSIS — Z8781 Personal history of (healed) traumatic fracture: Secondary | ICD-10-CM | POA: Diagnosis not present

## 2016-09-11 DIAGNOSIS — E559 Vitamin D deficiency, unspecified: Secondary | ICD-10-CM

## 2016-09-11 DIAGNOSIS — E669 Obesity, unspecified: Secondary | ICD-10-CM

## 2016-09-11 DIAGNOSIS — K219 Gastro-esophageal reflux disease without esophagitis: Secondary | ICD-10-CM | POA: Diagnosis not present

## 2016-09-11 MED ORDER — SERTRALINE HCL 50 MG PO TABS: 150.0000 mg | ORAL_TABLET | Freq: Every day | ORAL | 3 refills | Status: DC

## 2016-09-11 MED ORDER — VITAMIN D (ERGOCALCIFEROL) 1.25 MG (50000 UNIT) PO CAPS: 50000.0000 [IU] | ORAL_CAPSULE | ORAL | 3 refills | Status: DC

## 2016-09-11 NOTE — Progress Notes (Signed)
Subjective: Chief Complaint  Patient presents with  . med check    med check    Here for med check.   Doing ok in general.    Has Vit D deficiency.  Takes Vit D most days.   Doesn't eat a lot of fish or seafood.  Mood been ok.  Has her good days and bad.   Major stressors now are money is tight, being a parent. They are paying down a loan from house remodel they just completed.  otherwise mood is ok.  Still taking medications unchanged, Zoloft 150mg  daily, xanax occasionally.  Takes Prilosec 40mg   From last visit didn't tolerate zantac.  Went back to 40mg  Prilosec.  No other aggravating or relieving factors. No other complaint.   Past Medical History:  Diagnosis Date  . Acid indigestion    OTC as needed  . Anxiety    panic attacks  . Bimalleolar ankle fracture 08/24/2013   right  . Headache   . Wears contact lenses    Current Outpatient Prescriptions on File Prior to Visit  Medication Sig Dispense Refill  . ALPRAZolam (XANAX) 0.5 MG tablet TAKE 1 TABLET BY MOUTH AT BEDTIME AS NEEDED FOR ANXIETY 30 tablet 0  . omeprazole (PRILOSEC) 40 MG capsule TAKE 1 CAPSULE BY MOUTH ONCE DAILY 90 capsule 1   No current facility-administered medications on file prior to visit.    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   ROS as in subjective   Objective: BP 132/90   Pulse 70   Wt 197 lb (89.4 kg)   SpO2 98%   BMI 33.55 kg/m   BP Readings from Last 3 Encounters:  09/11/16 132/90  12/31/15 122/84  09/26/14 110/70   Wt Readings from Last 3 Encounters:  09/11/16 197 lb (89.4 kg)  12/31/15 193 lb (87.5 kg)  09/26/14 177 lb (80.3 kg)   General appearance: alert, no distress, WD/WN,  Neck: supple, no lymphadenopathy, no thyromegaly, no masses Heart: RRR, normal S1, S2, no murmurs Lungs: CTA bilaterally, no wheezes, rhonchi, or  rales Abdomen: +bs, soft, non tender, non distended, no masses, no hepatomegaly, no splenomegaly Pulses: 2+ symmetric, upper and lower extremities, normal cap refill Ext: no edema   Assessment: Encounter Diagnoses  Name Primary?  . Generalized anxiety disorder Yes  . Vitamin D deficiency   . Gastroesophageal reflux disease without esophagitis   . History of fracture   . Obesity with serious comorbidity, unspecified classification, unspecified obesity type      Plan: Anxiety, mood - c/t Zoloft 150mg  daily, xanax prn, stress reduction where possible  Restart weekly prescription Vit D  GERD - discussed risks of PPI, she can't tolerate H2 blocker, not effective enough.  She will try Omeprazole 20mg  daily.    Discussed risks of osteoporosis and reducing risks of this.  Advised she c/t vit d, healthy diet, limit alcohol, use weight bearing exercise at least 2 times weekly  Heather Maynard was seen today for med check.  Diagnoses and all orders for this visit:  Generalized anxiety disorder  Vitamin D deficiency  Gastroesophageal reflux disease without esophagitis  History of fracture  Obesity with serious comorbidity, unspecified classification, unspecified obesity type  Other orders -     sertraline (ZOLOFT) 50 MG tablet; Take 3 tablets (150 mg total) by mouth daily. -     Vitamin  D, Ergocalciferol, (DRISDOL) 50000 units CAPS capsule; Take 1 capsule (50,000 Units total) by mouth every 7 (seven) days.

## 2016-09-30 ENCOUNTER — Other Ambulatory Visit: Payer: Self-pay | Admitting: Medical

## 2016-09-30 ENCOUNTER — Telehealth: Payer: Self-pay | Admitting: Medical

## 2016-09-30 MED ORDER — OMEPRAZOLE 40 MG PO CPDR: 40.0000 mg | DELAYED_RELEASE_CAPSULE | Freq: Every day | ORAL | 1 refills | Status: DC

## 2016-09-30 MED FILL — OMEPRAZOLE DR 40 MG CAPSULE: 40 | 90 days supply | Qty: 90 | Fill #0

## 2016-09-30 MED FILL — VIT D2 1.25 MG (50,000 UNIT: 1.25 MG | 84 days supply | Qty: 12 | Fill #0

## 2016-09-30 NOTE — Telephone Encounter (Signed)
Omeprazole 40mg  resent to pharmacy

## 2016-09-30 NOTE — Telephone Encounter (Signed)
Pt called and states that the 20 mg prilosec is not working and wants to go back to the 40 mg, state she is still having the acid reflex and having the nausea, pt can be reached at [phone] and pt uses [facility] [place] [address]

## 2016-10-01 NOTE — Telephone Encounter (Signed)
Informed pt that rx was sent to the pharmacy °

## 2016-12-01 DIAGNOSIS — Z6833 Body mass index (BMI) 33.0-33.9, adult: Secondary | ICD-10-CM | POA: Diagnosis not present

## 2016-12-01 DIAGNOSIS — H5213 Myopia, bilateral: Secondary | ICD-10-CM | POA: Diagnosis not present

## 2016-12-01 DIAGNOSIS — Z01419 Encounter for gynecological examination (general) (routine) without abnormal findings: Secondary | ICD-10-CM | POA: Diagnosis not present

## 2016-12-25 MED FILL — SERTRALINE HCL 50 MG TABLET: 50 | 90 days supply | Qty: 270 | Fill #3

## 2017-01-25 MED FILL — OMEPRAZOLE DR 40 MG CAPSULE: 40 | 90 days supply | Qty: 90 | Fill #1

## 2017-04-30 ENCOUNTER — Telehealth: Payer: Self-pay | Admitting: Medical

## 2017-04-30 MED FILL — SERTRALINE HCL 50 MG TABLET: 50 | 90 days supply | Qty: 270 | Fill #0

## 2017-04-30 MED FILL — OMEPRAZOLE DR 40 MG CAPSULE: 40 | 90 days supply | Qty: 90 | Fill #0

## 2017-05-03 ENCOUNTER — Telehealth: Payer: Self-pay | Admitting: Medical

## 2017-05-03 ENCOUNTER — Other Ambulatory Visit: Payer: Self-pay | Admitting: Medical

## 2017-05-03 MED ORDER — ALPRAZOLAM 0.5 MG PO TABS: ORAL_TABLET | ORAL | 0 refills | Status: DC

## 2017-05-03 NOTE — Telephone Encounter (Signed)
Call out xanax, #30, per pharmacy refill request

## 2017-05-04 MED FILL — ALPRAZolam 0.5 MG TABS: 0.5 | 30 days supply | Qty: 30 | Fill #0

## 2017-05-04 NOTE — Telephone Encounter (Signed)
Called in refill to [facility]

## 2017-07-09 DIAGNOSIS — R1012 Left upper quadrant pain: Secondary | ICD-10-CM | POA: Diagnosis not present

## 2017-08-19 ENCOUNTER — Ambulatory Visit: Payer: 59 | Admitting: Medical

## 2017-08-19 ENCOUNTER — Encounter: Payer: Self-pay | Admitting: Medical

## 2017-08-19 VITALS — BP 114/76 | HR 78 | Wt 204.2 lb

## 2017-08-19 DIAGNOSIS — R109 Unspecified abdominal pain: Secondary | ICD-10-CM | POA: Diagnosis not present

## 2017-08-19 DIAGNOSIS — M545 Low back pain, unspecified: Secondary | ICD-10-CM

## 2017-08-19 LAB — CBC WITH DIFFERENTIAL/PLATELET
BASOS PCT: 0.7 %
Basophils Absolute: 62 cells/uL (ref 0–200)
Eosinophils Absolute: 249 cells/uL (ref 15–500)
Eosinophils Relative: 2.8 %
HCT: 35.4 % (ref 35.0–45.0)
HEMOGLOBIN: 11.3 g/dL — AB (ref 11.7–15.5)
Lymphs Abs: 2003 cells/uL (ref 850–3900)
MCH: 24.5 pg — ABNORMAL LOW (ref 27.0–33.0)
MCHC: 31.9 g/dL — ABNORMAL LOW (ref 32.0–36.0)
MCV: 76.6 fL — ABNORMAL LOW (ref 80.0–100.0)
MPV: 9.2 fL (ref 7.5–12.5)
Monocytes Relative: 8.5 %
NEUTROS ABS: 5830 {cells}/uL (ref 1500–7800)
Neutrophils Relative %: 65.5 %
PLATELETS: 394 10*3/uL (ref 140–400)
RBC: 4.62 10*6/uL (ref 3.80–5.10)
RDW: 14.8 % (ref 11.0–15.0)
TOTAL LYMPHOCYTE: 22.5 %
WBC: 8.9 10*3/uL (ref 3.8–10.8)
WBCMIX: 757 {cells}/uL (ref 200–950)

## 2017-08-19 LAB — COMPREHENSIVE METABOLIC PANEL
AG RATIO: 1.4 (calc) (ref 1.0–2.5)
ALBUMIN MSPROF: 4.5 g/dL (ref 3.6–5.1)
ALT: 15 U/L (ref 6–29)
AST: 20 U/L (ref 10–30)
Alkaline phosphatase (APISO): 80 U/L (ref 33–115)
BUN / CREAT RATIO: 9 (calc) (ref 6–22)
BUN: 6 mg/dL — ABNORMAL LOW (ref 7–25)
CHLORIDE: 100 mmol/L (ref 98–110)
CO2: 27 mmol/L (ref 20–32)
CREATININE: 0.65 mg/dL (ref 0.50–1.10)
Calcium: 9.7 mg/dL (ref 8.6–10.2)
GLOBULIN: 3.2 g/dL (ref 1.9–3.7)
GLUCOSE: 78 mg/dL (ref 65–99)
POTASSIUM: 4.1 mmol/L (ref 3.5–5.3)
Sodium: 136 mmol/L (ref 135–146)
Total Bilirubin: 0.3 mg/dL (ref 0.2–1.2)
Total Protein: 7.7 g/dL (ref 6.1–8.1)

## 2017-08-19 MED ORDER — CYCLOBENZAPRINE HCL 10 MG PO TABS: ORAL_TABLET | ORAL | 0 refills | Status: DC

## 2017-08-19 MED FILL — CYCLOBENZAPRINE 10 MG TAB: 10 | 10 days supply | Qty: 10 | Fill #0

## 2017-08-19 NOTE — Progress Notes (Signed)
Subjective: Chief Complaint  Patient presents with  . Back Pain    lower back pain on the right side , burning pain , x5 days    Here for back pain, flank pain.   She notes a few weeks before Christmas had some left low back pain, left upper abdominal discomfort, twinges of pain that was sporadic.   She wasn't feeling well.   She ended up seeing Urgent care at [facility].  She reports that the urgent care did labs and an x-ray of her abdomen and reportedly everything was normal.  The pain gradually improved but then the pain has sort of flare from time to time and got worse the last 5 days.  In the last 5 days has had more consistent left low back pain, flank pain, abdominal pressure lower.  Urine has had a little unusual odor but she denies burning with urination, no urinary frequency, no blood in the urine.  No vaginal complaint.  The pain is been worse in the last 24 hours.  She denies fever.  She denies any heavy lifting or strenuous activity.  No other aggravating or relieving factors. No other complaint.  Denies bowel issues no constipation, no diarrhea. No rash no bruising no redness  LMP 07/2617, she is status post Tubal ligation   Past Medical History:  Diagnosis Date  . Acid indigestion    OTC as needed  . Anxiety    panic attacks  . Bimalleolar ankle fracture 08/24/2013   right  . Headache   . Wears contact lenses    Current Outpatient Medications on File Prior to Visit  Medication Sig Dispense Refill  . ALPRAZolam (XANAX) 0.5 MG tablet TAKE 1 TABLET BY MOUTH AT BEDTIME AS NEEDED FOR ANXIETY 30 tablet 0  . omeprazole (PRILOSEC) 40 MG capsule Take 1 capsule (40 mg total) by mouth daily. 90 capsule 1  . sertraline (ZOLOFT) 50 MG tablet Take 3 tablets (150 mg total) by mouth daily. 270 tablet 3  . Vitamin D, Ergocalciferol, (DRISDOL) 50000 units CAPS capsule Take 1 capsule (50,000 Units total) by mouth every 7 (seven) days. 12 capsule 3   No current facility-administered  medications on file prior to visit.    ROS as in subjective   Objective: BP 114/76   Pulse 78   Wt 204 lb 3.2 oz (92.6 kg)   SpO2 98%   BMI 34.78 kg/m   General appearance: Alert, WD/WN, no distress                             Skin: warm, no rash, no diaphoresis, no redness or bruising                            Heart: RRR, normal S1, S2, no murmurs                         Lungs: lungs clear, no rhonchi, no wheezes, no rales Abdomen: +bs, soft, mildly tender in the left upper and lateral and left mid lateral abdomen, otherwise nontender, no mass no organomegaly Back: Slightly tender in left lateral lumbar paraspinal region but otherwise nontender, normal range of motion, no pain with range of motion, no other deformity Nontender with extremities with normal range of motion                Extremities: no edema  Assessment: Encounter Diagnoses  Name Primary?  . Acute left-sided low back pain without sciatica Yes  . Left flank pain   . Abdominal pressure      Plan: We discussed her symptoms and  exam findings.  Etiology unclear.  She has had no obvious injury or activity that would cause a muscle strain, she does not really have specific symptoms, she is mildly tender today on exam.  No history of kidney stone.  She does have a history of UTI.  I will have her try a muscle relaxer tonight in the next few nights.  We talked about stretching and exercise.    Urinalysis today unremarkable.  We will get some labs.  If labs are unremarkable and if not improved over the next few days we can consider imaging.  CT abdomen pelvis would make the most sense to help rule out anything worrisome as her pain is not that specific.  Ixchel was seen today for back pain.  Diagnoses and all orders for this visit:  Acute left-sided low back pain without sciatica -     CBC with Differential/Platelet -     Comprehensive metabolic panel  Left flank pain -     CBC with  Differential/Platelet -     Comprehensive metabolic panel  Abdominal pressure  Other orders -     cyclobenzaprine (FLEXERIL) 10 MG tablet; 1 tablet po QHS prn

## 2017-08-26 ENCOUNTER — Telehealth: Payer: Self-pay | Admitting: Medical

## 2017-08-26 NOTE — Telephone Encounter (Signed)
Pt called and stated that she continues to have pain in her back. She states that the medication she was given did not help. Please advise pt at [phone].

## 2017-08-27 ENCOUNTER — Other Ambulatory Visit: Payer: Self-pay | Admitting: Medical

## 2017-08-27 DIAGNOSIS — M545 Low back pain, unspecified: Secondary | ICD-10-CM

## 2017-08-27 DIAGNOSIS — R109 Unspecified abdominal pain: Secondary | ICD-10-CM

## 2017-08-27 NOTE — Telephone Encounter (Signed)
At this point if her pain is mostly in the back and not in the abdomen at all we could get a back x-ray to start with.  However if she is getting both flank pain or belly pain and back pain then I would recommend let us go ahead and pursue a CT abdomen pelvis.  Does she have any new symptoms or different symptoms than when I saw her?

## 2017-08-27 NOTE — Telephone Encounter (Signed)
I put in orders for lumbar spine x-ray and KUB x-ray of the abdomen.  Per your message she did not want to move forward with a CT scan so we will start with these x-rays.  [facility] 8 and 4:30 Monday through Friday

## 2017-08-27 NOTE — Telephone Encounter (Signed)
Called pt she is  Still having the same pain and before  In the upper mid back ,no new symptoms  pt wants to have the x-ray

## 2017-08-30 MED FILL — SERTRALINE HCL 50 MG TABLET: 50 | 90 days supply | Qty: 270 | Fill #1

## 2017-08-30 MED FILL — OMEPRAZOLE DR 40 MG CAPSULE: 40 | 90 days supply | Qty: 90 | Fill #1

## 2017-08-30 NOTE — Telephone Encounter (Signed)
Pt is going to have the x-ray one this week.

## 2017-08-30 NOTE — Telephone Encounter (Signed)
Pt was to have x-ray first

## 2017-12-05 DIAGNOSIS — H5213 Myopia, bilateral: Secondary | ICD-10-CM | POA: Diagnosis not present

## 2017-12-09 ENCOUNTER — Other Ambulatory Visit: Payer: Self-pay | Admitting: Medical

## 2017-12-09 ENCOUNTER — Telehealth: Payer: Self-pay

## 2017-12-09 MED ORDER — OMEPRAZOLE 40 MG PO CPDR: 40.0000 mg | DELAYED_RELEASE_CAPSULE | Freq: Every day | ORAL | 1 refills | Status: DC

## 2017-12-09 MED FILL — OMEPRAZOLE DR 40 MG CAPSULE: 40 | 90 days supply | Qty: 90 | Fill #0

## 2017-12-09 MED FILL — SERTRALINE HCL 50 MG TABLET: 50 | 90 days supply | Qty: 270 | Fill #0

## 2017-12-09 NOTE — Telephone Encounter (Signed)
rx was previously denied in error

## 2017-12-19 ENCOUNTER — Observation Stay (HOSPITAL_COMMUNITY)
Admission: EM | Admit: 2017-12-19 | Discharge: 2017-12-20 | Disposition: A | Discharge status: Home / Self Care | Payer: 59 | Attending: General Surgery | Admitting: General Surgery

## 2017-12-19 ENCOUNTER — Encounter (HOSPITAL_COMMUNITY)
Admission: EM | Disposition: A | Discharge status: Home / Self Care | Payer: Self-pay | Source: Home / Self Care | Attending: Emergency Medicine

## 2017-12-19 ENCOUNTER — Emergency Department (HOSPITAL_COMMUNITY): Payer: 59

## 2017-12-19 ENCOUNTER — Encounter (HOSPITAL_COMMUNITY): Payer: Self-pay | Admitting: Emergency Medicine

## 2017-12-19 ENCOUNTER — Other Ambulatory Visit (HOSPITAL_COMMUNITY): Payer: 59

## 2017-12-19 ENCOUNTER — Emergency Department (HOSPITAL_COMMUNITY): Payer: 59 | Admitting: Anesthesiology

## 2017-12-19 ENCOUNTER — Other Ambulatory Visit: Payer: Self-pay

## 2017-12-19 DIAGNOSIS — K219 Gastro-esophageal reflux disease without esophagitis: Secondary | ICD-10-CM | POA: Diagnosis not present

## 2017-12-19 DIAGNOSIS — K8080 Other cholelithiasis without obstruction: Secondary | ICD-10-CM | POA: Diagnosis present

## 2017-12-19 DIAGNOSIS — F410 Panic disorder [episodic paroxysmal anxiety] without agoraphobia: Secondary | ICD-10-CM | POA: Diagnosis not present

## 2017-12-19 DIAGNOSIS — R0789 Other chest pain: Secondary | ICD-10-CM | POA: Diagnosis not present

## 2017-12-19 DIAGNOSIS — K819 Cholecystitis, unspecified: Secondary | ICD-10-CM

## 2017-12-19 DIAGNOSIS — Z79899 Other long term (current) drug therapy: Secondary | ICD-10-CM | POA: Insufficient documentation

## 2017-12-19 DIAGNOSIS — F411 Generalized anxiety disorder: Secondary | ICD-10-CM | POA: Diagnosis not present

## 2017-12-19 DIAGNOSIS — E669 Obesity, unspecified: Secondary | ICD-10-CM | POA: Diagnosis not present

## 2017-12-19 DIAGNOSIS — K8000 Calculus of gallbladder with acute cholecystitis without obstruction: Secondary | ICD-10-CM | POA: Diagnosis present

## 2017-12-19 DIAGNOSIS — R079 Chest pain, unspecified: Secondary | ICD-10-CM | POA: Diagnosis not present

## 2017-12-19 DIAGNOSIS — Z6834 Body mass index (BMI) 34.0-34.9, adult: Secondary | ICD-10-CM | POA: Diagnosis not present

## 2017-12-19 DIAGNOSIS — R1110 Vomiting, unspecified: Secondary | ICD-10-CM

## 2017-12-19 DIAGNOSIS — K8010 Calculus of gallbladder with chronic cholecystitis without obstruction: Principal | ICD-10-CM | POA: Insufficient documentation

## 2017-12-19 DIAGNOSIS — K8020 Calculus of gallbladder without cholecystitis without obstruction: Secondary | ICD-10-CM | POA: Diagnosis not present

## 2017-12-19 HISTORY — PX: CHOLECYSTECTOMY: SHX55

## 2017-12-19 LAB — BASIC METABOLIC PANEL
Anion gap: 13 (ref 5–15)
BUN: 8 mg/dL (ref 6–20)
CALCIUM: 11 mg/dL — AB (ref 8.9–10.3)
CO2: 22 mmol/L (ref 22–32)
Chloride: 101 mmol/L (ref 101–111)
Creatinine, Ser: 0.8 mg/dL (ref 0.44–1.00)
GFR calc Af Amer: >60 mL/min (ref >60)
GLUCOSE: 149 mg/dL — AB (ref 65–99)
POTASSIUM: 3.6 mmol/L (ref 3.5–5.1)
Sodium: 136 mmol/L (ref 135–145)

## 2017-12-19 LAB — HEPATIC FUNCTION PANEL
ALT: 23 U/L (ref 14–54)
AST: 23 U/L (ref 15–41)
Albumin: 4.4 g/dL (ref 3.5–5.0)
Alkaline Phosphatase: 81 U/L (ref 38–126)
Bilirubin, Direct: <0.1 mg/dL — ABNORMAL LOW (ref 0.1–0.5)
Total Bilirubin: 0.7 mg/dL (ref 0.3–1.2)
Total Protein: 8.5 g/dL — ABNORMAL HIGH (ref 6.5–8.1)

## 2017-12-19 LAB — I-STAT BETA HCG BLOOD, ED (MC, WL, AP ONLY): I-stat hCG, quantitative: <5 m[IU]/mL (ref <5)

## 2017-12-19 LAB — CBC
HEMATOCRIT: 37.6 % (ref 36.0–46.0)
Hemoglobin: 12.6 g/dL (ref 12.0–15.0)
MCH: 25.2 pg — ABNORMAL LOW (ref 26.0–34.0)
MCHC: 33.5 g/dL (ref 30.0–36.0)
MCV: 75.2 fL — ABNORMAL LOW (ref 78.0–100.0)
Platelets: 366 10*3/uL (ref 150–400)
RBC: 5 MIL/uL (ref 3.87–5.11)
RDW: 16 % — AB (ref 11.5–15.5)
WBC: 14.5 10*3/uL — ABNORMAL HIGH (ref 4.0–10.5)

## 2017-12-19 LAB — TROPONIN I: Troponin I: <0.03 ng/mL (ref <0.03)

## 2017-12-19 LAB — I-STAT TROPONIN, ED: Troponin i, poc: 0 ng/mL (ref 0.00–0.08)

## 2017-12-19 LAB — LIPASE, BLOOD: LIPASE: 24 U/L (ref 11–51)

## 2017-12-19 SURGERY — LAPAROSCOPIC CHOLECYSTECTOMY
Anesthesia: General | Site: Abdomen

## 2017-12-19 MED ORDER — LIDOCAINE 2% (20 MG/ML) 5 ML SYRINGE
INTRAMUSCULAR | Status: AC
  Filled 2017-12-19: qty 5

## 2017-12-19 MED ORDER — DIPHENHYDRAMINE HCL 25 MG PO CAPS: 25.0000 mg | ORAL_CAPSULE | Freq: Four times a day (QID) | ORAL | Status: DC | PRN

## 2017-12-19 MED ORDER — DEXAMETHASONE SODIUM PHOSPHATE 10 MG/ML IJ SOLN
INTRAMUSCULAR | Status: AC
  Filled 2017-12-19: qty 1

## 2017-12-19 MED ORDER — ROCURONIUM BROMIDE 100 MG/10ML IV SOLN
INTRAVENOUS | Status: DC | PRN
  Administered 2017-12-19: 50 mg via INTRAVENOUS
  Administered 2017-12-19: 10 mg via INTRAVENOUS

## 2017-12-19 MED ORDER — MORPHINE SULFATE (PF) 4 MG/ML IV SOLN
2.0000 mg | INTRAVENOUS | Status: DC | PRN
  Administered 2017-12-19: 2 mg via INTRAVENOUS
  Filled 2017-12-19: qty 1

## 2017-12-19 MED ORDER — SUGAMMADEX SODIUM 200 MG/2ML IV SOLN
INTRAVENOUS | Status: AC
  Filled 2017-12-19: qty 2

## 2017-12-19 MED ORDER — ACETAMINOPHEN 650 MG RE SUPP: 650.0000 mg | Freq: Four times a day (QID) | RECTAL | Status: DC | PRN

## 2017-12-19 MED ORDER — FENTANYL CITRATE (PF) 100 MCG/2ML IJ SOLN
INTRAMUSCULAR | Status: DC | PRN
  Administered 2017-12-19: 50 ug via INTRAVENOUS
  Administered 2017-12-19: 100 ug via INTRAVENOUS
  Administered 2017-12-19 (×2): 50 ug via INTRAVENOUS

## 2017-12-19 MED ORDER — SUCCINYLCHOLINE CHLORIDE 200 MG/10ML IV SOSY
PREFILLED_SYRINGE | INTRAVENOUS | Status: AC
  Filled 2017-12-19: qty 10

## 2017-12-19 MED ORDER — CELECOXIB 200 MG PO CAPS
200.0000 mg | ORAL_CAPSULE | Freq: Two times a day (BID) | ORAL | Status: DC
  Administered 2017-12-19 – 2017-12-20 (×2): 200 mg via ORAL
  Filled 2017-12-19 (×2): qty 1

## 2017-12-19 MED ORDER — HYDRALAZINE HCL 20 MG/ML IJ SOLN: 10.0000 mg | INTRAMUSCULAR | Status: DC | PRN

## 2017-12-19 MED ORDER — LIDOCAINE HCL (CARDIAC) PF 100 MG/5ML IV SOSY
PREFILLED_SYRINGE | INTRAVENOUS | Status: DC | PRN
  Administered 2017-12-19: 80 mg via INTRAVENOUS

## 2017-12-19 MED ORDER — ONDANSETRON 4 MG PO TBDP: 4.0000 mg | ORAL_TABLET | Freq: Four times a day (QID) | ORAL | Status: DC | PRN

## 2017-12-19 MED ORDER — SODIUM CHLORIDE 0.9 % IV SOLN
2.0000 g | INTRAVENOUS | Status: DC
  Administered 2017-12-19: 2 g via INTRAVENOUS
  Filled 2017-12-19: qty 20

## 2017-12-19 MED ORDER — LACTATED RINGERS IV BOLUS
1000.0000 mL | Freq: Once | INTRAVENOUS | Status: AC
  Administered 2017-12-19: 1000 mL via INTRAVENOUS

## 2017-12-19 MED ORDER — SCOPOLAMINE 1 MG/3DAYS TD PT72
1.0000 | MEDICATED_PATCH | TRANSDERMAL | Status: DC
  Administered 2017-12-19: 1.5 mg via TRANSDERMAL

## 2017-12-19 MED ORDER — SERTRALINE HCL 50 MG PO TABS
150.0000 mg | ORAL_TABLET | Freq: Every day | ORAL | Status: DC
  Administered 2017-12-19 – 2017-12-20 (×2): 150 mg via ORAL
  Filled 2017-12-19 (×2): qty 1

## 2017-12-19 MED ORDER — FENTANYL CITRATE (PF) 250 MCG/5ML IJ SOLN
INTRAMUSCULAR | Status: AC
  Filled 2017-12-19: qty 5

## 2017-12-19 MED ORDER — ROCURONIUM BROMIDE 50 MG/5ML IV SOLN
INTRAVENOUS | Status: AC
  Filled 2017-12-19: qty 1

## 2017-12-19 MED ORDER — BUPIVACAINE HCL (PF) 0.25 % IJ SOLN
INTRAMUSCULAR | Status: AC
  Filled 2017-12-19: qty 30

## 2017-12-19 MED ORDER — IOHEXOL 300 MG/ML  SOLN
100.0000 mL | Freq: Once | INTRAMUSCULAR | Status: AC | PRN
  Administered 2017-12-19: 100 mL via INTRAVENOUS

## 2017-12-19 MED ORDER — ONDANSETRON HCL 4 MG/2ML IJ SOLN
INTRAMUSCULAR | Status: DC | PRN
  Administered 2017-12-19: 4 mg via INTRAVENOUS

## 2017-12-19 MED ORDER — ONDANSETRON HCL 4 MG/2ML IJ SOLN
4.0000 mg | Freq: Once | INTRAMUSCULAR | Status: AC
  Administered 2017-12-19: 4 mg via INTRAVENOUS
  Filled 2017-12-19: qty 2

## 2017-12-19 MED ORDER — GABAPENTIN 300 MG PO CAPS
300.0000 mg | ORAL_CAPSULE | Freq: Two times a day (BID) | ORAL | Status: DC
  Administered 2017-12-19 – 2017-12-20 (×2): 300 mg via ORAL
  Filled 2017-12-19 (×2): qty 1

## 2017-12-19 MED ORDER — BUPIVACAINE HCL 0.25 % IJ SOLN
INTRAMUSCULAR | Status: DC | PRN
  Administered 2017-12-19: 19 mL

## 2017-12-19 MED ORDER — ONDANSETRON HCL 4 MG/2ML IJ SOLN
INTRAMUSCULAR | Status: AC
  Filled 2017-12-19: qty 2

## 2017-12-19 MED ORDER — IOPAMIDOL (ISOVUE-300) INJECTION 61%
INTRAVENOUS | Status: AC
  Filled 2017-12-19: qty 50

## 2017-12-19 MED ORDER — KETOROLAC TROMETHAMINE 30 MG/ML IJ SOLN
INTRAMUSCULAR | Status: DC | PRN
  Administered 2017-12-19: 30 mg via INTRAVENOUS

## 2017-12-19 MED ORDER — SODIUM CHLORIDE 0.9 % IV SOLN
INTRAVENOUS | Status: DC
  Administered 2017-12-19: 16:00:00 via INTRAVENOUS

## 2017-12-19 MED ORDER — MIDAZOLAM HCL 2 MG/2ML IJ SOLN
INTRAMUSCULAR | Status: AC
  Filled 2017-12-19: qty 2

## 2017-12-19 MED ORDER — DIPHENHYDRAMINE HCL 50 MG/ML IJ SOLN
25.0000 mg | Freq: Four times a day (QID) | INTRAMUSCULAR | Status: DC | PRN
Start: 2017-12-19 — End: 2017-12-20

## 2017-12-19 MED ORDER — HYDROCODONE-ACETAMINOPHEN 5-325 MG PO TABS
1.0000 | ORAL_TABLET | ORAL | Status: DC | PRN
  Administered 2017-12-19 – 2017-12-20 (×2): 1 via ORAL
  Filled 2017-12-19 (×2): qty 1

## 2017-12-19 MED ORDER — HYDROMORPHONE HCL 2 MG/ML IJ SOLN
0.5000 mg | Freq: Once | INTRAMUSCULAR | Status: AC
  Administered 2017-12-19: 0.5 mg via INTRAVENOUS
  Filled 2017-12-19: qty 1

## 2017-12-19 MED ORDER — MIDAZOLAM HCL 5 MG/5ML IJ SOLN
INTRAMUSCULAR | Status: DC | PRN
  Administered 2017-12-19: 2 mg via INTRAVENOUS

## 2017-12-19 MED ORDER — TRAMADOL HCL 50 MG PO TABS: 50.0000 mg | ORAL_TABLET | Freq: Four times a day (QID) | ORAL | Status: DC | PRN

## 2017-12-19 MED ORDER — SUGAMMADEX SODIUM 200 MG/2ML IV SOLN
INTRAVENOUS | Status: DC | PRN
  Administered 2017-12-19: 200 mg via INTRAVENOUS

## 2017-12-19 MED ORDER — ACETAMINOPHEN 325 MG PO TABS: 650.0000 mg | ORAL_TABLET | Freq: Four times a day (QID) | ORAL | Status: DC | PRN

## 2017-12-19 MED ORDER — FAMOTIDINE IN NACL 20-0.9 MG/50ML-% IV SOLN
20.0000 mg | Freq: Once | INTRAVENOUS | Status: AC
  Administered 2017-12-19: 20 mg via INTRAVENOUS
  Filled 2017-12-19: qty 50

## 2017-12-19 MED ORDER — PROPOFOL 10 MG/ML IV BOLUS
INTRAVENOUS | Status: AC
  Filled 2017-12-19: qty 20

## 2017-12-19 MED ORDER — POLYETHYLENE GLYCOL 3350 17 G PO PACK: 17.0000 g | PACK | Freq: Every day | ORAL | Status: DC | PRN

## 2017-12-19 MED ORDER — PROPOFOL 10 MG/ML IV BOLUS
INTRAVENOUS | Status: DC | PRN
  Administered 2017-12-19: 150 mg via INTRAVENOUS

## 2017-12-19 MED ORDER — GI COCKTAIL ~~LOC~~
30.0000 mL | Freq: Once | ORAL | Status: AC
  Administered 2017-12-19: 30 mL via ORAL
  Filled 2017-12-19: qty 30

## 2017-12-19 MED ORDER — ONDANSETRON HCL 4 MG/2ML IJ SOLN: 4.0000 mg | Freq: Four times a day (QID) | INTRAMUSCULAR | Status: DC | PRN

## 2017-12-19 MED ORDER — KETOROLAC TROMETHAMINE 30 MG/ML IJ SOLN
INTRAMUSCULAR | Status: AC
  Filled 2017-12-19: qty 1

## 2017-12-19 MED ORDER — LACTATED RINGERS IV SOLN
INTRAVENOUS | Status: DC
  Administered 2017-12-19 (×2): via INTRAVENOUS

## 2017-12-19 MED ORDER — PHENYLEPHRINE 40 MCG/ML (10ML) SYRINGE FOR IV PUSH (FOR BLOOD PRESSURE SUPPORT)
PREFILLED_SYRINGE | INTRAVENOUS | Status: AC
  Filled 2017-12-19: qty 10

## 2017-12-19 MED ORDER — DEXAMETHASONE SODIUM PHOSPHATE 10 MG/ML IJ SOLN
INTRAMUSCULAR | Status: DC | PRN
  Administered 2017-12-19: 10 mg via INTRAVENOUS

## 2017-12-19 MED ORDER — FENTANYL CITRATE (PF) 100 MCG/2ML IJ SOLN
50.0000 ug | Freq: Once | INTRAMUSCULAR | Status: AC
  Administered 2017-12-19: 50 ug via INTRAVENOUS
  Filled 2017-12-19: qty 2

## 2017-12-19 MED ORDER — SODIUM CHLORIDE 0.9 % IR SOLN
Status: DC | PRN
  Administered 2017-12-19: 1000 mL

## 2017-12-19 MED ORDER — 0.9 % SODIUM CHLORIDE (POUR BTL) OPTIME
TOPICAL | Status: DC | PRN
  Administered 2017-12-19: 1000 mL

## 2017-12-19 SURGICAL SUPPLY — 40 items
ADH SKN CLS APL DERMABOND .7 (GAUZE/BANDAGES/DRESSINGS) ×2
APPLIER CLIP ROT 10 11.4 M/L (STAPLE)
APR CLP MED LRG 11.4X10 (STAPLE)
BAG SPEC RTRVL 10 TROC 200 (ENDOMECHANICALS) ×2
CANISTER SUCT 3000ML PPV (MISCELLANEOUS) ×4 IMPLANT
CHLORAPREP W/TINT 26ML (MISCELLANEOUS) ×4 IMPLANT
CLIP APPLIE ROT 10 11.4 M/L (STAPLE) IMPLANT
CLIP LIGATING HEMO O LOK GREEN (MISCELLANEOUS) ×4 IMPLANT
COVER MAYO STAND STRL (DRAPES) ×1 IMPLANT
COVER SURGICAL LIGHT HANDLE (MISCELLANEOUS) ×4 IMPLANT
DERMABOND ADVANCED (GAUZE/BANDAGES/DRESSINGS) ×2
DERMABOND ADVANCED .7 DNX12 (GAUZE/BANDAGES/DRESSINGS) ×2 IMPLANT
DRAPE C-ARM 42X72 X-RAY (DRAPES) ×1 IMPLANT
ELECT REM PT RETURN 9FT ADLT (ELECTROSURGICAL) ×4
ELECTRODE REM PT RTRN 9FT ADLT (ELECTROSURGICAL) ×2 IMPLANT
GLOVE SURG SS PI 7.0 STRL IVOR (GLOVE) ×4 IMPLANT
GOWN STRL REUS W/ TWL LRG LVL3 (GOWN DISPOSABLE) ×5 IMPLANT
GOWN STRL REUS W/ TWL XL LVL3 (GOWN DISPOSABLE) ×1 IMPLANT
GOWN STRL REUS W/TWL LRG LVL3 (GOWN DISPOSABLE) ×8
GOWN STRL REUS W/TWL XL LVL3 (GOWN DISPOSABLE) ×4
GRASPER SUT TROCAR 14GX15 (MISCELLANEOUS) ×4 IMPLANT
KIT BASIN OR (CUSTOM PROCEDURE TRAY) ×4 IMPLANT
KIT TURNOVER KIT B (KITS) ×4 IMPLANT
NEEDLE 22X1 1/2 (OR ONLY) (NEEDLE) ×4 IMPLANT
NS IRRIG 1000ML POUR BTL (IV SOLUTION) ×4 IMPLANT
PAD ARMBOARD 7.5X6 YLW CONV (MISCELLANEOUS) ×4 IMPLANT
POUCH RETRIEVAL ECOSAC 10 (ENDOMECHANICALS) ×2 IMPLANT
POUCH RETRIEVAL ECOSAC 10MM (ENDOMECHANICALS) ×2
SCISSORS LAP 5X35 DISP (ENDOMECHANICALS) ×4 IMPLANT
SET IRRIG TUBING LAPAROSCOPIC (IRRIGATION / IRRIGATOR) ×4 IMPLANT
SLEEVE ENDOPATH XCEL 5M (ENDOMECHANICALS) ×8 IMPLANT
SPECIMEN JAR SMALL (MISCELLANEOUS) ×4 IMPLANT
STOPCOCK 4 WAY LG BORE MALE ST (IV SETS) ×1 IMPLANT
SUT MNCRL AB 4-0 PS2 18 (SUTURE) ×4 IMPLANT
TOWEL OR 17X24 6PK STRL BLUE (TOWEL DISPOSABLE) ×4 IMPLANT
TRAY LAPAROSCOPIC MC (CUSTOM PROCEDURE TRAY) ×4 IMPLANT
TROCAR XCEL 12X100 BLDLESS (ENDOMECHANICALS) ×4 IMPLANT
TROCAR XCEL NON-BLD 5MMX100MML (ENDOMECHANICALS) ×4 IMPLANT
TUBING INSUFFLATION (TUBING) ×4 IMPLANT
WATER STERILE IRR 1000ML POUR (IV SOLUTION) ×4 IMPLANT

## 2017-12-19 NOTE — H&P (Signed)
Heather Maynard is an 37 y.o. female.   Chief Complaint: abdominal pain HPI: 37 yo female with 1 day of epigastric pain and chest pain radiating to the back. She has had indigestion for years treated with PPI. This pain is much worse than previous pains. She denies vomiting or diarrhea. She denies fevers. Last intake was yesterday at lunch.  Past Medical History:  Diagnosis Date  . Acid indigestion    OTC as needed  . Anxiety    panic attacks  . Bimalleolar ankle fracture 08/24/2013   right  . Headache   . Wears contact lenses     Past Surgical History:  Procedure Laterality Date  . LAPAROSCOPIC TUBAL LIGATION Bilateral 08/01/2013   Procedure: LAPAROSCOPIC TUBAL LIGATION WITH FILSHIE CLIPS;  Surgeon: Margarette Asal, MD;  Location: [facility];  Service: Gynecology;  Laterality: Bilateral;  . ORIF ANKLE FRACTURE Right 08/29/2013   Procedure: OPEN REDUCTION INTERNAL FIXATION (ORIFRIGHT BIMALLEOLAR  ANKLE FRACTURE;  Surgeon: Renette Butters, MD;  Location: [facility];  Service: Orthopedics;  Laterality: Right;    Family History  Problem Relation Age of Onset  . Hypertension Father   . Diabetes Father        insulin  . Heart disease Father        MI  . Anxiety disorder Sister   . Heart disease Maternal Grandmother   . Hypertension Maternal Grandmother   . Heart disease Paternal Grandmother   . Hypertension Paternal Grandmother   . Diabetes Paternal Grandfather        insulin  . Cancer Paternal Aunt        colon   Social History:  reports that she has never smoked. She has never used smokeless tobacco. She reports that she does not drink alcohol or use drugs.  Allergies:  Allergies  Allergen Reactions  . Soap Itching and Other (See Comments)    SKIN IRRITATION     (Not in a hospital admission)  Results for orders placed or performed during the hospital encounter of 12/19/17 (from the past 48 hour(s))  Basic metabolic panel     Status: Abnormal   Collection Time: 12/19/17  3:47 AM  Result Value Ref Range   Sodium 136 135 - 145 mmol/L   Potassium 3.6 3.5 - 5.1 mmol/L   Chloride 101 101 - 111 mmol/L   CO2 22 22 - 32 mmol/L   Glucose, Bld 149 (H) 65 - 99 mg/dL   BUN 8 6 - 20 mg/dL   Creatinine, Ser 0.80 0.44 - 1.00 mg/dL   Calcium 11.0 (H) 8.9 - 10.3 mg/dL   GFR calc non Af Amer >60 >60 mL/min   GFR calc Af Amer >60 >60 mL/min    Comment: (NOTE) The eGFR has been calculated using the CKD EPI equation. This calculation has not been validated in all clinical situations. eGFR's persistently <60 mL/min signify possible Chronic Kidney Disease.    Anion gap 13 5 - 15    Comment: Performed at [facility] [address], [place], [place] [address]  CBC     Status: Abnormal   Collection Time: 12/19/17  3:47 AM  Result Value Ref Range   WBC 14.5 (H) 4.0 - 10.5 K/uL   RBC 5.00 3.87 - 5.11 MIL/uL   Hemoglobin 12.6 12.0 - 15.0 g/dL   HCT 37.6 36.0 - 46.0 %   MCV 75.2 (L) 78.0 - 100.0 fL   MCH 25.2 (L) 26.0 - 34.0  pg   MCHC 33.5 30.0 - 36.0 g/dL   RDW 16.0 (H) 11.5 - 15.5 %   Platelets 366 150 - 400 K/uL    Comment: Performed at [facility], [place] [address], [place], [place] [address]  I-stat troponin, ED     Status: None   Collection Time: 12/19/17  3:56 AM  Result Value Ref Range   Troponin i, poc 0.00 0.00 - 0.08 ng/mL   Comment 3            Comment: Due to the release kinetics of cTnI, a negative result within the first hours of the onset of symptoms does not rule out myocardial infarction with certainty. If myocardial infarction is still suspected, repeat the test at appropriate intervals.   I-Stat beta hCG blood, ED     Status: None   Collection Time: 12/19/17  3:56 AM  Result Value Ref Range   I-stat hCG, quantitative <5.0 <5 mIU/mL   Comment 3            Comment:   GEST. AGE      CONC.  (mIU/mL)   <=1 WEEK        5 - 50     2 WEEKS       50 - 500     3 WEEKS       100 - 10,000     4 WEEKS     1,000 -  30,000        FEMALE AND NON-PREGNANT FEMALE:     LESS THAN 5 mIU/mL   Hepatic function panel     Status: Abnormal   Collection Time: 12/19/17  9:00 AM  Result Value Ref Range   Total Protein 8.5 (H) 6.5 - 8.1 g/dL   Albumin 4.4 3.5 - 5.0 g/dL   AST 23 15 - 41 U/L   ALT 23 14 - 54 U/L   Alkaline Phosphatase 81 38 - 126 U/L   Total Bilirubin 0.7 0.3 - 1.2 mg/dL   Bilirubin, Direct <0.1 (L) 0.1 - 0.5 mg/dL   Indirect Bilirubin NOT CALCULATED 0.3 - 0.9 mg/dL    Comment: Performed at [facility] [address], [place], [place] [address]  Lipase, blood     Status: None   Collection Time: 12/19/17  9:00 AM  Result Value Ref Range   Lipase 24 11 - 51 U/L    Comment: Performed at [facility], [place] [address], [place], [place] [address]  Troponin I     Status: None   Collection Time: 12/19/17  9:00 AM  Result Value Ref Range   Troponin I <0.03 <0.03 ng/mL    Comment: Performed at [facility] [address], [place], [place] [address]   Dg Chest 2 View  Result Date: 12/19/2017 CLINICAL DATA:  37 year old female with chest pain. EXAM: CHEST - 2 VIEW COMPARISON:  None. FINDINGS: The heart size and mediastinal contours are within normal limits. Both lungs are clear. The visualized skeletal structures are unremarkable. IMPRESSION: No active cardiopulmonary disease. Electronically Signed   By: Anner Crete M.D.   On: 12/19/2017 04:10   Ct Abdomen Pelvis W Contrast  Result Date: 12/19/2017 CLINICAL DATA:  Burning chest, abdominal and back pain with nausea. EXAM: CT ABDOMEN AND PELVIS WITH CONTRAST TECHNIQUE: Multidetector CT imaging of the abdomen and pelvis was performed using the standard protocol following bolus administration of intravenous contrast. CONTRAST:  160m OMNIPAQUE IOHEXOL 300 MG/ML  SOLN COMPARISON:  Ultrasound 12/31/2003 FINDINGS: Lower chest: Normal Hepatobiliary: Diffuse fatty change of the liver. No focal liver lesion. Multiple gallstones dependent  within the gallbladder, 1 cm in size and smaller. No CT sign of obstruction or cholecystitis. Pancreas: Normal Spleen: Normal Adrenals/Urinary Tract: Adrenal glands are normal. Kidneys are normal. Bladder is normal. Stomach/Bowel: No bowel abnormality. No sign of obstruction or inflammatory change. Previous appendectomy. Vascular/Lymphatic: Normal Reproductive: Normal Other: No free fluid or air. Musculoskeletal: Normal IMPRESSION: Multiple gallstones dependent within the gallbladder. These are 1 cm in size and smaller. No CT evidence of cholecystitis or obstruction, but ultrasound would be more sensitive. Diffuse fatty change of the liver. Electronically Signed   By: Nelson Chimes M.D.   On: 12/19/2017 10:34    Review of Systems  Constitutional: Negative for chills and fever.  HENT: Negative for hearing loss.   Eyes: Negative for blurred vision and double vision.  Respiratory: Negative for cough and hemoptysis.   Cardiovascular: Negative for chest pain and palpitations.  Gastrointestinal: Positive for abdominal pain and nausea. Negative for vomiting.  Genitourinary: Negative for dysuria and urgency.  Musculoskeletal: Negative for myalgias and neck pain.  Skin: Negative for itching and rash.  Neurological: Negative for dizziness, tingling and headaches.  Endo/Heme/Allergies: Does not bruise/bleed easily.  Psychiatric/Behavioral: Negative for depression and suicidal ideas.    Blood pressure 140/71, pulse 82, temperature 98.4 F (36.9 C), temperature source Oral, resp. rate 18, height 5' 4"  (1.626 m), weight 88.5 kg (195 lb), SpO2 100 %. Physical Exam  Vitals reviewed. Constitutional: She is oriented to person, place, and time. She appears well-developed and well-nourished.  HENT:  Head: Normocephalic and atraumatic.  Eyes: Pupils are equal, round, and reactive to light. Conjunctivae and EOM are normal.  Neck: Normal range of motion. Neck supple.  Cardiovascular: Normal rate and regular  rhythm.  Respiratory: Effort normal and breath sounds normal.  GI: Soft. Bowel sounds are normal. She exhibits no distension. There is tenderness in the right upper quadrant. There is no guarding.  Musculoskeletal: Normal range of motion.  Neurological: She is alert and oriented to person, place, and time.  Skin: Skin is warm and dry.  Psychiatric: She has a normal mood and affect. Her behavior is normal.     Assessment/Plan 37 yo female with epigastric pain, leukocytosis, and multiple large gallstones consistent with acute calculous cholecystitis -lap chole -We discussed the etiology of her pain, we discussed treatment options and recommended surgery. We discussed details of surgery including general anesthesia, laparoscopic approach, identification of cystic duct and common bile duct. Ligation of cystic duct and cystic artery. Possible need for intraoperative cholangiogram or open procedure. Possible risks of common bile duct injury, liver injury, cystic duct leak, bleeding, infection, post-cholecystectomy syndrome. The patient showed good understanding and all questions were answered -admit to surgery -IV abx -NPO until surgery  Mickeal Skinner, MD 12/19/2017, 12:19 PM

## 2017-12-19 NOTE — Op Note (Signed)
PATIENT:  Heather Maynard  37 y.o. female  PRE-OPERATIVE DIAGNOSIS:  cholecystitis  POST-OPERATIVE DIAGNOSIS:  cholecystitis  PROCEDURE:  Procedure(s): LAPAROSCOPIC CHOLECYSTECTOMY   SURGEON:  Surgeon(s): Purvis Sidle, De Blanch, MD  ASSISTANT: none  ANESTHESIA:   local and general  Indications for procedure: [name] is a 37 y.o. female with symptoms of Abdominal pain and Nausea and vomiting consistent with gallbladder disease, Confirmed by CT.  Description of procedure: The patient was brought into the operative suite, placed supine. Anesthesia was administered with endotracheal tube. Patient was strapped in place and foot board was secured. All pressure points were offloaded by foam padding. The patient was prepped and draped in the usual sterile fashion.  A small incision was made to the right of the umbilicus. A 5mm trocar was inserted into the peritoneal cavity with optical entry. Pneumoperitoneum was applied with high flow low pressure. 2 5mm trocars were placed in the RUQ. A 12mm trocar was placed in the subxiphoid space. 20 ml marcaine was infused to the subxiphoid space and lateral upper right abdomen in the preperitoneal spaces. Next the patient was placed in reverse trendelenberg. The gallbladder was tense and edematous and white in color.  The gallbladder was retracted cephalad and lateral. The peritoneum was reflected off the infundibulum working lateral to medial. The cystic duct and cystic artery were identified and further dissection revealed a critical view. The cystic duct and cystic artery were doubly clipped and ligated.   The gallbladder was removed off the liver bed with cautery. The Gallbladder was placed in a specimen bag. The gallbladder fossa was irrigated and hemostasis was applied with cautery. The gallbladder was removed via the 12mm trocar. The fascial defect was closed with interrupted 0 vicryl suture via laparoscopic trans-fascial suture passer.  Pneumoperitoneum was removed, all trocar were removed. All incisions were closed with 4-0 monocryl subcuticular stitch. The patient woke from anesthesia and was brought to PACU in stable condition. All counts were correct  Findings: inflamed gallbladder with multiple large stones  Specimen: gallbladder  Blood loss: 30 ml  Local anesthesia: 20 ml marcaine  Complications: none  PLAN OF CARE: Admit for overnight observation  PATIENT DISPOSITION:  PACU - hemodynamically stable.  Images:          Feliciana Rossetti, M.D. General, Bariatric, & Minimally Invasive Surgery [facility]

## 2017-12-19 NOTE — Anesthesia Procedure Notes (Signed)
Procedure Name: Intubation Date/Time: 12/19/2017 1:24 PM Performed by: Candis Shine, CRNA Pre-anesthesia Checklist: Patient identified, Emergency Drugs available, Suction available and Patient being monitored Patient Re-evaluated:Patient Re-evaluated prior to induction Oxygen Delivery Method: Circle System Utilized Preoxygenation: Pre-oxygenation with 100% oxygen Induction Type: IV induction Ventilation: Mask ventilation without difficulty Laryngoscope Size: Mac and 3 Grade View: Grade I Tube type: Oral Tube size: 7.0 mm Number of attempts: 1 Airway Equipment and Method: Stylet Placement Confirmation: ETT inserted through vocal cords under direct vision,  positive ETCO2 and breath sounds checked- equal and bilateral Secured at: 22 cm Tube secured with: Tape Dental Injury: Teeth and Oropharynx as per pre-operative assessment

## 2017-12-19 NOTE — ED Triage Notes (Signed)
Pt c/o burning chest pain that started at 2000 tonight. Pain radiating to the back, pt c/o nausea and lightheadedness. Denies shortness of breath.

## 2017-12-19 NOTE — Transfer of Care (Signed)
Immediate Anesthesia Transfer of Care Note  Patient: Heather Maynard  Procedure(s) Performed: LAPAROSCOPIC CHOLECYSTECTOMY (N/A Abdomen)  Patient Location: PACU  Anesthesia Type:General  Level of Consciousness: awake, alert  and oriented  Airway & Oxygen Therapy: Patient Spontanous Breathing and Patient connected to nasal cannula oxygen  Post-op Assessment: Report given to RN and Post -op Vital signs reviewed and stable  Post vital signs: Reviewed and stable  Last Vitals:  Vitals Value Taken Time  BP    Temp    Pulse 93 12/19/2017  2:36 PM  Resp 12 12/19/2017  2:36 PM  SpO2 93 % 12/19/2017  2:36 PM  Vitals shown include unvalidated device data.  Last Pain:  Vitals:   12/19/17 1434  TempSrc:   PainSc: (P) 0-No pain         Complications: No apparent anesthesia complications

## 2017-12-19 NOTE — Anesthesia Postprocedure Evaluation (Signed)
Anesthesia Post Note  Patient: Heather Maynard  Procedure(s) Performed: LAPAROSCOPIC CHOLECYSTECTOMY (N/A Abdomen)     Patient location during evaluation: PACU Anesthesia Type: General Level of consciousness: awake and alert Pain management: pain level controlled Vital Signs Assessment: post-procedure vital signs reviewed and stable Respiratory status: spontaneous breathing, nonlabored ventilation and respiratory function stable Cardiovascular status: blood pressure returned to baseline and stable Postop Assessment: no apparent nausea or vomiting Anesthetic complications: no    Last Vitals:  Vitals:   12/19/17 1533 12/19/17 2100  BP: 138/78 (!) 117/56  Pulse: 85 71  Resp:  12  Temp: 37.1 C 37.1 C  SpO2: 98% 96%    Last Pain:  Vitals:   12/19/17 2108  TempSrc:   PainSc: 4                  Cecile Hearing

## 2017-12-19 NOTE — ED Notes (Signed)
Informed consent at bedside. Patient denies further questions or needs.

## 2017-12-19 NOTE — ED Notes (Signed)
CT notified-patient is ready with IV

## 2017-12-19 NOTE — Anesthesia Preprocedure Evaluation (Addendum)
Anesthesia Evaluation  Patient identified by MRN, date of birth, ID band Patient awake    Reviewed: Allergy & Precautions, NPO status , Patient's Chart, lab work & pertinent test results  Airway Mallampati: II  TM Distance: >3 FB Neck ROM: Full    Dental  (+) Teeth Intact, Dental Advisory Given   Pulmonary neg pulmonary ROS,    Pulmonary exam normal breath sounds clear to auscultation       Cardiovascular negative cardio ROS Normal cardiovascular exam Rhythm:Regular Rate:Normal     Neuro/Psych  Headaches, PSYCHIATRIC DISORDERS Anxiety    GI/Hepatic Neg liver ROS, GERD  Medicated and Controlled,Cholecystitis    Endo/Other  negative endocrine ROSObesity   Renal/GU negative Renal ROS     Musculoskeletal negative musculoskeletal ROS (+)   Abdominal   Peds  Hematology negative hematology ROS (+)   Anesthesia Other Findings Day of surgery medications reviewed with the patient.  Reproductive/Obstetrics                            Anesthesia Physical Anesthesia Plan  ASA: II  Anesthesia Plan: General   Post-op Pain Management:    Induction: Intravenous  PONV Risk Score and Plan: 4 or greater and Midazolam, Dexamethasone, Ondansetron and Scopolamine patch - Pre-op  Airway Management Planned: Oral ETT  Additional Equipment:   Intra-op Plan:   Post-operative Plan: Extubation in OR  Informed Consent: I have reviewed the patients History and Physical, chart, labs and discussed the procedure including the risks, benefits and alternatives for the proposed anesthesia with the patient or authorized representative who has indicated his/her understanding and acceptance.   Dental advisory given  Plan Discussed with: CRNA  Anesthesia Plan Comments:         Anesthesia Quick Evaluation

## 2017-12-19 NOTE — ED Provider Notes (Signed)
Emergency Department Provider Note   I have reviewed the triage vital signs and the nursing notes.   HISTORY  Chief Complaint Chest Pain   HPI Heather Maynard is a 37 y.o. female with medical problems as documented below the presents the emergency department today with upper abdominal pain and lower chest pain.  Patient states that she last had a meal yesterday around 1230 in the last night around 7:00 she started having pretty intense indigestion-like pain.  She states she has a history of indigestion and it felt similar to that but more severe.  This continued for many hours she tried multiple positions drinking water different things to make it better.  She had a couple episodes of vomiting that was nonbloody nonbilious.  She had a couple episodes of diarrhea during this time as well.  The pain started to radiate straight through to her back.  She is never had any pain that lasts this long or the severe before so she came here for evaluation.  Since she has been here is eased up a little bit and she still has pretty moderate to severe pain.  No alcohol, drugs or tobacco.  No surgical history. No other associated or modifying symptoms.    Past Medical History:  Diagnosis Date  . Acid indigestion    OTC as needed  . Anxiety    panic attacks  . Bimalleolar ankle fracture 08/24/2013   right  . Headache   . Wears contact lenses     Patient Active Problem List   Diagnosis Date Noted  . Acute calculous cholecystitis 12/19/2017  . Encounter for health maintenance examination in adult 12/31/2015  . Generalized anxiety disorder 12/31/2015  . History of fracture 12/31/2015  . Vitamin D deficiency 12/31/2015  . Gastroesophageal reflux disease without esophagitis 12/31/2015  . Obesity 12/31/2015    Past Surgical History:  Procedure Laterality Date  . LAPAROSCOPIC TUBAL LIGATION Bilateral 08/01/2013   Procedure: LAPAROSCOPIC TUBAL LIGATION WITH FILSHIE CLIPS;  Surgeon: Meriel Pica, MD;  Location: [facility];  Service: Gynecology;  Laterality: Bilateral;  . ORIF ANKLE FRACTURE Right 08/29/2013   Procedure: OPEN REDUCTION INTERNAL FIXATION (ORIFRIGHT BIMALLEOLAR  ANKLE FRACTURE;  Surgeon: Sheral Apley, MD;  Location: [facility];  Service: Orthopedics;  Laterality: Right;      Allergies Soap  Family History  Problem Relation Age of Onset  . Hypertension Father   . Diabetes Father        insulin  . Heart disease Father        MI  . Anxiety disorder Sister   . Heart disease Maternal Grandmother   . Hypertension Maternal Grandmother   . Heart disease Paternal Grandmother   . Hypertension Paternal Grandmother   . Diabetes Paternal Grandfather        insulin  . Cancer Paternal Aunt        colon    Social History Social History   Tobacco Use  . Smoking status: Never Smoker  . Smokeless tobacco: Never Used  Substance Use Topics  . Alcohol use: No  . Drug use: No    Review of Systems  All other systems negative except as documented in the HPI. All pertinent positives and negatives as reviewed in the HPI. ____________________________________________   PHYSICAL EXAM:  VITAL SIGNS: ED Triage Vitals  Enc Vitals Group     BP 12/19/17 0340 (!) 145/83     Pulse Rate 12/19/17 0340 90  Resp 12/19/17 0340 16     Temp 12/19/17 0340 98.4 F (36.9 C)     Temp Source 12/19/17 0340 Oral     SpO2 12/19/17 0340 100 %     Weight 12/19/17 0340 195 lb (88.5 kg)     Height 12/19/17 0340  (1.626 m)    Constitutional: Alert and oriented. Well appearing and in no acute distress. Eyes: Conjunctivae are normal. PERRL. EOMI. Head: Atraumatic. Nose: No congestion/rhinnorhea. Mouth/Throat: Mucous membranes are dry.  Oropharynx non-erythematous. Neck: No stridor.  No meningeal signs.   Cardiovascular: Normal rate, regular rhythm. Good peripheral circulation. Grossly normal heart sounds.   Respiratory: Normal respiratory effort.  No  retractions. Lungs CTAB. Gastrointestinal: Soft and nontender. No distention.  Musculoskeletal: No lower extremity tenderness nor edema. No gross deformities of extremities. Neurologic:  Normal speech and language. No gross focal neurologic deficits are appreciated.  Skin:  Skin is warm, dry and intact. No rash noted.   ____________________________________________   LABS (all labs ordered are listed, but only abnormal results are displayed)  Labs Reviewed  BASIC METABOLIC PANEL - Abnormal; Notable for the following components:      Result Value   Glucose, Bld 149 (*)    Calcium 11.0 (*)    All other components within normal limits  CBC - Abnormal; Notable for the following components:   WBC 14.5 (*)    MCV 75.2 (*)    MCH 25.2 (*)    RDW 16.0 (*)    All other components within normal limits  HEPATIC FUNCTION PANEL - Abnormal; Notable for the following components:   Total Protein 8.5 (*)    Bilirubin, Direct <0.1 (*)    All other components within normal limits  LIPASE, BLOOD  TROPONIN I  I-STAT TROPONIN, ED  I-STAT BETA HCG BLOOD, ED ([facility], [facility], [facility] ONLY)  SURGICAL PATHOLOGY   ____________________________________________  EKG   EKG Interpretation  Date/Time:  Sunday Dec 19 2017 03:36:21 EDT Ventricular Rate:  96 PR Interval:  126 QRS Duration: 86 QT Interval:  340 QTC Calculation: 429 R Axis:   78 Text Interpretation:  Normal sinus rhythm Nonspecific ST abnormality Abnormal ECG LVH and changes c/w same Confirmed by Marily Memos [phone]) on 12/19/2017 8:19:04 AM      ____________________________________________  RADIOLOGY  Dg Chest 2 View  Result Date: 12/19/2017 CLINICAL DATA:  37 year old female with chest pain. EXAM: CHEST - 2 VIEW COMPARISON:  None. FINDINGS: The heart size and mediastinal contours are within normal limits. Both lungs are clear. The visualized skeletal structures are unremarkable. IMPRESSION: No active cardiopulmonary disease. Electronically  Signed   By: Elgie Collard M.D.   On: 12/19/2017 04:10   Ct Abdomen Pelvis W Contrast  Result Date: 12/19/2017 CLINICAL DATA:  Burning chest, abdominal and back pain with nausea. EXAM: CT ABDOMEN AND PELVIS WITH CONTRAST TECHNIQUE: Multidetector CT imaging of the abdomen and pelvis was performed using the standard protocol following bolus administration of intravenous contrast. CONTRAST:  OMNIPAQUE IOHEXOL 300 MG/ML  SOLN COMPARISON:  Ultrasound 12/31/2003 FINDINGS: Lower chest: Normal Hepatobiliary: Diffuse fatty change of the liver. No focal liver lesion. Multiple gallstones dependent within the gallbladder, 1 cm in size and smaller. No CT sign of obstruction or cholecystitis. Pancreas: Normal Spleen: Normal Adrenals/Urinary Tract: Adrenal glands are normal. Kidneys are normal. Bladder is normal. Stomach/Bowel: No bowel abnormality. No sign of obstruction or inflammatory change. Previous appendectomy. Vascular/Lymphatic: Normal Reproductive: Normal Other: No free fluid or air. Musculoskeletal: Normal IMPRESSION:  Multiple gallstones dependent within the gallbladder. These are 1 cm in size and smaller. No CT evidence of cholecystitis or obstruction, but ultrasound would be more sensitive. Diffuse fatty change of the liver. Electronically Signed   By: Paulina Fusi M.D.   On: 12/19/2017 10:34   ____________________________________________   PROCEDURES  Procedure(s) performed:   Procedures   ____________________________________________   INITIAL IMPRESSION / ASSESSMENT AND PLAN / ED COURSE  EKG with LVH.  Unlikely related to that her symptoms today so we will have her follow-up with cardiology her primary care doctor for echocardiogram.  Her pain seems to be more epigastric in nature and radiates to her back consistent with likely pancreatitis versus gallbladder issues.  She does not nausea vomiting shortness of breath but it seems like that is more related to the pain then it is a  cardiac issue.  We will get a CT of her abdomen and pelvis as she has a white count of 16.  She will be dry as she has not had anything to eat or drink since noon yesterday and she had some vomiting diarrhea since that time so we will give IV fluids, nausea medicine pending results of labs.  Will give GI cocktail to see if this helps which would point more towards a gastritis, esophagitis or peptic ulcer.  If this does not help we will give IV opiates.  CT as above. Discussed with surgery who saw patient and agrees with likely cholecsytitis. Will go to OR for same.      Pertinent labs & imaging results that were available during my care of the patient were reviewed by me and considered in my medical decision making (see chart for details).  ____________________________________________  FINAL CLINICAL IMPRESSION(S) / ED DIAGNOSES  Final diagnoses:  Vomiting  Cholecystitis     MEDICATIONS GIVEN DURING THIS VISIT:  Medications  scopolamine (TRANSDERM-SCOP) 1 MG/3DAYS 1.5 mg (1.5 mg Transdermal Patch Applied 12/19/17 1306)  sertraline (ZOLOFT) tablet 150 mg (has no administration in time range)  0.9 %  sodium chloride infusion ( Intravenous New Bag/Given 12/19/17 1546)  HYDROcodone-acetaminophen (NORCO/VICODIN) 5-325 MG per tablet 1-2 tablet (has no administration in time range)  morphine 4 MG/ML injection 2 mg (2 mg Intravenous Given 12/19/17 1545)  hydrALAZINE (APRESOLINE) injection 10 mg (has no administration in time range)  traMADol (ULTRAM) tablet 50 mg (has no administration in time range)  celecoxib (CELEBREX) capsule 200 mg (has no administration in time range)  gabapentin (NEURONTIN) capsule 300 mg (has no administration in time range)  acetaminophen (TYLENOL) tablet 650 mg (has no administration in time range)    Or  acetaminophen (TYLENOL) suppository 650 mg (has no administration in time range)  diphenhydrAMINE (BENADRYL) capsule 25 mg (has no administration in time range)     Or  diphenhydrAMINE (BENADRYL) injection 25 mg (has no administration in time range)  ondansetron (ZOFRAN-ODT) disintegrating tablet 4 mg (has no administration in time range)    Or  ondansetron (ZOFRAN) injection 4 mg (has no administration in time range)  polyethylene glycol (MIRALAX / GLYCOLAX) packet 17 g (has no administration in time range)  lactated ringers bolus 1,000 mL (0 mLs Intravenous Stopped 12/19/17 1115)  gi cocktail (Maalox,Lidocaine,Donnatal) (30 mLs Oral Given 12/19/17 0906)  famotidine (PEPCID) IVPB 20 mg premix (0 mg Intravenous Stopped 12/19/17 0947)  ondansetron (ZOFRAN) injection 4 mg (4 mg Intravenous Given 12/19/17 0919)  fentaNYL (SUBLIMAZE) injection 50 mcg (50 mcg Intravenous Given 12/19/17 1051)  iohexol (OMNIPAQUE) 300 MG/ML  solution 100 mL (100 mLs Intravenous Contrast Given 12/19/17 1016)  HYDROmorphone (DILAUDID) injection 0.5 mg (0.5 mg Intravenous Given 12/19/17 1204)     NEW OUTPATIENT MEDICATIONS STARTED DURING THIS VISIT:  Current Discharge Medication List      Note:  This note was prepared with assistance of Dragon voice recognition software. Occasional wrong-word or sound-a-like substitutions may have occurred due to the inherent limitations of voice recognition software.   Marily Memos, MD 12/19/17 [phone]

## 2017-12-20 ENCOUNTER — Encounter (HOSPITAL_COMMUNITY): Payer: Self-pay | Admitting: General Surgery

## 2017-12-20 ENCOUNTER — Other Ambulatory Visit: Payer: Self-pay

## 2017-12-20 DIAGNOSIS — Z6834 Body mass index (BMI) 34.0-34.9, adult: Secondary | ICD-10-CM | POA: Diagnosis not present

## 2017-12-20 DIAGNOSIS — Z79899 Other long term (current) drug therapy: Secondary | ICD-10-CM | POA: Diagnosis not present

## 2017-12-20 DIAGNOSIS — K219 Gastro-esophageal reflux disease without esophagitis: Secondary | ICD-10-CM | POA: Diagnosis not present

## 2017-12-20 DIAGNOSIS — E669 Obesity, unspecified: Secondary | ICD-10-CM | POA: Diagnosis not present

## 2017-12-20 DIAGNOSIS — K8010 Calculus of gallbladder with chronic cholecystitis without obstruction: Secondary | ICD-10-CM | POA: Diagnosis not present

## 2017-12-20 DIAGNOSIS — F410 Panic disorder [episodic paroxysmal anxiety] without agoraphobia: Secondary | ICD-10-CM | POA: Diagnosis not present

## 2017-12-20 MED ORDER — IBUPROFEN 400 MG PO TABS: 400.0000 mg | ORAL_TABLET | Freq: Three times a day (TID) | ORAL | 0 refills | Status: AC

## 2017-12-20 MED ORDER — HYDROCODONE-ACETAMINOPHEN 5-325 MG PO TABS: 1.0000 | ORAL_TABLET | Freq: Four times a day (QID) | ORAL | 0 refills | Status: DC | PRN

## 2017-12-20 MED FILL — HYDROCODON-APAP 5-325: 5-325 | 4 days supply | Qty: 15 | Fill #0

## 2017-12-20 NOTE — Discharge Instructions (Signed)

## 2017-12-20 NOTE — Progress Notes (Signed)
Patient discharged to home with instructions and prescription. 

## 2017-12-20 NOTE — Discharge Summary (Signed)
Physician Discharge Summary  Patient ID: Heather Maynard MRN: [id number] DOB/AGE: Oct 06, 1980 37 y.o.  Admit date: 12/19/2017 Discharge date: 12/20/2017  Admission Diagnoses: Cholecystitis  Discharge Diagnoses:  Active Problems:   Acute calculous cholecystitis   Discharged Condition: good  Hospital Course: 77 yof with cholecystitis admitted for laparoscopic cholecystectomy. She has done well. The following morning tol diet, pain controlled, voiding and ambulating. She is ready for dc  Consults: None  Significant Diagnostic Studies: none  Treatments: surgery: laparoscopic cholecystectomy  Discharge Exam: Blood pressure (!) 108/57, pulse 64, temperature 98.1 F (36.7 C), temperature source Oral, resp. rate 12, height  (1.626 m), weight 91.4 kg (201 lb 8 oz), SpO2 100 %. GI: soft approp tender incisions clean  Disposition: home    Follow-up Information    [facility], [place]. Call.   Specialty:  General Surgery Why:  We are working on your appointment, please call to confirm. Please arrive 30 minutes prior to your appointment to check in and fill out paperwork. Bring photo ID and insurance information. Contact information: [address] [place] [address] [phone]          Signed: Emelia Loron 12/20/2017, 8:14 AM

## 2017-12-27 ENCOUNTER — Ambulatory Visit (HOSPITAL_COMMUNITY)
Admission: RE | Admit: 2017-12-27 | Discharge: 2017-12-27 | Disposition: A | Discharge status: Home / Self Care | Payer: 59 | Source: Ambulatory Visit | Attending: General Surgery | Admitting: General Surgery

## 2017-12-27 ENCOUNTER — Other Ambulatory Visit (HOSPITAL_COMMUNITY): Payer: Self-pay | Admitting: General Surgery

## 2017-12-27 DIAGNOSIS — K760 Fatty (change of) liver, not elsewhere classified: Secondary | ICD-10-CM | POA: Insufficient documentation

## 2017-12-27 DIAGNOSIS — Z9049 Acquired absence of other specified parts of digestive tract: Secondary | ICD-10-CM | POA: Diagnosis not present

## 2017-12-27 DIAGNOSIS — M549 Dorsalgia, unspecified: Secondary | ICD-10-CM | POA: Insufficient documentation

## 2017-12-27 DIAGNOSIS — K7689 Other specified diseases of liver: Secondary | ICD-10-CM | POA: Diagnosis not present

## 2018-02-08 DIAGNOSIS — Z01419 Encounter for gynecological examination (general) (routine) without abnormal findings: Secondary | ICD-10-CM | POA: Diagnosis not present

## 2018-02-08 DIAGNOSIS — Z6835 Body mass index (BMI) 35.0-35.9, adult: Secondary | ICD-10-CM | POA: Diagnosis not present

## 2018-02-08 DIAGNOSIS — N912 Amenorrhea, unspecified: Secondary | ICD-10-CM | POA: Diagnosis not present

## 2018-02-08 DIAGNOSIS — N760 Acute vaginitis: Secondary | ICD-10-CM | POA: Diagnosis not present

## 2018-02-08 DIAGNOSIS — D649 Anemia, unspecified: Secondary | ICD-10-CM | POA: Diagnosis not present

## 2018-02-08 MED FILL — PROGESTERONE MICRONIZED 200: 200 | 10 days supply | Qty: 10 | Fill #0

## 2018-02-09 LAB — TSH: TSH: 5.42 (ref 0.41–5.90)

## 2018-02-09 LAB — IRON,TIBC AND FERRITIN PANEL
%SAT: 9
Ferritin: 19
Iron: 35
TIBC: 386

## 2018-02-17 ENCOUNTER — Ambulatory Visit: Payer: 59 | Admitting: Medical

## 2018-02-17 VITALS — BP 124/80 | HR 76 | Temp 98.9°F | Resp 16 | Ht 64.0 in | Wt 205.6 lb

## 2018-02-17 DIAGNOSIS — K219 Gastro-esophageal reflux disease without esophagitis: Secondary | ICD-10-CM | POA: Diagnosis not present

## 2018-02-17 DIAGNOSIS — R635 Abnormal weight gain: Secondary | ICD-10-CM | POA: Diagnosis not present

## 2018-02-17 DIAGNOSIS — Z8249 Family history of ischemic heart disease and other diseases of the circulatory system: Secondary | ICD-10-CM | POA: Diagnosis not present

## 2018-02-17 DIAGNOSIS — F411 Generalized anxiety disorder: Secondary | ICD-10-CM | POA: Diagnosis not present

## 2018-02-17 DIAGNOSIS — R5383 Other fatigue: Secondary | ICD-10-CM

## 2018-02-17 DIAGNOSIS — R9431 Abnormal electrocardiogram [ECG] [EKG]: Secondary | ICD-10-CM | POA: Insufficient documentation

## 2018-02-17 DIAGNOSIS — M7989 Other specified soft tissue disorders: Secondary | ICD-10-CM | POA: Diagnosis not present

## 2018-02-17 DIAGNOSIS — Z9049 Acquired absence of other specified parts of digestive tract: Secondary | ICD-10-CM | POA: Diagnosis not present

## 2018-02-17 DIAGNOSIS — E039 Hypothyroidism, unspecified: Secondary | ICD-10-CM | POA: Diagnosis not present

## 2018-02-17 MED ORDER — ALPRAZOLAM 0.5 MG PO TABS: 0.5000 mg | ORAL_TABLET | Freq: Every evening | ORAL | 0 refills | Status: DC | PRN

## 2018-02-17 MED ORDER — LEVOTHYROXINE SODIUM 50 MCG PO TABS: 50.0000 ug | ORAL_TABLET | Freq: Every day | ORAL | 0 refills | Status: DC

## 2018-02-17 MED FILL — ALPRAZolam 0.5 MG TABS: 0.5 | 30 days supply | Qty: 30 | Fill #0

## 2018-02-17 MED FILL — LEVOTHYROXINE 50 MCG TABLET: 50 | 30 days supply | Qty: 30 | Fill #0

## 2018-02-17 NOTE — Progress Notes (Signed)
Subjective: Chief Complaint  Patient presents with  . Follow-up    discuss thyroid and ekg results   Here for concerns.    Went to gyn recently,  had pap smear, routine labs.  Thyroid labs were abnormal, hypothyroid.  Advised to f/u here with PCP.  Over the last few months she is not having a collection of symptoms including swelling in her hands, hard to get her rings off, she has gained some weight, she feels fatigued all the time, despite eating less calories.  Her iron was recently low and she was started on once daily iron by gynecology]] since last visit here she has had a gallbladder taken out  During the emergency department visit for chest and belly pain during the gallbladder episode her EKG was apparently abnormal appearing  She is compliant with her medications for anxiety would like a refill on Xanax  No other aggravating or relieving factors. No other complaint.   Past Medical History:  Diagnosis Date  . Acid indigestion    OTC as needed  . Anxiety    panic attacks  . Bimalleolar ankle fracture 08/24/2013   right  . Headache   . Wears contact lenses    Current Outpatient Medications on File Prior to Visit  Medication Sig Dispense Refill  . omeprazole (PRILOSEC) 40 MG capsule Take 1 capsule (40 mg total) by mouth daily. 90 capsule 1  . progesterone (PROMETRIUM) 200 MG capsule   0  . sertraline (ZOLOFT) 50 MG tablet TAKE 3 TABLETS (150 MG TOTAL) BY MOUTH DAILY. 270 tablet 0   No current facility-administered medications on file prior to visit.    ROS as in subjective    Objective: BP 124/80   Pulse 76   Temp 98.9 F (37.2 C) (Oral)   Resp 16   Ht 5\' 4"  (1.626 m)   Wt 205 lb 9.6 oz (93.3 kg)   SpO2 98%   BMI 35.29 kg/m   Wt Readings from Last 3 Encounters:  02/17/18 205 lb 9.6 oz (93.3 kg)  12/19/17 201 lb 8 oz (91.4 kg)  08/19/17 204 lb 3.2 oz (92.6 kg)   General appearance: alert, no distress, WD/WN,  HEENT: normocephalic, sclerae anicteric,  TMs pearly, nares patent, no discharge or erythema, pharynx normal Oral cavity: MMM, no lesions Neck: supple, no lymphadenopathy, no thyromegaly, no masses Heart: RRR, normal S1, S2, no murmurs Lungs: CTA bilaterally, no wheezes, rhonchi, or rales Abdomen: +bs, soft, non tender, non distended, no masses, no hepatomegaly, no splenomegaly Pulses: 2+ symmetric, upper and lower extremities, normal cap refill Ext: no edema CN II through XII intact, nonfocal exam   Assessment: Encounter Diagnoses  Name Primary?  . Hypothyroidism, unspecified type Yes  . Other fatigue   . Family history of heart disease   . S/P cholecystectomy   . Generalized anxiety disorder   . Gastroesophageal reflux disease without esophagitis   . Abnormal EKG   . Weight gain   . Swelling of both hands      Plan: Hypothyroidism-new diagnosis, reviewed recent labs from gynecology, discussed symptoms, discussed treatment recommendations.  Begin levothyroxine.  Discussed proper use of medication follow-up in 1 month  Fatigue- multifactorial  Family history of heart disease, abnormal EKG-reviewed the EKG from the recent emergency department visit.  Let us plan to recheck this in 2 to 3 months with potential referral to cardiology at that time  She is status post cholecystectomy since last visit here  Anxiety-continue current medication  Some  of her symptoms could be related to the thyroid.  We will monitor her progress as she starts the medication for her thyroid   Heather Maynard was seen today for follow-up.  Diagnoses and all orders for this visit:  Hypothyroidism, unspecified type  Other fatigue  Family history of heart disease  S/P cholecystectomy  Generalized anxiety disorder  Gastroesophageal reflux disease without esophagitis  Abnormal EKG  Weight gain  Swelling of both hands  Other orders -     levothyroxine (SYNTHROID, LEVOTHROID) 50 MCG tablet; Take 1 tablet (50 mcg total) by mouth daily. -      ALPRAZolam (XANAX) 0.5 MG tablet; Take 1 tablet (0.5 mg total) by mouth at bedtime as needed for anxiety.   Follow-up in 1 month

## 2018-02-21 ENCOUNTER — Encounter: Payer: Self-pay | Admitting: Medical

## 2018-03-18 ENCOUNTER — Other Ambulatory Visit: Payer: Self-pay | Admitting: Medical

## 2018-03-18 ENCOUNTER — Telehealth: Payer: Self-pay | Admitting: Internal Medicine

## 2018-03-18 MED FILL — LEVOTHYROXINE 50 MCG TABLET: 50 | 30 days supply | Qty: 30 | Fill #0

## 2018-03-18 NOTE — Telephone Encounter (Signed)
Looks like  Pt is on the nurse visit for Monday for repeat TSH. Please put in future orders

## 2018-03-18 NOTE — Telephone Encounter (Signed)
Due for 74mo f/u.    Make appt, send in refill

## 2018-03-18 NOTE — Telephone Encounter (Signed)
Is this ok to refill?  

## 2018-03-18 NOTE — Telephone Encounter (Signed)
Left message to call back and make an appointment for 1 month f/u.

## 2018-03-21 ENCOUNTER — Other Ambulatory Visit: Payer: Self-pay

## 2018-03-21 ENCOUNTER — Other Ambulatory Visit: Payer: Self-pay | Admitting: Medical

## 2018-03-21 DIAGNOSIS — E039 Hypothyroidism, unspecified: Secondary | ICD-10-CM

## 2018-03-23 ENCOUNTER — Other Ambulatory Visit: Payer: 59

## 2018-03-23 DIAGNOSIS — E039 Hypothyroidism, unspecified: Secondary | ICD-10-CM

## 2018-03-24 ENCOUNTER — Other Ambulatory Visit: Payer: Self-pay | Admitting: Medical

## 2018-03-24 DIAGNOSIS — E039 Hypothyroidism, unspecified: Secondary | ICD-10-CM

## 2018-03-24 LAB — TSH: TSH: 3.04 u[IU]/mL (ref 0.450–4.500)

## 2018-03-24 LAB — T4, FREE: Free T4: 1 ng/dL (ref 0.82–1.77)

## 2018-03-24 MED ORDER — LEVOTHYROXINE SODIUM 50 MCG PO TABS: 50.0000 ug | ORAL_TABLET | Freq: Every day | ORAL | 2 refills | Status: DC

## 2018-04-04 ENCOUNTER — Other Ambulatory Visit: Payer: Self-pay | Admitting: Medical

## 2018-04-04 MED FILL — SERTRALINE HCL 50 MG TABLET: 50 | 90 days supply | Qty: 270 | Fill #0

## 2018-04-04 MED FILL — OMEPRAZOLE 40 MG CPDR: 40 | 90 days supply | Qty: 90 | Fill #1

## 2018-04-04 NOTE — Telephone Encounter (Signed)
Is this ok to refill?  

## 2018-04-26 ENCOUNTER — Telehealth: Payer: Self-pay | Admitting: Medical

## 2018-04-26 MED FILL — LEVOTHYROXINE 50 MCG TABLET: 50 | 30 days supply | Qty: 30 | Fill #0

## 2018-04-26 NOTE — Telephone Encounter (Signed)
Pt is coming in tomorrow to get her TSH labs and she wants to get her IRON checked also, states she is suppose to get that done for her OBGYN she just wanted to get it done here so she didn't have to do 2 separate  visits pt can be reached at [phone]

## 2018-04-27 ENCOUNTER — Other Ambulatory Visit: Payer: 59

## 2018-04-27 ENCOUNTER — Other Ambulatory Visit: Payer: Self-pay | Admitting: Medical

## 2018-04-27 DIAGNOSIS — R5383 Other fatigue: Secondary | ICD-10-CM | POA: Diagnosis not present

## 2018-04-27 DIAGNOSIS — E039 Hypothyroidism, unspecified: Secondary | ICD-10-CM

## 2018-04-28 LAB — TSH: TSH: 3.17 u[IU]/mL (ref 0.450–4.500)

## 2018-04-28 LAB — IRON: IRON: 22 ug/dL — AB (ref 27–159)

## 2018-05-18 ENCOUNTER — Encounter: Payer: Self-pay | Admitting: Medical

## 2018-05-18 ENCOUNTER — Ambulatory Visit: Payer: 59 | Admitting: Medical

## 2018-05-18 VITALS — BP 120/80 | HR 80 | Temp 98.1°F | Resp 16 | Ht 64.0 in | Wt 207.8 lb

## 2018-05-18 DIAGNOSIS — R400 Somnolence: Secondary | ICD-10-CM | POA: Diagnosis not present

## 2018-05-18 DIAGNOSIS — D509 Iron deficiency anemia, unspecified: Secondary | ICD-10-CM

## 2018-05-18 DIAGNOSIS — R5383 Other fatigue: Secondary | ICD-10-CM

## 2018-05-18 DIAGNOSIS — E039 Hypothyroidism, unspecified: Secondary | ICD-10-CM

## 2018-05-18 DIAGNOSIS — R079 Chest pain, unspecified: Secondary | ICD-10-CM | POA: Diagnosis not present

## 2018-05-18 DIAGNOSIS — F411 Generalized anxiety disorder: Secondary | ICD-10-CM

## 2018-05-18 DIAGNOSIS — R0683 Snoring: Secondary | ICD-10-CM

## 2018-05-18 DIAGNOSIS — R0602 Shortness of breath: Secondary | ICD-10-CM

## 2018-05-18 DIAGNOSIS — E669 Obesity, unspecified: Secondary | ICD-10-CM | POA: Diagnosis not present

## 2018-05-18 NOTE — Progress Notes (Signed)
Subjective: Chief Complaint  Patient presents with  . sob    sob, iron defincient chest pain when lying down, dry cough X 2-3 weeks   Here for chest pain x a few weeks.   When lying down at night, sometimes feels like something sitting on her chest.    Aches, sometimes pain in middle of chest, sometimes worse lying on left side.   Sits up to relieve the pain.   Since this has been going, having worse anxiety, feeling SOB at times.  Gets SOB with exercise but not chest pain.   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     Taking iron once daily for anemia, been on this 3 months  On levothyroxine generic for thyroid, started about 3 months ago.  No pain or swelling in calves, no recent injury or trauma,  No recent travel.   Not on birth control.   Nonsmoker.    Possible blood clot or stroke in paternal side, father has known heart disease.   She does snore, she does endorse daytime somnolence, not feeling rested in the morning.  No witnessed apnea though.  No family history of sleep apnea.  No prior sleep study.  No other aggravating or relieving factors. No other complaint.   Past Medical History:  Diagnosis Date  . Acid indigestion    OTC as needed  . Anxiety    panic attacks  . Bimalleolar ankle fracture 08/24/2013   right  . Headache   . Wears contact lenses    Current Outpatient Medications on File Prior to Visit  Medication Sig Dispense Refill  . ALPRAZolam (XANAX) 0.5 MG tablet Take 1 tablet (0.5 mg total) by mouth at bedtime as needed for anxiety. 30 tablet 0  . Ferrous Sulfate (IRON) 90 (18 Fe) MG TABS Take by mouth.    . levothyroxine (SYNTHROID, LEVOTHROID) 50 MCG tablet Take 1 tablet (50 mcg total) by mouth daily. 30 tablet 2  . omeprazole (PRILOSEC) 40 MG capsule Take 1 capsule (40 mg total) by mouth daily. 90 capsule 1  . sertraline (ZOLOFT) 50 MG tablet TAKE 3 TABLETS BY MOUTH DAILY. 270 tablet 0  . progesterone (PROMETRIUM) 200 MG capsule   0   No current  facility-administered medications on file prior to visit.    ROS as in subjective     Objective: BP 120/80   Pulse 80   Temp 98.1 F (36.7 C) (Oral)   Resp 16   Ht 5\' 4"  (1.626 m)   Wt 207 lb 12.8 oz (94.3 kg)   LMP 04/27/2018 (Exact Date)   SpO2 98%   BMI 35.67 kg/m   Wt Readings from Last 3 Encounters:  05/18/18 207 lb 12.8 oz (94.3 kg)  02/17/18 205 lb 9.6 oz (93.3 kg)  12/19/17 201 lb 8 oz (91.4 kg)   General appearence: alert, no distress, WD/WN,  Oral cavity: MMM, no lesions Neck: supple, no lymphadenopathy, no thyromegaly, no masses, no JVD Heart: RRR, normal S1, S2, no murmurs Lungs: CTA bilaterally, no wheezes, rhonchi, or rales Abdomen: +bs, soft, mild left lower quadrant tenderness, otherwise non tender, non distended, no masses, no hepatomegaly, no splenomegaly Pulses: 2+ symmetric, upper and lower extremities, normal cap refill Ext: no edema   Adult ECG Report  Indication: chest pain  Rate: 76 bpm  Rhythm: normal sinus rhythm  QRS Axis: 48 degrees  PR Interval: 132 ms  QRS Duration: Neva [phone]<MEASUREMENT3Neva [phone]<MEASUREMENTMatildEduardo OsAJu<MEASUREMENTMatild2Neva [phone]<MEASUREMENTMatildEduarNeva [phone]<MEASUREMENTMatildEduardo OsAdrParis RegJunMatildEduardo OsAdrNorth Cresce9[id number] [place]  none  Patient's cardiac risk factors are: obesity (BMI >= 30 kg/m2).  EKG comparison: 12/2017  Narrative Interpretation: T wave inversion V3, otherwise no new changes   Assessment: Encounter Diagnoses  Name Primary?  . Chest pain, unspecified type Yes  . Iron deficiency anemia, unspecified iron deficiency anemia type   . Hypothyroidism, unspecified type   . Obesity with serious comorbidity, unspecified classification, unspecified obesity type   . Generalized anxiety disorder   . SOB (shortness of breath)   . Fatigue, unspecified type   . Snoring   . Daytime somnolence      Plan: We discussed the differential, she has iron deficiency anemia and hypothyroidism which are both relatively new diagnosis in the last 3 months.  She is still taking iron therapy although her recent iron was low.   Gynecology was managing this but at this point we will go ahead and recheck some labs likely refer to GI to evaluate for other sources of iron deficiency anemia.  Given her symptoms she may need a sleep study.  I reviewed her recent labs in the chart record in the past 3 months.  If worsens in the meantime call return or go to emergency department  Narcissa was seen today for sob.  Diagnoses and all orders for this visit:  Chest pain, unspecified type -     CBC with Differential/Platelet -     EKG 12-Lead -     Basic metabolic panel -     D-dimer, quantitative (not at [facility])  Iron deficiency anemia, unspecified iron deficiency anemia type -     CBC with Differential/Platelet  Hypothyroidism, unspecified type  Obesity with serious comorbidity, unspecified classification, unspecified obesity type  Generalized anxiety disorder  SOB (shortness of breath) -     CBC with Differential/Platelet -     EKG 12-Lead -     Basic metabolic panel -     D-dimer, quantitative (not at [facility])  Fatigue, unspecified type -     CBC with Differential/Platelet -     EKG 12-Lead -     Basic metabolic panel  Snoring  Daytime somnolence

## 2018-05-19 ENCOUNTER — Other Ambulatory Visit: Payer: Self-pay

## 2018-05-19 ENCOUNTER — Other Ambulatory Visit: Payer: Self-pay | Admitting: Medical

## 2018-05-19 ENCOUNTER — Telehealth: Payer: Self-pay | Admitting: Family Medicine

## 2018-05-19 ENCOUNTER — Ambulatory Visit
Admission: RE | Admit: 2018-05-19 | Discharge: 2018-05-19 | Disposition: A | Discharge status: Home / Self Care | Payer: 59 | Source: Ambulatory Visit | Attending: Medical | Admitting: Medical

## 2018-05-19 DIAGNOSIS — R0602 Shortness of breath: Secondary | ICD-10-CM

## 2018-05-19 DIAGNOSIS — R7989 Other specified abnormal findings of blood chemistry: Secondary | ICD-10-CM

## 2018-05-19 DIAGNOSIS — R079 Chest pain, unspecified: Secondary | ICD-10-CM

## 2018-05-19 DIAGNOSIS — R0609 Other forms of dyspnea: Secondary | ICD-10-CM | POA: Diagnosis not present

## 2018-05-19 LAB — CBC WITH DIFFERENTIAL/PLATELET
BASOS ABS: 0.1 10*3/uL (ref 0.0–0.2)
Basos: 1 %
EOS (ABSOLUTE): 0.3 10*3/uL (ref 0.0–0.4)
Eos: 2 %
HEMATOCRIT: 38.9 % (ref 34.0–46.6)
Hemoglobin: 12.4 g/dL (ref 11.1–15.9)
IMMATURE GRANULOCYTES: 0 %
Immature Grans (Abs): 0 10*3/uL (ref 0.0–0.1)
LYMPHS ABS: 2.2 10*3/uL (ref 0.7–3.1)
LYMPHS: 18 %
MCH: 25.1 pg — ABNORMAL LOW (ref 26.6–33.0)
MCHC: 31.9 g/dL (ref 31.5–35.7)
MCV: 79 fL (ref 79–97)
MONOCYTES: 9 %
Monocytes Absolute: 1 10*3/uL — ABNORMAL HIGH (ref 0.1–0.9)
NEUTROS PCT: 70 %
Neutrophils Absolute: 8.4 10*3/uL — ABNORMAL HIGH (ref 1.4–7.0)
PLATELETS: 396 10*3/uL (ref 150–450)
RBC: 4.94 x10E6/uL (ref 3.77–5.28)
RDW: 15.7 % — AB (ref 12.3–15.4)
WBC: 11.9 10*3/uL — AB (ref 3.4–10.8)

## 2018-05-19 LAB — BASIC METABOLIC PANEL
BUN/Creatinine Ratio: 14 (ref 9–23)
BUN: 8 mg/dL (ref 6–20)
CHLORIDE: 99 mmol/L (ref 96–106)
CO2: 22 mmol/L (ref 20–29)
Calcium: 10.1 mg/dL (ref 8.7–10.2)
Creatinine, Ser: 0.58 mg/dL (ref 0.57–1.00)
GFR calc Af Amer: 136 mL/min/{1.73_m2} (ref >59)
GFR calc non Af Amer: 118 mL/min/{1.73_m2} (ref >59)
Glucose: 89 mg/dL (ref 65–99)
POTASSIUM: 4.1 mmol/L (ref 3.5–5.2)
SODIUM: 139 mmol/L (ref 134–144)

## 2018-05-19 LAB — D-DIMER, QUANTITATIVE: D-DIMER: 0.68 mg/L FEU — ABNORMAL HIGH (ref 0.00–0.49)

## 2018-05-19 MED ORDER — IOPAMIDOL (ISOVUE-370) INJECTION 76%
75.0000 mL | Freq: Once | INTRAVENOUS | Status: AC | PRN
  Administered 2018-05-19: 75 mL via INTRAVENOUS

## 2018-05-19 NOTE — Telephone Encounter (Signed)
Heather Maynard with [facility] called and states no Pulmonary Embolism.  Patient has Fatty Liver.  Rutherford Limerick, he is out of office, he said ok to tell patient no blood clots.  Called pt advised no blood clots and that Vincenza Hews would call her later.

## 2018-05-20 ENCOUNTER — Other Ambulatory Visit: Payer: Self-pay | Admitting: Medical

## 2018-05-20 DIAGNOSIS — D72829 Elevated white blood cell count, unspecified: Secondary | ICD-10-CM

## 2018-05-20 DIAGNOSIS — E039 Hypothyroidism, unspecified: Secondary | ICD-10-CM

## 2018-05-20 NOTE — Telephone Encounter (Signed)
I called patient about her CT and symptom  Discussed fatty liver disease, findings, treatment recommendations.  Discussed recent labs including WBC elevation  She notes no better or worse.   husband hasn't seen witnessed apnea.  At this point she will monitor symptoms, diary symptoms, will work to exercise 30 minutes daily taking note of any symptoms.    She will return in 1 month for nurse visit for labs.      Considerations going forward, labs in 1 mo, possible sleep study, possible PFT, possible cardiology eval if symptoms persist.

## 2018-05-27 MED FILL — LEVOTHYROXINE 50 MCG TABLET: 50 | 30 days supply | Qty: 30 | Fill #1

## 2018-06-22 ENCOUNTER — Other Ambulatory Visit: Payer: Self-pay | Admitting: Medical

## 2018-06-22 NOTE — Telephone Encounter (Signed)
Is this ok to refill?  

## 2018-06-23 MED FILL — ALPRAZolam 0.5 MG TABS: 0.5 | 30 days supply | Qty: 30 | Fill #0

## 2018-07-14 ENCOUNTER — Other Ambulatory Visit: Payer: Self-pay

## 2018-07-14 DIAGNOSIS — F411 Generalized anxiety disorder: Secondary | ICD-10-CM

## 2018-07-14 DIAGNOSIS — K219 Gastro-esophageal reflux disease without esophagitis: Secondary | ICD-10-CM

## 2018-07-14 MED ORDER — OMEPRAZOLE 40 MG PO CPDR: 40.0000 mg | DELAYED_RELEASE_CAPSULE | Freq: Every day | ORAL | 1 refills | Status: DC

## 2018-07-14 MED ORDER — SERTRALINE HCL 50 MG PO TABS: 150.0000 mg | ORAL_TABLET | Freq: Every day | ORAL | 0 refills | Status: DC

## 2018-07-14 MED FILL — OMEPRAZOLE 40 MG CPDR: 40 | 90 days supply | Qty: 90 | Fill #0

## 2018-07-14 MED FILL — SERTRALINE HCL 50 MG TABLET: 50 | 90 days supply | Qty: 270 | Fill #0

## 2018-07-14 MED FILL — LEVOTHYROXINE 50 MCG TABLET: 50 | 30 days supply | Qty: 30 | Fill #2

## 2018-08-16 ENCOUNTER — Other Ambulatory Visit: Payer: Self-pay | Admitting: Medical

## 2018-08-16 MED FILL — LEVOTHYROXINE 50 MCG TABLET: 50 | 30 days supply | Qty: 30 | Fill #0

## 2018-09-20 MED FILL — LEVOTHYROXINE 50 MCG TABLET: 50 | 30 days supply | Qty: 30 | Fill #1

## 2018-10-25 ENCOUNTER — Other Ambulatory Visit: Payer: Self-pay | Admitting: Medical

## 2018-10-25 DIAGNOSIS — F411 Generalized anxiety disorder: Secondary | ICD-10-CM

## 2018-10-25 MED FILL — OMEPRAZOLE 40 MG CPDR: 40 | 90 days supply | Qty: 90 | Fill #1

## 2018-10-25 MED FILL — SERTRALINE HCL 50 MG TABLET: 50 | 90 days supply | Qty: 270 | Fill #0

## 2018-10-25 MED FILL — ALPRAZolam 0.5 MG TABS: 0.5 | 30 days supply | Qty: 30 | Fill #1

## 2018-10-25 MED FILL — LEVOTHYROXINE 50 MCG TABLET: 50 | 30 days supply | Qty: 30 | Fill #2

## 2018-10-25 NOTE — Telephone Encounter (Signed)
Is this ok to refill?  

## 2018-11-24 ENCOUNTER — Ambulatory Visit: Payer: 59 | Admitting: Medical

## 2018-11-24 ENCOUNTER — Encounter: Payer: Self-pay | Admitting: Medical

## 2018-11-24 ENCOUNTER — Other Ambulatory Visit: Payer: Self-pay

## 2018-11-24 VITALS — Temp 97.6°F | Ht 64.0 in | Wt 200.0 lb

## 2018-11-24 DIAGNOSIS — R350 Frequency of micturition: Secondary | ICD-10-CM | POA: Diagnosis not present

## 2018-11-24 DIAGNOSIS — R109 Unspecified abdominal pain: Secondary | ICD-10-CM

## 2018-11-24 DIAGNOSIS — R3915 Urgency of urination: Secondary | ICD-10-CM | POA: Diagnosis not present

## 2018-11-24 MED ORDER — NITROFURANTOIN MONOHYD MACRO 100 MG PO CAPS: 100.0000 mg | ORAL_CAPSULE | Freq: Two times a day (BID) | ORAL | 0 refills | Status: DC

## 2018-11-24 MED FILL — NITROFURANTOIN MONO-MCR 100: 100 | 7 days supply | Qty: 14 | Fill #0

## 2018-11-24 NOTE — Progress Notes (Signed)
Subjective:     Patient ID: Heather Maynard, female   DOB: Dec 07, 1980, 38 y.o.   MRN: [id number]  This visit type was conducted due to national recommendations for restrictions regarding the COVID-19 Pandemic (e.g. social distancing) in an effort to limit this patient's exposure and mitigate transmission in our community.  This format is felt to be most appropriate for this patient at this time.    Documentation for virtual audio and video telecommunications through Zoom encounter:  The patient was located at home. The provider was located in the office. The patient did consent to this visit and is aware of possible charges through their insurance for this visit.  The other persons participating in this telemedicine service were none. Time spent on call was 16 minutes and in review of previous records >20 minutes total.  This virtual service is not related to other E/M service within previous 7 days.   HPI Chief Complaint  Patient presents with  . flank pain    left flank pain/ribs X on and off for 2 weeks now is burning , stabbing pain since Yesterday    Virtual visit for flank pain.   Few weeks ago having some pains back left under ribs/flank area.   Pain was intermittent, but yesterday started back again, burning sensation.  Since yesterday pain has been constant.  Having some pressure in lower back.  Having some urinary urgency and frequency that started within past week.   No fever, no NVD, no constipation.   No blood in urine, no odor in urine.   No vaginal discharge.   No chills.  Had some achiness yesterday.   No rash or blisters on skin.   No sick contacts with similar sympotms.  No respiratory symptoms, no cough, no SOB, no wheezing, no congestion.   No trauma, no fall.   No recent strenuous exercise.   Has had a bad UTI in the past.   No prior hospitalization for urinary tract infection.  No hip pain, no paresthesia.    No hx/o kidney stone.  Pain is 3/10 resting,   No other  aggravating or relieving factors. No other complaint.    Review of Systems As in subjective    Objective:   Physical Exam  Temp 97.6 F (36.4 C) (Oral)   Ht 5\' 4"  (1.626 m)   Wt 200 lb (90.7 kg)   LMP 10/17/2018 (Approximate)   BMI 34.33 kg/m   Due to coronavirus pandemic stay at home measures, patient visit was virtual and they were not examined in person.   Gen: nad No pain with hip flexion or extension, no pain with squatting      Assessment:     Encounter Diagnoses  Name Primary?  . Flank pain Yes  . Urinary frequency   . Urinary urgency        Plan:      Flank pain, urinary frequency and urgency - discussed possible differential, UTI, renal stone, shingles, bulging disc, muscle spasm, but likely UTI based on symptoms.   Begin Macrobid, rest, hydrate well with water and some cranberry juice.   If symptoms change or worsen, call back.   otherwise f/u tomorrow.  She will come by tomorrow for urinalysis clean cath and brief exam by me for abdominal and back.  Heather Maynard was seen today for flank pain.  Diagnoses and all orders for this visit:  Flank pain  Urinary frequency  Urinary urgency  Other orders -  nitrofurantoin, macrocrystal-monohydrate, (MACROBID) 100 MG capsule; Take 1 capsule (100 mg total) by mouth 2 (two) times daily.

## 2018-11-25 ENCOUNTER — Other Ambulatory Visit: Payer: Self-pay

## 2018-11-25 ENCOUNTER — Telehealth: Payer: Self-pay | Admitting: Medical

## 2018-11-25 NOTE — Telephone Encounter (Signed)
Pt called and states that she is unable to come in for her lab visit today. She was to leave a urine. Due to work issues she can not come. Pt would like to know if she can finish medication and then advise if still having issues. Please advise pt at [phone].

## 2018-11-25 NOTE — Telephone Encounter (Signed)
That is fine, lets give this a try

## 2018-11-25 NOTE — Telephone Encounter (Signed)
Pt advised.

## 2018-12-08 ENCOUNTER — Other Ambulatory Visit: Payer: Self-pay | Admitting: Medical

## 2018-12-08 MED FILL — LEVOTHYROXINE 50 MCG TABLET: 50 | 30 days supply | Qty: 30 | Fill #0

## 2019-01-12 MED FILL — LEVOTHYROXINE 50 MCG TABLET: 50 | 30 days supply | Qty: 30 | Fill #1

## 2019-01-13 ENCOUNTER — Other Ambulatory Visit: Payer: Self-pay

## 2019-01-13 ENCOUNTER — Encounter: Payer: 59 | Admitting: Medical

## 2019-01-13 ENCOUNTER — Ambulatory Visit (INDEPENDENT_AMBULATORY_CARE_PROVIDER_SITE_OTHER): Payer: 59 | Admitting: Medical

## 2019-01-13 ENCOUNTER — Encounter: Payer: Self-pay | Admitting: Medical

## 2019-01-13 VITALS — BP 120/76 | HR 76 | Temp 97.8°F | Resp 16 | Ht 65.0 in | Wt 211.8 lb

## 2019-01-13 DIAGNOSIS — E039 Hypothyroidism, unspecified: Secondary | ICD-10-CM

## 2019-01-13 DIAGNOSIS — E669 Obesity, unspecified: Secondary | ICD-10-CM | POA: Diagnosis not present

## 2019-01-13 DIAGNOSIS — Z8249 Family history of ischemic heart disease and other diseases of the circulatory system: Secondary | ICD-10-CM | POA: Diagnosis not present

## 2019-01-13 DIAGNOSIS — Z131 Encounter for screening for diabetes mellitus: Secondary | ICD-10-CM

## 2019-01-13 DIAGNOSIS — Z9049 Acquired absence of other specified parts of digestive tract: Secondary | ICD-10-CM

## 2019-01-13 DIAGNOSIS — F411 Generalized anxiety disorder: Secondary | ICD-10-CM

## 2019-01-13 DIAGNOSIS — Z0000 Encounter for general adult medical examination without abnormal findings: Secondary | ICD-10-CM | POA: Diagnosis not present

## 2019-01-13 DIAGNOSIS — R635 Abnormal weight gain: Secondary | ICD-10-CM

## 2019-01-13 DIAGNOSIS — E559 Vitamin D deficiency, unspecified: Secondary | ICD-10-CM

## 2019-01-13 LAB — LIPID PANEL

## 2019-01-13 NOTE — Progress Notes (Signed)
Subjective:   HPI  Heather Maynard is a 38 y.o. female who presents for Chief Complaint  Patient presents with  . CPE    fasting CPE   has GYN    Patient Care Team: Heylee Tant, Kermit Balo, PA-C as PCP - General (Family Medicine) Sees dentist Sees eye doctor Sees gyn, [facility]  Concerns: concerned about obesity, gaining weight.     Diet: Breakfast - sometimes scrambled eggs, sometimes bowl of cereal Lunch - Shake for lunch  Dinner - variety of things, regular foods Snacks may include oranges, other fruit, applesauce  Exercise - 25 minute video workout  This year has had a lot of pollen and allergy problems.   Reviewed their medical, surgical, family, social, medication, and allergy history and updated chart as appropriate.  Past Medical History:  Diagnosis Date  . Acid indigestion    OTC as needed  . Anxiety    panic attacks  . Bimalleolar ankle fracture 08/24/2013   right  . Headache   . Wears contact lenses     Past Surgical History:  Procedure Laterality Date  . CHOLECYSTECTOMY N/A 12/19/2017   Procedure: LAPAROSCOPIC CHOLECYSTECTOMY;  Surgeon: Kinsinger, De Blanch, MD;  Location: [facility] OR;  Service: General;  Laterality: N/A;  . LAPAROSCOPIC TUBAL LIGATION Bilateral 08/01/2013   Procedure: LAPAROSCOPIC TUBAL LIGATION WITH FILSHIE CLIPS;  Surgeon: Meriel Pica, MD;  Location: [facility]S;  Service: Gynecology;  Laterality: Bilateral;  . ORIF ANKLE FRACTURE Right 08/29/2013   Procedure: OPEN REDUCTION INTERNAL FIXATION (ORIFRIGHT BIMALLEOLAR  ANKLE FRACTURE;  Surgeon: Sheral Apley, MD;  Location: [facility];  Service: Orthopedics;  Laterality: Right;    Social History   Socioeconomic History  . Marital status: Married    Spouse name: Not on file  . Number of children: Not on file  . Years of education: Not on file  . Highest education level: Not on file  Occupational History  . Not on file  Social Needs  . Financial resource  strain: Not on file  . Food insecurity:    Worry: Not on file    Inability: Not on file  . Transportation needs:    Medical: Not on file    Non-medical: Not on file  Tobacco Use  . Smoking status: Never Smoker  . Smokeless tobacco: Never Used  Substance and Sexual Activity  . Alcohol use: No  . Drug use: No  . Sexual activity: Yes  Lifestyle  . Physical activity:    Days per week: Not on file    Minutes per session: Not on file  . Stress: Not on file  Relationships  . Social connections:    Talks on phone: Not on file    Gets together: Not on file    Attends religious service: Not on file    Active member of club or organization: Not on file    Attends meetings of clubs or organizations: Not on file    Relationship status: Not on file  . Intimate partner violence:    Fear of current or ex partner: Not on file    Emotionally abused: Not on file    Physically abused: Not on file    Forced sexual activity: Not on file  Other Topics Concern  . 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 Hypertension Father   . Diabetes Father        insulin  . Heart disease Father        MI  . Anxiety disorder Sister   . Heart disease Maternal Grandmother   . Hypertension Maternal Grandmother   . Heart disease Paternal Grandmother   . Hypertension Paternal Grandmother   . Diabetes Paternal Grandfather        insulin  . Cancer Paternal Aunt        colon     Current Outpatient Medications:  .  ALPRAZolam (XANAX) 0.5 MG tablet, TAKE 1 TABLET (0.5 MG TOTAL) BY MOUTH AT BEDTIME AS NEEDED FOR ANXIETY., Disp: 30 tablet, Rfl: 1 .  levothyroxine (SYNTHROID) 50 MCG tablet, TAKE 1 TABLET BY MOUTH ONCE A DAY, Disp: 30 tablet, Rfl: 2 .  omeprazole (PRILOSEC) 40 MG capsule, Take 1 capsule (40 mg total) by  mouth daily., Disp: 90 capsule, Rfl: 1 .  sertraline (ZOLOFT) 50 MG tablet, TAKE 3 TABLETS BY MOUTH DAILY, Disp: 270 tablet, Rfl: 0 .  Ferrous Sulfate (IRON) 90 (18 Fe) MG TABS, Take by mouth., Disp: , Rfl:  .  progesterone (PROMETRIUM) 200 MG capsule, , Disp: , Rfl: 0  No Active Allergies   Review of Systems Constitutional: -fever, -chills, -sweats, -unexpected weight change, -decreased appetite, -fatigue Allergy: -sneezing, -itching, -congestion Dermatology: -changing moles, --rash, -lumps ENT: -runny nose, -ear pain, -sore throat, -hoarseness, -sinus pain, -teeth pain, - ringing in ears, -hearing loss, -nosebleeds Cardiology: -chest pain, -palpitations, -swelling, -difficulty breathing when lying flat, -waking up short of breath Respiratory: -cough, -shortness of breath, -difficulty breathing with exercise or exertion, -wheezing, -coughing up blood Gastroenterology: -abdominal pain, -nausea, -vomiting, -diarrhea, -constipation, -blood in stool, -changes in bowel movement, -difficulty swallowing or eating Hematology: -bleeding, -bruising  Musculoskeletal: -joint aches, -muscle aches, -joint swelling, -back pain, -neck pain, -cramping, -changes in gait Ophthalmology: denies vision changes, eye redness, itching, discharge Urology: -burning with urination, -difficulty urinating, -blood in urine, -urinary frequency, -urgency, -incontinence Neurology: -headache, -weakness, -tingling, -numbness, -memory loss, -falls, -dizziness Psychology: -depressed mood, -agitation, -sleep problems Breast/gyn: -breast tenderness, -discharge, -lumps, -vaginal discharge,- irregular periods, -heavy periods     Objective:  BP 120/76   Pulse 76   Temp 97.8 F (36.6 C) (Temporal)   Resp 16   Ht 5\' 5"  (1.651 m)   Wt 211 lb 12.8 oz (96.1 kg)   LMP 01/02/2019 (Approximate)   SpO2 98%   BMI 35.25 kg/m   General appearance: alert, no distress, WD/WN, white female Skin:scattered macules, no worrisome  lesions HEENT: normocephalic, conjunctiva/corneas normal, sclerae anicteric, PERRLA, EOMi, nares patent, no discharge or erythema, pharynx normal Oral cavity: MMM, tongue normal, teeth normal Neck: supple, no lymphadenopathy, no thyromegaly, no masses, normal ROM, no bruits Chest: non tender, normal shape and expansion Heart: RRR, normal S1, S2, no murmurs Lungs: CTA bilaterally, no wheezes, rhonchi, or rales Abdomen: +bs, soft, non tender, non distended, no masses, no hepatomegaly, no splenomegaly, no bruits Back: non tender, normal ROM, no scoliosis Musculoskeletal: upper extremities non tender, no obvious deformity, normal ROM throughout, lower extremities non tender, no obvious deformity, normal ROM throughout Extremities: no edema, no cyanosis, no clubbing Pulses: 2+ symmetric, upper and lower extremities, normal cap refill Neurological: alert, oriented x 3, CN2-12 intact, strength normal upper extremities and lower extremities, sensation normal throughout, DTRs 2+ throughout, no cerebellar signs, gait normal Psychiatric: normal affect, behavior normal, pleasant  Breast/gyn/rectal -  deferred to gynecology     Assessment and Plan :   Encounter Diagnoses  Name Primary?  . Encounter for health maintenance examination in adult Yes  . Family history of heart disease   . Hypothyroidism, unspecified type   . Vitamin D deficiency   . Weight gain   . S/P cholecystectomy   . Obesity with serious comorbidity, unspecified classification, unspecified obesity type   . Generalized anxiety disorder   . Screening for diabetes mellitus     Physical exam - discussed and counseled on healthy lifestyle, diet, exercise, preventative care, vaccinations, sick and well care, proper use of emergency dept and after hours care, and addressed their concerns.    Health screening: Advised they see their eye doctor yearly for routine vision care. Advised they see their dentist yearly for routine dental  care including hygiene visits twice yearly. See your gynecologist yearly for routine gynecological care.  Cancer screening Counseled on self breast exams, mammograms, cervical cancer screening  Vaccinations: Advised yearly influenza vaccine   Separate significant chronic issues discussed: Obesity - counseled on strategies to lose weight, goal setting, option for therapy including weight loss study, weight management clinic referral, medications, other  hypothyroidism - labs today, c/t same medication    Heather Maynard was seen today for cpe.  Diagnoses and all orders for this visit:  Encounter for health maintenance examination in adult -     Comprehensive metabolic panel -     CBC -     Hemoglobin A1c -     TSH -     Lipid panel  Family history of heart disease -     Lipid panel  Hypothyroidism, unspecified type -     TSH  Vitamin D deficiency  Weight gain  S/P cholecystectomy  Obesity with serious comorbidity, unspecified classification, unspecified obesity type -     Hemoglobin A1c -     TSH  Generalized anxiety disorder  Screening for diabetes mellitus -     Hemoglobin A1c    Follow-up pending labs, yearly for physical

## 2019-01-14 LAB — LIPID PANEL
Chol/HDL Ratio: 4.8 ratio — ABNORMAL HIGH (ref 0.0–4.4)
Cholesterol, Total: 224 mg/dL — ABNORMAL HIGH (ref 100–199)
HDL: 47 mg/dL (ref >39)
LDL Calculated: 148 mg/dL — ABNORMAL HIGH (ref 0–99)
Triglycerides: 143 mg/dL (ref 0–149)
VLDL Cholesterol Cal: 29 mg/dL (ref 5–40)

## 2019-01-14 LAB — CBC
Hematocrit: 37.4 % (ref 34.0–46.6)
Hemoglobin: 12 g/dL (ref 11.1–15.9)
MCH: 25.2 pg — ABNORMAL LOW (ref 26.6–33.0)
MCHC: 32.1 g/dL (ref 31.5–35.7)
MCV: 79 fL (ref 79–97)
Platelets: 378 10*3/uL (ref 150–450)
RBC: 4.76 x10E6/uL (ref 3.77–5.28)
RDW: 15 % (ref 11.7–15.4)
WBC: 8.9 10*3/uL (ref 3.4–10.8)

## 2019-01-14 LAB — COMPREHENSIVE METABOLIC PANEL
ALT: 21 IU/L (ref 0–32)
AST: 23 IU/L (ref 0–40)
Albumin/Globulin Ratio: 1.5 (ref 1.2–2.2)
Albumin: 4.7 g/dL (ref 3.8–4.8)
Alkaline Phosphatase: 89 IU/L (ref 39–117)
BUN/Creatinine Ratio: 12 (ref 9–23)
BUN: 8 mg/dL (ref 6–20)
Bilirubin Total: 0.3 mg/dL (ref 0.0–1.2)
CO2: 22 mmol/L (ref 20–29)
Calcium: 9.9 mg/dL (ref 8.7–10.2)
Chloride: 102 mmol/L (ref 96–106)
Creatinine, Ser: 0.69 mg/dL (ref 0.57–1.00)
GFR calc Af Amer: 129 mL/min/{1.73_m2} (ref >59)
GFR calc non Af Amer: 112 mL/min/{1.73_m2} (ref >59)
Globulin, Total: 3.1 g/dL (ref 1.5–4.5)
Glucose: 82 mg/dL (ref 65–99)
Potassium: 4.4 mmol/L (ref 3.5–5.2)
Sodium: 139 mmol/L (ref 134–144)
Total Protein: 7.8 g/dL (ref 6.0–8.5)

## 2019-01-14 LAB — TSH: TSH: 4.29 u[IU]/mL (ref 0.450–4.500)

## 2019-01-14 LAB — HEMOGLOBIN A1C
Est. average glucose Bld gHb Est-mCnc: 114 mg/dL
Hgb A1c MFr Bld: 5.6 % (ref 4.8–5.6)

## 2019-01-15 ENCOUNTER — Other Ambulatory Visit: Payer: Self-pay | Admitting: Medical

## 2019-01-15 DIAGNOSIS — K219 Gastro-esophageal reflux disease without esophagitis: Secondary | ICD-10-CM

## 2019-01-15 DIAGNOSIS — F411 Generalized anxiety disorder: Secondary | ICD-10-CM

## 2019-01-15 MED ORDER — OMEPRAZOLE 40 MG PO CPDR: 40.0000 mg | DELAYED_RELEASE_CAPSULE | Freq: Every day | ORAL | 3 refills | Status: DC

## 2019-01-15 MED ORDER — LEVOTHYROXINE SODIUM 50 MCG PO TABS: 50.0000 ug | ORAL_TABLET | Freq: Every day | ORAL | 3 refills | Status: DC

## 2019-01-15 MED ORDER — ALPRAZOLAM 0.5 MG PO TABS: 0.5000 mg | ORAL_TABLET | Freq: Every evening | ORAL | 3 refills | Status: DC | PRN

## 2019-01-16 MED FILL — OMEPRAZOLE 40 MG CPDR: 40 | 90 days supply | Qty: 90 | Fill #0

## 2019-01-16 MED FILL — ALPRAZolam 0.5 MG TABS: 0.5 | 30 days supply | Qty: 30 | Fill #0

## 2019-01-23 ENCOUNTER — Other Ambulatory Visit: Payer: Self-pay | Admitting: Medical

## 2019-01-23 DIAGNOSIS — F411 Generalized anxiety disorder: Secondary | ICD-10-CM

## 2019-01-24 MED FILL — SERTRALINE HCL 50 MG TABLET: 50 | 90 days supply | Qty: 270 | Fill #0

## 2019-02-15 MED FILL — LEVOTHYROXINE 50 MCG TABLET: 50 | 30 days supply | Qty: 30 | Fill #2

## 2019-03-17 MED FILL — LEVOTHYROXINE 50 MCG TABLET: 50 | 90 days supply | Qty: 90 | Fill #0

## 2019-04-28 ENCOUNTER — Other Ambulatory Visit: Payer: Self-pay | Admitting: Medical

## 2019-04-28 DIAGNOSIS — F411 Generalized anxiety disorder: Secondary | ICD-10-CM

## 2019-04-28 MED FILL — ALPRAZolam 0.5 MG TABS: 0.5 | 30 days supply | Qty: 30 | Fill #1

## 2019-04-28 MED FILL — OMEPRAZOLE 40 MG CPDR: 40 | 90 days supply | Qty: 90 | Fill #1

## 2019-04-28 NOTE — Telephone Encounter (Signed)
[facility] is requesting to fill pt zoloft please advise. kh

## 2019-05-02 MED FILL — SERTRALINE HCL 50 MG TABLET: 50 | 90 days supply | Qty: 270 | Fill #0

## 2019-05-16 ENCOUNTER — Other Ambulatory Visit: Payer: Self-pay

## 2019-05-16 ENCOUNTER — Ambulatory Visit: Payer: 59 | Admitting: Medical

## 2019-05-16 ENCOUNTER — Ambulatory Visit
Admission: RE | Admit: 2019-05-16 | Discharge: 2019-05-16 | Disposition: A | Discharge status: Home / Self Care | Payer: 59 | Source: Ambulatory Visit | Attending: Medical | Admitting: Medical

## 2019-05-16 ENCOUNTER — Encounter: Payer: Self-pay | Admitting: Medical

## 2019-05-16 VITALS — BP 130/88 | HR 79 | Temp 97.7°F | Ht 65.0 in | Wt 210.2 lb

## 2019-05-16 DIAGNOSIS — R109 Unspecified abdominal pain: Secondary | ICD-10-CM | POA: Diagnosis not present

## 2019-05-16 DIAGNOSIS — M549 Dorsalgia, unspecified: Secondary | ICD-10-CM

## 2019-05-16 DIAGNOSIS — R350 Frequency of micturition: Secondary | ICD-10-CM | POA: Diagnosis not present

## 2019-05-16 DIAGNOSIS — R1012 Left upper quadrant pain: Secondary | ICD-10-CM | POA: Diagnosis not present

## 2019-05-16 LAB — POCT URINALYSIS DIP (PROADVANTAGE DEVICE)
Bilirubin, UA: NEGATIVE
Blood, UA: NEGATIVE
Glucose, UA: NEGATIVE mg/dL
Ketones, POC UA: NEGATIVE mg/dL
Leukocytes, UA: NEGATIVE
Nitrite, UA: NEGATIVE
Protein Ur, POC: NEGATIVE mg/dL
Specific Gravity, Urine: 1.005
Urobilinogen, Ur: NEGATIVE
pH, UA: 6 (ref 5.0–8.0)

## 2019-05-16 MED ORDER — ONDANSETRON HCL 4 MG PO TABS: 4.0000 mg | ORAL_TABLET | Freq: Three times a day (TID) | ORAL | 0 refills | Status: DC | PRN

## 2019-05-16 MED ORDER — SULFAMETHOXAZOLE-TRIMETHOPRIM 800-160 MG PO TABS: 1.0000 | ORAL_TABLET | Freq: Two times a day (BID) | ORAL | 0 refills | Status: DC

## 2019-05-16 MED ORDER — HYDROCODONE-ACETAMINOPHEN 7.5-325 MG PO TABS: 1.0000 | ORAL_TABLET | Freq: Four times a day (QID) | ORAL | 0 refills | Status: AC | PRN

## 2019-05-16 NOTE — Progress Notes (Signed)
Subjective:  Heather Maynard is a 38 y.o. female who presents for Chief Complaint  Patient presents with  . Urinary Tract Infection    frequent urination-pressure and pain in left side      Here for 3-4 day hx/o pain and left upper abdomen, left upper back, burning pain at times, cannot get comfortable.  At times the pain is sharp.  She notes some urinary pressure, cramping pain, pain of the ribs, nausea.  She vomited once yesterday.  She has had some loose stool.  No constipation, no rash no history of shingles.  History of kidney stone.  No recent urinary tract infection.  No blood in the urine, no fever, no body aches or chills but just does not feel well in general.  No other aggravating or relieving factors.    No other c/o.  The following portions of the patient's history were reviewed and updated as appropriate: allergies, current medications, past family history, past medical history, past social history, past surgical history and problem list.  ROS Otherwise as in subjective above  Past Medical History:  Diagnosis Date  . Acid indigestion    OTC as needed  . Anxiety    panic attacks  . Bimalleolar ankle fracture 08/24/2013   right  . Headache   . Wears contact lenses    Current Outpatient Medications on File Prior to Visit  Medication Sig Dispense Refill  . ALPRAZolam (XANAX) 0.5 MG tablet Take 1 tablet (0.5 mg total) by mouth at bedtime as needed for anxiety. 30 tablet 3  . levothyroxine (SYNTHROID) 50 MCG tablet Take 1 tablet (50 mcg total) by mouth daily. 90 tablet 3  . omeprazole (PRILOSEC) 40 MG capsule Take 1 capsule (40 mg total) by mouth daily. 90 capsule 3  . sertraline (ZOLOFT) 50 MG tablet TAKE 3 TABLETS BY MOUTH DAILY 270 tablet 0  . progesterone (PROMETRIUM) 200 MG capsule   0   No current facility-administered medications on file prior to visit.      Objective: BP 130/88   Pulse 79   Temp 97.7 F (36.5 C)   Ht 5\' 5"  (1.651 m)   Wt 210 lb 3.2 oz  (95.3 kg)   SpO2 98%   BMI 34.98 kg/m   General appearance: alert, no distress, well developed, well nourished Heart: RRR, normal S1, S2, no murmurs Lungs: CTA bilaterally, no wheezes, rhonchi, or rales Abdomen: +bs, soft, generalized left-sided abdominal tenderness, otherwise non tender, non distended, no masses, no hepatomegaly, no splenomegaly Positive CVA tenderness on the left Pulses: 2+ radial pulses, 2+ pedal pulses, normal cap refill Ext: no edema   Assessment: Encounter Diagnoses  Name Primary?  . Frequent urination Yes  . Left flank pain   . Left upper quadrant abdominal pain   . Mid back pain      Plan: We discussed symptoms concerns, possible differential.  Urinalysis normal today.  I suspect renal stone.  We will go ahead and cover for urinary tract infection just in case.  I will send her for KUB x-ray, begin antibiotic, begin Zofran for nausea and vomiting.  Hydrate well, rest.  If not improving or worse the next few days then call or go to emergency department if much worse pain.  [name] was seen today for urinary tract infection.  Diagnoses and all orders for this visit:  Frequent urination -     POCT Urinalysis DIP (Proadvantage Device) -     DG Abd 1 View; Future -  Urine Culture  Left flank pain -     DG Abd 1 View; Future -     Urine Culture  Left upper quadrant abdominal pain -     DG Abd 1 View; Future -     Urine Culture  Mid back pain -     DG Abd 1 View; Future -     Urine Culture  Other orders -     HYDROcodone-acetaminophen (NORCO) 7.5-325 MG tablet; Take 1 tablet by mouth every 6 (six) hours as needed for up to 5 days for moderate pain. -     ondansetron (ZOFRAN) 4 MG tablet; Take 1 tablet (4 mg total) by mouth every 8 (eight) hours as needed for nausea or vomiting. -     sulfamethoxazole-trimethoprim (BACTRIM DS) 800-160 MG tablet; Take 1 tablet by mouth 2 (two) times daily.    Follow up: pending KUB xray

## 2019-05-17 LAB — URINE CULTURE: Organism ID, Bacteria: NO GROWTH

## 2019-05-23 ENCOUNTER — Telehealth: Payer: Self-pay

## 2019-05-23 NOTE — Telephone Encounter (Signed)
No urinary symptoms. Pressure in back. She had some constipation before but she was able to go yesterday, no trouble passing urine and no diarrhea.

## 2019-05-23 NOTE — Telephone Encounter (Signed)
Does she have any current urinary symptoms?  Does he have any diarrhea, any constipation, or if she having trouble passing urine or stool?  I may need to go ahead and get her back in and possibly do CT scan or other examination since she is worse  Work her in if needed with 1 of [place] today or tomorrow

## 2019-05-23 NOTE — Telephone Encounter (Signed)
Patient call and stated she was taking Bactrim for a UTI and she is feels nauseous and has been vomiting. Stated she can only keep sprite down. She has not taken the medicine since Sunday but still feel sick. Please advise.

## 2019-06-06 ENCOUNTER — Other Ambulatory Visit: Payer: Self-pay

## 2019-06-06 ENCOUNTER — Encounter: Payer: Self-pay | Admitting: Medical

## 2019-06-06 ENCOUNTER — Ambulatory Visit: Payer: 59 | Admitting: Medical

## 2019-06-06 VITALS — BP 120/84 | HR 92 | Temp 98.7°F | Ht 65.0 in | Wt 208.4 lb

## 2019-06-06 DIAGNOSIS — M549 Dorsalgia, unspecified: Secondary | ICD-10-CM

## 2019-06-06 DIAGNOSIS — R1012 Left upper quadrant pain: Secondary | ICD-10-CM

## 2019-06-06 MED ORDER — DEXILANT 60 MG PO CPDR: 60.0000 mg | DELAYED_RELEASE_CAPSULE | Freq: Every day | ORAL | 0 refills | Status: DC

## 2019-06-06 NOTE — Progress Notes (Signed)
Subjective:  Heather Maynard is a 38 y.o. female who presents for Chief Complaint  Patient presents with  . Back Pain    left side mid back and down left side sometimes     Here for recheck on pain.  I saw her back on October 6 for a variety of symptoms including left upper abdomen pain, left upper back pain, burning pain, not getting comfortable due to pain, urinary pressure, belly cramping, nausea, one episode of vomiting.  After last visit she had no improvement on Bactrim antibiotic or Norco hydrocodone pain medicine.  She has ongoing symptoms including a sensation of hot poker-like pain in the left mid back, some pain down the left side, but no paresthesias.  Has tried ice, heat, ibuprofen, lying down without, eating does not seem to have any change in symptoms.  Urinates normal amount of times per day, no other urinary changes, no bowel changes other than some mild loose stool from last visit.  LMP just finished 05/29/19  No dyspnea, No fever, No injury, no trauma, no change in activity.  No fall or trauma.  No other c/o.  The following portions of the patient's history were reviewed and updated as appropriate: allergies, current medications, past family history, past medical history, past social history, past surgical history and problem list.  ROS Otherwise as in subjective above  Past Medical History:  Diagnosis Date  . Acid indigestion    OTC as needed  . Anxiety    panic attacks  . Bimalleolar ankle fracture 08/24/2013   right  . Headache   . Wears contact lenses    Current Outpatient Medications on File Prior to Visit  Medication Sig Dispense Refill  . ALPRAZolam (XANAX) 0.5 MG tablet Take 1 tablet (0.5 mg total) by mouth at bedtime as needed for anxiety. 30 tablet 3  . levothyroxine (SYNTHROID) 50 MCG tablet Take 1 tablet (50 mcg total) by mouth daily. 90 tablet 3  . omeprazole (PRILOSEC) 40 MG capsule Take 1 capsule (40 mg total) by mouth daily. 90 capsule 3  .  ondansetron (ZOFRAN) 4 MG tablet Take 1 tablet (4 mg total) by mouth every 8 (eight) hours as needed for nausea or vomiting. 20 tablet 0  . sertraline (ZOLOFT) 50 MG tablet TAKE 3 TABLETS BY MOUTH DAILY 270 tablet 0  . progesterone (PROMETRIUM) 200 MG capsule   0  . sulfamethoxazole-trimethoprim (BACTRIM DS) 800-160 MG tablet Take 1 tablet by mouth 2 (two) times daily. (Patient not taking: Reported on 06/06/2019) 14 tablet 0   No current facility-administered medications on file prior to visit.      Objective: BP 120/84   Pulse 92   Temp 98.7 F (37.1 C)   Ht 5\' 5"  (1.651 m)   Wt 208 lb 6.4 oz (94.5 kg)   SpO2 98%   BMI 34.68 kg/m   General appearance: alert, no distress, well developed, well nourished Heart: RRR, normal S1, S2, no murmurs Lungs: CTA bilaterally, no wheezes, rhonchi, or rales Abdomen: +bs, soft, left upper abdominal tenderness, otherwise non tender, non distended, no masses, no hepatomegaly, no splenomegaly Mild left upper back tenderness along  CVA, pain noted at times when she was lying supine and turning or twisting against resistance, otherwise back nontender, and no deformity Pulses: 2+ radial pulses, 2+ pedal pulses, normal cap refill Ext: no edema Arms,legs neurovascularly intact    Assessment: Encounter Diagnoses  Name Primary?  . Left upper quadrant abdominal pain Yes  . Mid  back pain      Plan: We discussed symptoms concerns, exam findings.  Differential could include pancreatitis, ulcer, dyspepsia, musculoskeletal issue, bulging disc, kidney abnormality, other..  Discussed case with supervising physician Dr. Redmond School.  Begin trial of Dexilant as below.  Avoid ulcer and GERD triggers.  Rest, hydrate well.  Labs as below.  Consider imaging, consider GI consult.  Of note she had a CT abdomen pelvis May 2019 showing gallstones otherwise normal.  If not improving or worse the next few days then call or go to emergency department if much worse  pain.  [name] was seen today for back pain.  Diagnoses and all orders for this visit:  Left upper quadrant abdominal pain -     Comprehensive metabolic panel -     CBC -     Lipase -     Sedimentation rate  Mid back pain -     Comprehensive metabolic panel -     CBC -     Lipase -     Sedimentation rate  Other orders -     dexlansoprazole (DEXILANT) 60 MG capsule; Take 1 capsule (60 mg total) by mouth daily.    Follow up: pending labs

## 2019-06-07 ENCOUNTER — Telehealth: Payer: Self-pay

## 2019-06-07 ENCOUNTER — Other Ambulatory Visit: Payer: Self-pay

## 2019-06-07 ENCOUNTER — Emergency Department (HOSPITAL_COMMUNITY)
Admission: EM | Admit: 2019-06-07 | Discharge: 2019-06-08 | Disposition: A | Discharge status: Home / Self Care | Payer: 59 | Attending: Emergency Medicine | Admitting: Emergency Medicine

## 2019-06-07 ENCOUNTER — Encounter (HOSPITAL_COMMUNITY): Payer: Self-pay | Admitting: Emergency Medicine

## 2019-06-07 DIAGNOSIS — R1012 Left upper quadrant pain: Secondary | ICD-10-CM | POA: Insufficient documentation

## 2019-06-07 DIAGNOSIS — K760 Fatty (change of) liver, not elsewhere classified: Secondary | ICD-10-CM | POA: Diagnosis not present

## 2019-06-07 DIAGNOSIS — Z79899 Other long term (current) drug therapy: Secondary | ICD-10-CM | POA: Insufficient documentation

## 2019-06-07 DIAGNOSIS — R109 Unspecified abdominal pain: Secondary | ICD-10-CM | POA: Diagnosis not present

## 2019-06-07 LAB — COMPREHENSIVE METABOLIC PANEL
ALT: 20 IU/L (ref 0–32)
ALT: 22 U/L (ref 0–44)
AST: 21 IU/L (ref 0–40)
AST: 24 U/L (ref 15–41)
Albumin/Globulin Ratio: 1.4 (ref 1.2–2.2)
Albumin: 4.6 g/dL (ref 3.8–4.8)
Albumin: 4.4 g/dL (ref 3.5–5.0)
Alkaline Phosphatase: 93 IU/L (ref 39–117)
Alkaline Phosphatase: 83 U/L (ref 38–126)
Anion gap: 10 (ref 5–15)
BUN/Creatinine Ratio: 11 (ref 9–23)
BUN: 8 mg/dL (ref 6–20)
BUN: 5 mg/dL — ABNORMAL LOW (ref 6–20)
Bilirubin Total: 0.2 mg/dL (ref 0.0–1.2)
CO2: 25 mmol/L (ref 20–29)
CO2: 25 mmol/L (ref 22–32)
Calcium: 10.2 mg/dL (ref 8.7–10.2)
Calcium: 9.8 mg/dL (ref 8.9–10.3)
Chloride: 101 mmol/L (ref 96–106)
Chloride: 101 mmol/L (ref 98–111)
Creatinine, Ser: 0.7 mg/dL (ref 0.57–1.00)
Creatinine, Ser: 0.72 mg/dL (ref 0.44–1.00)
GFR calc Af Amer: 127 mL/min/{1.73_m2} (ref >59)
GFR calc Af Amer: >60 mL/min (ref >60)
GFR calc non Af Amer: 110 mL/min/{1.73_m2} (ref >59)
GFR calc non Af Amer: >60 mL/min (ref >60)
Globulin, Total: 3.3 g/dL (ref 1.5–4.5)
Glucose, Bld: 98 mg/dL (ref 70–99)
Glucose: 94 mg/dL (ref 65–99)
Potassium: 4.5 mmol/L (ref 3.5–5.2)
Potassium: 3.9 mmol/L (ref 3.5–5.1)
Sodium: 139 mmol/L (ref 134–144)
Sodium: 136 mmol/L (ref 135–145)
Total Bilirubin: 0.3 mg/dL (ref 0.3–1.2)
Total Protein: 7.9 g/dL (ref 6.0–8.5)
Total Protein: 8.1 g/dL (ref 6.5–8.1)

## 2019-06-07 LAB — CBC
HCT: 41.6 % (ref 36.0–46.0)
Hematocrit: 38.8 % (ref 34.0–46.6)
Hemoglobin: 12.3 g/dL (ref 11.1–15.9)
Hemoglobin: 12.8 g/dL (ref 12.0–15.0)
MCH: 25 pg — ABNORMAL LOW (ref 26.6–33.0)
MCH: 25.1 pg — ABNORMAL LOW (ref 26.0–34.0)
MCHC: 31.7 g/dL (ref 31.5–35.7)
MCHC: 30.8 g/dL (ref 30.0–36.0)
MCV: 79 fL (ref 79–97)
MCV: 81.7 fL (ref 80.0–100.0)
Platelets: 416 10*3/uL (ref 150–450)
Platelets: 451 10*3/uL — ABNORMAL HIGH (ref 150–400)
RBC: 4.92 x10E6/uL (ref 3.77–5.28)
RBC: 5.09 MIL/uL (ref 3.87–5.11)
RDW: 15.4 % (ref 11.7–15.4)
RDW: 15.1 % (ref 11.5–15.5)
WBC: 8.7 10*3/uL (ref 3.4–10.8)
WBC: 8.2 10*3/uL (ref 4.0–10.5)
nRBC: 0 % (ref 0.0–0.2)

## 2019-06-07 LAB — URINALYSIS, ROUTINE W REFLEX MICROSCOPIC
Bacteria, UA: NONE SEEN
Bilirubin Urine: NEGATIVE
Glucose, UA: NEGATIVE mg/dL
Hgb urine dipstick: NEGATIVE
Ketones, ur: NEGATIVE mg/dL
Leukocytes,Ua: NEGATIVE
Nitrite: NEGATIVE
Protein, ur: NEGATIVE mg/dL
Specific Gravity, Urine: 1.005 (ref 1.005–1.030)
pH: 6 (ref 5.0–8.0)

## 2019-06-07 LAB — I-STAT BETA HCG BLOOD, ED (MC, WL, AP ONLY): I-stat hCG, quantitative: <5 m[IU]/mL (ref <5)

## 2019-06-07 LAB — SEDIMENTATION RATE: Sed Rate: 82 mm/hr — ABNORMAL HIGH (ref 0–32)

## 2019-06-07 LAB — LIPASE: Lipase: 18 U/L (ref 14–72)

## 2019-06-07 NOTE — Telephone Encounter (Signed)
Trying get a hold of her again just to let her know the findings.  I assume she is having worse pain today which will probably warrant imaging.  So I assume if she is at the emergency department that will get done here shortly  Particular she had the marker for inflammation that was elevated but the rest of the labs looked okay

## 2019-06-07 NOTE — ED Triage Notes (Signed)
Pt here with left flank pain for the last several weeks. Reports severe pain last night around 8pm. VSS.

## 2019-06-07 NOTE — Telephone Encounter (Signed)
Called pt and gave lab results, she is currently at the hospital still due to pain.

## 2019-06-07 NOTE — Telephone Encounter (Signed)
Patient called and left a message about still having pain. It appears she is at the hospital currently. Her labs that were done is back. Please notate on them so that I can call with results. However she has also had labs done again today.

## 2019-06-08 ENCOUNTER — Emergency Department (HOSPITAL_COMMUNITY): Payer: 59

## 2019-06-08 ENCOUNTER — Encounter: Payer: Self-pay | Admitting: Physician Assistant

## 2019-06-08 DIAGNOSIS — K760 Fatty (change of) liver, not elsewhere classified: Secondary | ICD-10-CM | POA: Diagnosis not present

## 2019-06-08 DIAGNOSIS — Z79899 Other long term (current) drug therapy: Secondary | ICD-10-CM | POA: Diagnosis not present

## 2019-06-08 DIAGNOSIS — R1012 Left upper quadrant pain: Secondary | ICD-10-CM | POA: Diagnosis not present

## 2019-06-08 MED ORDER — SUCRALFATE 1 G PO TABS: 1.0000 g | ORAL_TABLET | Freq: Three times a day (TID) | ORAL | 0 refills | Status: DC

## 2019-06-08 MED ORDER — SUCRALFATE 1 G PO TABS
1.0000 g | ORAL_TABLET | Freq: Once | ORAL | Status: AC
  Administered 2019-06-08: 1 g via ORAL
  Filled 2019-06-08: qty 1

## 2019-06-08 MED ORDER — ALUM & MAG HYDROXIDE-SIMETH 200-200-20 MG/5ML PO SUSP
30.0000 mL | Freq: Once | ORAL | Status: AC
  Administered 2019-06-08: 30 mL via ORAL
  Filled 2019-06-08: qty 30

## 2019-06-08 MED ORDER — LIDOCAINE VISCOUS HCL 2 % MT SOLN
15.0000 mL | Freq: Once | OROMUCOSAL | Status: AC
  Administered 2019-06-08: 02:00:00 15 mL via ORAL
  Filled 2019-06-08: qty 15

## 2019-06-08 MED FILL — SUCRALFATE 1 GM TABLET: 1 | 30 days supply | Qty: 120 | Fill #0

## 2019-06-08 NOTE — ED Notes (Signed)
Patient verbalizes understanding of discharge instructions. Opportunity for questioning and answers were provided. Armband removed by staff, pt discharged from ED ambulatory.   

## 2019-06-08 NOTE — Discharge Instructions (Signed)
Your work-up in the emergency department was reassuring.  Your blood work was normal and there is no evidence of urinary tract infection, kidney stone, cyst on your kidney.  While your pain may be muscular, you would benefit from follow-up with a gastroenterologist to exclude gastritis.  Continue Dexilant as prescribed.  You have been given a prescription for Carafate to take 4 times a day.  Continue follow-up with your primary care doctor in the interim.  You may return for new or concerning symptoms.

## 2019-06-08 NOTE — ED Provider Notes (Addendum)
[facility] Provider Note   CSN: [id number] Arrival date & time: 06/07/19  1406     History   Chief Complaint Chief Complaint  Patient presents with  . Flank Pain    HPI Heather Maynard is a 38 y.o. female.     38 year old female presents to the emergency department for evaluation of pain to her left flank.  Pain has been constant and worsening over the past 2 weeks.  Notes onset of worsening 1 to 2 days ago.  She describes the pain in her left flank as sharp, burning.  It radiates towards the front of her abdomen.  Is not able to specify any specific aggravating factors, but denies worsening with movement, ambulation, breathing.  She has not had any associated fevers, shortness of breath, vomiting, bowel changes, urinary symptoms, extremity numbness or paresthesias, bowel or bladder incontinence.  Denies history of trauma or injury to her back.  Has seen her primary care doctor in 2 occasions for this.  Was initially started on hydrocodone and ibuprofen without relief.  Was later advised by her PCP to discontinue ibuprofen over concern for possible reflux/gastritis.  Most recently prescribed Dexilant which she has taken for 48 hours without improvement.  Came to the ED for evaluation given increased pain severity.   Flank Pain    Past Medical History:  Diagnosis Date  . Acid indigestion    OTC as needed  . Anxiety    panic attacks  . Bimalleolar ankle fracture 08/24/2013   right  . Headache   . Wears contact lenses     Patient Active Problem List   Diagnosis Date Noted  . Flank pain 11/24/2018  . Urinary frequency 11/24/2018  . Urinary urgency 11/24/2018  . SOB (shortness of breath) 05/18/2018  . Iron deficiency anemia 05/18/2018  . Chest pain 05/18/2018  . Snoring 05/18/2018  . Daytime somnolence 05/18/2018  . Hypothyroidism 02/17/2018  . Fatigue 02/17/2018  . Family history of heart disease 02/17/2018  . S/P  cholecystectomy 02/17/2018  . Abnormal EKG 02/17/2018  . Weight gain 02/17/2018  . Swelling of both hands 02/17/2018  . Acute calculous cholecystitis 12/19/2017  . Screening for diabetes mellitus 12/31/2015  . Generalized anxiety disorder 12/31/2015  . History of fracture 12/31/2015  . Vitamin D deficiency 12/31/2015  . Gastroesophageal reflux disease without esophagitis 12/31/2015  . Obesity 12/31/2015    Past Surgical History:  Procedure Laterality Date  . CHOLECYSTECTOMY N/A 12/19/2017   Procedure: LAPAROSCOPIC CHOLECYSTECTOMY;  Surgeon: Kinsinger, De BlanchLuke Aaron, MD;  [facility];  Service: General;  Laterality: N/A;  . LAPAROSCOPIC TUBAL LIGATION Bilateral 08/01/2013   Procedure: LAPAROSCOPIC TUBAL LIGATION WITH FILSHIE CLIPS;  Surgeon: Meriel Picaichard M Holland, MD;  Location: [facility] ORS;  Service: Gynecology;  Laterality: Bilateral;  . ORIF ANKLE FRACTURE Right 08/29/2013   Procedure: OPEN REDUCTION INTERNAL FIXATION (ORIFRIGHT BIMALLEOLAR  ANKLE FRACTURE;  Surgeon: Sheral Apleyimothy D Murphy, MD;  Location: [facility];  Service: Orthopedics;  Laterality: Right;     OB History    Gravida  3   Para  3   Term  3   Preterm      AB      Living  3     SAB      TAB      Ectopic      Multiple      Live Births  3            Home Medications  Prior to Admission medications   Medication Sig Start Date End Date Taking? Authorizing Provider  ALPRAZolam Duanne Moron) 0.5 MG tablet Take 1 tablet (0.5 mg total) by mouth at bedtime as needed for anxiety. 01/15/19   Tysinger, Camelia Eng, PA-C  dexlansoprazole (DEXILANT) 60 MG capsule Take 1 capsule (60 mg total) by mouth daily. 06/06/19   Tysinger, Camelia Eng, PA-C  levothyroxine (SYNTHROID) 50 MCG tablet Take 1 tablet (50 mcg total) by mouth daily. 01/15/19   Tysinger, Camelia Eng, PA-C  omeprazole (PRILOSEC) 40 MG capsule Take 1 capsule (40 mg total) by mouth daily. 01/15/19   Tysinger, Camelia Eng, PA-C  ondansetron (ZOFRAN) 4 MG tablet Take 1  tablet (4 mg total) by mouth every 8 (eight) hours as needed for nausea or vomiting. 05/16/19   Tysinger, Camelia Eng, PA-C  progesterone (PROMETRIUM) 200 MG capsule  02/08/18   [provider]  sertraline (ZOLOFT) 50 MG tablet TAKE 3 TABLETS BY MOUTH DAILY 05/01/19   Tysinger, Camelia Eng, PA-C  sucralfate (CARAFATE) 1 g tablet Take 1 tablet (1 g total) by mouth 4 (four) times daily -  with meals and at bedtime. 06/08/19   Antonietta Breach, PA-C  sulfamethoxazole-trimethoprim (BACTRIM DS) 800-160 MG tablet Take 1 tablet by mouth 2 (two) times daily. Patient not taking: Reported on 06/06/2019 05/16/19   Tysinger, Camelia Eng, PA-C    Family History Family History  Problem Relation Age of Onset  . Hypertension Father   . Diabetes Father        insulin  . Heart disease Father        MI  . Anxiety disorder Sister   . Heart disease Maternal Grandmother   . Hypertension Maternal Grandmother   . Heart disease Paternal Grandmother   . Hypertension Paternal Grandmother   . Diabetes Paternal Grandfather        insulin  . Cancer Paternal Aunt        colon    Social History Social History   Tobacco Use  . Smoking status: Never Smoker  . Smokeless tobacco: Never Used  Substance Use Topics  . Alcohol use: No  . Drug use: No     Allergies   Patient has no known allergies.   Review of Systems Review of Systems  Genitourinary: Positive for flank pain.  Ten systems reviewed and are negative for acute change, except as noted in the HPI.     Physical Exam Updated Vital Signs BP 115/75   Pulse 77   Temp 98.6 F (37 C) (Oral)   Resp 20   Ht 5\' 4"  (1.626 m)   Wt 92.5 kg   LMP 05/29/2019   SpO2 100%   BMI 35.02 kg/m   Physical Exam Vitals signs and nursing note reviewed.  Constitutional:      General: She is not in acute distress.    Appearance: She is well-developed. She is not diaphoretic.     Comments: Nontoxic appearing, pleasant.  HENT:     Head: Normocephalic and atraumatic.   Eyes:     General: No scleral icterus.    Conjunctiva/sclera: Conjunctivae normal.  Neck:     Musculoskeletal: Normal range of motion.  Cardiovascular:     Rate and Rhythm: Normal rate and regular rhythm.     Pulses: Normal pulses.  Pulmonary:     Effort: Pulmonary effort is normal. No respiratory distress.     Comments: Respirations even and unlabored Abdominal:     Comments: Abdomen soft, nondistended.  There  is mild tenderness in the left upper quadrant without guarding.  No palpable masses.  Minimal tenderness to the left mid back.  Musculoskeletal: Normal range of motion.     Comments: No tenderness to palpation of the thoracic or lumbosacral midline.  No bony deformities, step-offs, crepitus.  Skin:    General: Skin is warm and dry.     Coloration: Skin is not pale.     Findings: No erythema or rash.  Neurological:     Mental Status: She is alert and oriented to person, place, and time.     Comments: Moving all extremities spontaneously.  Psychiatric:        Behavior: Behavior normal.      ED Treatments / Results  Labs (all labs ordered are listed, but only abnormal results are displayed) Labs Reviewed  CBC - Abnormal; Notable for the following components:      Result Value   MCH 25.1 (*)    Platelets 451 (*)    All other components within normal limits  COMPREHENSIVE METABOLIC PANEL - Abnormal; Notable for the following components:   BUN 5 (*)    All other components within normal limits  URINALYSIS, ROUTINE W REFLEX MICROSCOPIC - Abnormal; Notable for the following components:   APPearance CLOUDY (*)    All other components within normal limits  I-STAT BETA HCG BLOOD, ED ([facility], [facility], [facility] ONLY)    EKG None  Radiology US Renal  Result Date: 06/08/2019 CLINICAL DATA:  38 year old female with left flank pain. EXAM: RENAL / URINARY TRACT ULTRASOUND COMPLETE COMPARISON:  Right upper quadrant ultrasound dated 12/27/2017 and CT of the abdomen pelvis dated 12/19/2017  FINDINGS: Right Kidney: Renal measurements: 12.0 x 3.7 x 4.5 cm = volume: 104 mL. Normal echogenicity. No hydronephrosis or shadowing stone. Left Kidney: Renal measurements: 12.0 x 4.4 x 5.6 cm = volume: 125 mL. Normal echogenicity. No hydronephrosis or shadowing stone. Bladder: Appears normal for degree of bladder distention. Other: There is diffuse increase in liver echogenicity most commonly seen in the setting of fatty infiltration. Superimposed inflammation or fibrosis is not excluded. Clinical correlation is recommended. IMPRESSION: 1. Unremarkable kidneys and urinary bladder. 2. Fatty liver. Electronically Signed   By: Elgie Collard M.D.   On: 06/08/2019 01:36    Procedures Procedures (including critical care time)  Medications Ordered in ED Medications  alum & mag hydroxide-simeth (MAALOX/MYLANTA) 200-200-20 MG/5ML suspension 30 mL (30 mLs Oral Given 06/08/19 0132)    And  lidocaine (XYLOCAINE) 2 % viscous mouth solution 15 mL (15 mLs Oral Given 06/08/19 0131)  sucralfate (CARAFATE) tablet 1 g (1 g Oral Given 06/08/19 0131)     Initial Impression / Assessment and Plan / ED Course  I have reviewed the triage vital signs and the nursing notes.  Pertinent labs & imaging results that were available during my care of the patient were reviewed by me and considered in my medical decision making (see chart for details).        38 year old female presents to the emergency department for evaluation of left flank pain.  Pain has been present for weeks.  It has been managed by her primary care doctor as an outpatient with 2 previous appointments.  The patient is nontoxic-appearing and afebrile.  Vitals have improved over ED course.  Laboratory work-up initiated in triage which is without leukocytosis, electrolyte derangements.  Liver and kidney function preserved.  Urinalysis without evidence of urinary tract infection.  Pregnancy is negative.  Pain is reproducible on  palpation to the left  flank as well as the left upper quadrant.  She was previously started on Dexilant.  Question GI etiology vs MSK.  She underwent renal ultrasound which shows no cyst, hydronephrosis, or other findings consistent with obstructive uropathy.  Pain is not pleuritic and patient is PERC negative.  Doubt PE.  Given symptom chronicity, feel she is appropriate for continued follow-up with her primary care doctor.  Will refer to GI.  Carafate added to current medications.  Return precautions discussed and provided. Patient discharged in stable condition with no unaddressed concerns.   Final Clinical Impressions(s) / ED Diagnoses   Final diagnoses:  Left flank pain    ED Discharge Orders         Ordered    sucralfate (CARAFATE) 1 g tablet  3 times daily with meals & bedtime     06/08/19 0153           Antony Madura, PA-C 06/08/19 0554    Antony Madura, PA-C 06/08/19 0554    Mesner, Barbara Cower, MD 06/08/19 [phone]

## 2019-06-09 ENCOUNTER — Telehealth: Payer: Self-pay

## 2019-06-09 NOTE — Telephone Encounter (Signed)
Patient asked me to cancel message and that she will send one via mychart

## 2019-06-12 ENCOUNTER — Other Ambulatory Visit: Payer: Self-pay

## 2019-06-12 ENCOUNTER — Ambulatory Visit: Payer: 59 | Admitting: Medical

## 2019-06-12 ENCOUNTER — Encounter: Payer: Self-pay | Admitting: Medical

## 2019-06-12 VITALS — Ht 65.0 in | Wt 208.0 lb

## 2019-06-12 DIAGNOSIS — M546 Pain in thoracic spine: Secondary | ICD-10-CM

## 2019-06-12 DIAGNOSIS — M62838 Other muscle spasm: Secondary | ICD-10-CM

## 2019-06-12 MED ORDER — CYCLOBENZAPRINE HCL 10 MG PO TABS: ORAL_TABLET | ORAL | 0 refills | Status: DC

## 2019-06-12 MED FILL — CYCLOBENZAPRINE HCL 10 MG T: 10 | 6 days supply | Qty: 12 | Fill #0

## 2019-06-12 NOTE — Progress Notes (Signed)
Subjective:  Heather Maynard is a 38 y.o. female who presents for Chief Complaint  Patient presents with  . Back Pain     This visit type was conducted due to national recommendations for restrictions regarding the COVID-19 Pandemic (e.g. social distancing) in an effort to limit this patient's exposure and mitigate transmission in our community.  Due to their co-morbid illnesses, this patient is at least at moderate risk for complications without adequate follow up.  This format is felt to be most appropriate for this patient at this time.    Documentation for virtual audio and video telecommunications through Zoom encounter:  The patient was located at home. The provider was located in the office. The patient did consent to this visit and is aware of possible charges through their insurance for this visit.  The other persons participating in this telemedicine service were none. Time spent on call was 10 minutes and in review of previous records >10 minutes total.  This virtual service is not related to other E/M service within previous 7 days.  Virtual consult for recheck on back pain.  I saw her last week for the same.  She ended up going to the emergency department the next day after I saw her, had a renal ultrasound, had some other test done.  They could not find any specific issue either.  they did refer her to gastroenterology for an appointment on November 12.  They continued her PPI and started her on sucralfate.  She does not noticed any major improvement on these medications  However 3 days ago after the emergency department appointment she had worsening of the left back pain, was tense, and had spasms in her back muscles,, hurts to move or get out of the bed, is still stiff and is sore to move her back.  No rash no fever no change in bowel or bladder.  She is requesting a muscle relaxer.   From prior visits for the same, she has had ongoing left upper abdomen pain, left upper  back pain, burning pain, not getting comfortable due to pain, urinary pressure, belly cramping, nausea, one episode of vomiting.  After last visit she had no improvement on Bactrim antibiotic or Norco hydrocodone pain medicine.  She has ongoing symptoms including a sensation of hot poker-like pain in the left mid back, some pain down the left side, but no paresthesias.  Has tried ice, heat, ibuprofen, lying down without, eating does not seem to have any change in symptoms.  Urinates normal amount of times per day, no other urinary changes, no bowel changes other than some mild loose stool from last visit.  LMP just finished 05/29/19  No dyspnea, No fever, No injury, no trauma, no change in activity.  No fall or trauma.  No other c/o.  The following portions of the patient's history were reviewed and updated as appropriate: allergies, current medications, past family history, past medical history, past social history, past surgical history and problem list.  ROS Otherwise as in subjective above  Past Medical History:  Diagnosis Date  . Acid indigestion    OTC as needed  . Anxiety    panic attacks  . Bimalleolar ankle fracture 08/24/2013   right  . Headache   . Wears contact lenses    Current Outpatient Medications on File Prior to Visit  Medication Sig Dispense Refill  . ALPRAZolam (XANAX) 0.5 MG tablet Take 1 tablet (0.5 mg total) by mouth at bedtime as needed for anxiety.  30 tablet 3  . dexlansoprazole (DEXILANT) 60 MG capsule Take 1 capsule (60 mg total) by mouth daily. 15 capsule 0  . levothyroxine (SYNTHROID) 50 MCG tablet Take 1 tablet (50 mcg total) by mouth daily. 90 tablet 3  . omeprazole (PRILOSEC) 40 MG capsule Take 1 capsule (40 mg total) by mouth daily. 90 capsule 3  . ondansetron (ZOFRAN) 4 MG tablet Take 1 tablet (4 mg total) by mouth every 8 (eight) hours as needed for nausea or vomiting. 20 tablet 0  . progesterone (PROMETRIUM) 200 MG capsule   0  . sertraline  (ZOLOFT) 50 MG tablet TAKE 3 TABLETS BY MOUTH DAILY 270 tablet 0  . sucralfate (CARAFATE) 1 g tablet Take 1 tablet (1 g total) by mouth 4 (four) times daily -  with meals and at bedtime. 120 tablet 0  . sulfamethoxazole-trimethoprim (BACTRIM DS) 800-160 MG tablet Take 1 tablet by mouth 2 (two) times daily. (Patient not taking: Reported on 06/12/2019) 14 tablet 0   No current facility-administered medications on file prior to visit.      Objective: Ht 5\' 5"  (1.651 m)   Wt 208 lb (94.3 kg)   LMP 05/29/2019   BMI 34.61 kg/m   Not examined in person as this is a virtual consult    Assessment: Encounter Diagnoses  Name Primary?  . Left-sided thoracic back pain, unspecified chronicity Yes  . Muscle spasm      Plan: Begin medication below, advised stretching, heat, massage.  Her symptoms now suggest more of a musculoskeletal etiology versus bulging disc in the thoracic spine.  Given the timeframe of ongoing symptoms this makes more sense than some of the other differential we had discussed last visit.  Continue Dexilant for now, continue to keep appointment for GI consult.   Call report 3-4 days.    Cedrica was seen today for back pain.  Diagnoses and all orders for this visit:  Left-sided thoracic back pain, unspecified chronicity  Muscle spasm  Other orders -     cyclobenzaprine (FLEXERIL) 10 MG tablet; 1/2-1 tablet at bedtime or up to BID for spasm    Follow up: pending labs

## 2019-06-12 NOTE — Telephone Encounter (Signed)
Patient has been scheduled for a virtual appointment. ?

## 2019-06-22 ENCOUNTER — Ambulatory Visit: Payer: 59 | Admitting: Physician Assistant

## 2019-06-22 ENCOUNTER — Encounter: Payer: Self-pay | Admitting: Physician Assistant

## 2019-06-22 VITALS — BP 122/78 | HR 103 | Temp 98.1°F | Ht 64.0 in | Wt 201.0 lb

## 2019-06-22 DIAGNOSIS — M545 Low back pain, unspecified: Secondary | ICD-10-CM

## 2019-06-22 DIAGNOSIS — R1012 Left upper quadrant pain: Secondary | ICD-10-CM

## 2019-06-22 MED FILL — LEVOTHYROXINE 50 MCG TABLET: 50 | 90 days supply | Qty: 90 | Fill #1

## 2019-06-22 MED FILL — ALPRAZolam 0.5 MG TABS: 0.5 | 30 days supply | Qty: 30 | Fill #2

## 2019-06-22 NOTE — Patient Instructions (Signed)
You have been scheduled for a CT scan of the abdomen and pelvis/CT thoracic spine at [facility] ([address]---this is in the same building as [employer]).   You are scheduled on 07/05/19 at 130pm. You should arrive 15 minutes prior to your appointment time for registration. Please follow the written instructions below on the day of your exam:  WARNING: IF YOU ARE ALLERGIC TO IODINE/X-RAY DYE, PLEASE NOTIFY RADIOLOGY IMMEDIATELY AT [phone]! YOU WILL BE GIVEN A 13 HOUR PREMEDICATION PREP.  1) Do not eat or drink anything after 930am (4 hours prior to your test) 2) You have been given 2 bottles of oral contrast to drink. The solution may taste better if refrigerated, but do NOT add ice or any other liquid to this solution. Shake well before drinking.    Drink 1 bottle of contrast @ 1130am (2 hours prior to your exam)  Drink 1 bottle of contrast @ 1230pm (1 hour prior to your exam)  You may take any medications as prescribed with a small amount of water, if necessary. If you take any of the following medications: METFORMIN, GLUCOPHAGE, GLUCOVANCE, AVANDAMET, RIOMET, FORTAMET, [place] MET, JANUMET, GLUMETZA or METAGLIP, you MAY be asked to HOLD this medication 48 hours AFTER the exam.  The purpose of you drinking the oral contrast is to aid in the visualization of your intestinal tract. The contrast solution may cause some diarrhea. Depending on your individual set of symptoms, you may also receive an intravenous injection of x-ray contrast/dye. Plan on being at [facility] for 30 minutes or longer, depending on the type of exam you are having performed.  This test typically takes 30-45 minutes to complete.  If you have any questions regarding your exam or if you need to reschedule, you may call the CT department at [phone] between the hours of 8:00 am and 5:00 pm, Monday-Friday.  Continue Omeprazole Finish Carafate No Ibuprofen   It was a pleasure  to see you today!  Amy Esterwood PA   ________________________________________________________________________

## 2019-06-22 NOTE — Progress Notes (Signed)
Subjective:    Patient ID: Heather Maynard, female    DOB: 10-17-1980, 38 y.o.   MRN: [id number]  HPI Heather Maynard is a pleasant 38 year old white female, new to GI today, referred by Heather Bode, PA-C/primary care for evaluation of left upper abdominal pain. Patient has not had any prior GI evaluation.  She is status post cholecystectomy in 2019 and has had bilateral tubal ligation.  Also with history of GERD and anxiety. She says that her current symptoms started earlier this year with intermittent left mid back pain that would come and go.  She says in October she started having fairly constant burning pain in the mid back that was initially nonradiating and then eventually started coming around into the left side.  Over the past few weeks she has been noticing more discomfort in the left upper abdomen.  She says this pain is there most days and describes it more as a dull ache and at times crampy sensation.  Her stools have been a bit looser but just having 1 bowel movement per day.  She has not noted any melena or hematochezia.  She has had some bloating.  No nausea or vomiting, no fever or chills and no weight loss. She had been taking a couple of ibuprofen per day for her back and stopped a couple of weeks ago.  She is on omeprazole chronically for GERD.  She had been given a prescription for Carafate recently and also took a short course of Dexilant samples. She went to the emergency room on 06/07/2019 because of the increase in the left sided pain which was radiating around into the abdomen.  She had a KUB done which was negative and a renal ultrasound which showed no evidence of ureteral lithiasis and no hydronephrosis.  Interestingly her sed rate was elevated at 82. CBC within normal limits hemoglobin 12.3 c-Met unremarkable UA and beta hCG also negative. Family history is pertinent for colon cancer in an aunt in her late 63s and another paternal aunt with colon polyps. Patient has not had  any known injury to her back.  Review of Systems Pertinent positive and negative review of systems were noted in the above HPI section.  All other review of systems was otherwise negative.  Outpatient Encounter Medications as of 06/22/2019  Medication Sig  . ALPRAZolam (XANAX) 0.5 MG tablet Take 1 tablet (0.5 mg total) by mouth at bedtime as needed for anxiety.  . cyclobenzaprine (FLEXERIL) 10 MG tablet 1/2-1 tablet at bedtime or up to BID for spasm  . dexlansoprazole (DEXILANT) 60 MG capsule Take 1 capsule (60 mg total) by mouth daily.  Marland Kitchen levothyroxine (SYNTHROID) 50 MCG tablet Take 1 tablet (50 mcg total) by mouth daily.  Marland Kitchen omeprazole (PRILOSEC) 40 MG capsule Take 1 capsule (40 mg total) by mouth daily.  . ondansetron (ZOFRAN) 4 MG tablet Take 1 tablet (4 mg total) by mouth every 8 (eight) hours as needed for nausea or vomiting.  . sertraline (ZOLOFT) 50 MG tablet TAKE 3 TABLETS BY MOUTH DAILY  . sucralfate (CARAFATE) 1 g tablet Take 1 tablet (1 g total) by mouth 4 (four) times daily -  with meals and at bedtime.  . [DISCONTINUED] progesterone (PROMETRIUM) 200 MG capsule   . [DISCONTINUED] sulfamethoxazole-trimethoprim (BACTRIM DS) 800-160 MG tablet Take 1 tablet by mouth 2 (two) times daily. (Patient not taking: Reported on 06/12/2019)   No facility-administered encounter medications on file as of 06/22/2019.    No Known Allergies Patient Active  Problem List   Diagnosis Date Noted  . Left-sided thoracic back pain 06/12/2019  . Muscle spasm 06/12/2019  . Flank pain 11/24/2018  . Urinary frequency 11/24/2018  . Urinary urgency 11/24/2018  . SOB (shortness of breath) 05/18/2018  . Iron deficiency anemia 05/18/2018  . Chest pain 05/18/2018  . Snoring 05/18/2018  . Daytime somnolence 05/18/2018  . Hypothyroidism 02/17/2018  . Fatigue 02/17/2018  . Family history of heart disease 02/17/2018  . S/P cholecystectomy 02/17/2018  . Abnormal EKG 02/17/2018  . Weight gain 02/17/2018  .  Swelling of both hands 02/17/2018  . Acute calculous cholecystitis 12/19/2017  . Screening for diabetes mellitus 12/31/2015  . Generalized anxiety disorder 12/31/2015  . History of fracture 12/31/2015  . Vitamin D deficiency 12/31/2015  . Gastroesophageal reflux disease without esophagitis 12/31/2015  . Obesity 12/31/2015   Social History   Socioeconomic History  . Marital status: Married    Spouse name: Not on file  . Number of children: Not on file  . Years of education: Not on file  . Highest education level: Not on file  Occupational History  . Not on file  Social Needs  . Financial resource strain: Not on file  . Food insecurity    Worry: Not on file    Inability: Not on file  . Transportation needs    Medical: Not on file    Non-medical: Not on file  Tobacco Use  . Smoking status: Never Smoker  . Smokeless tobacco: Never Used  Substance and Sexual Activity  . Alcohol use: No  . Drug use: No  . Sexual activity: Yes    Birth control/protection: Surgical  Lifestyle  . Physical activity    Days per week: Not on file    Minutes per session: Not on file  . Stress: Not on file  Relationships  . Social Herbalist on phone: Not on file    Gets together: Not on file    Attends religious service: Not on file    Active member of club or organization: Not on file    Attends meetings of clubs or organizations: Not on file    Relationship status: Not on file  . Intimate partner violence    Fear of current or ex partner: Not on file    Emotionally abused: Not on file    Physically abused: Not on file    Forced sexual activity: Not on file  Other Topics Concern  . 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's family history includes Anxiety disorder in her sister; Cancer in her paternal aunt; Diabetes in her  father and paternal grandfather; Heart disease in her father, maternal grandmother, and paternal grandmother; Hypertension in her father, maternal grandmother, and paternal grandmother.      Objective:    Vitals:   06/22/19 1347  BP: 122/78  Pulse: (!) 103  Temp: 98.1 F (36.7 C)  SpO2: 98%    Physical Exam Well-developed well-nourished WF in no acute distress.  Height, Weight,201 BMI 34.5  HEENT; nontraumatic normocephalic, EOMI, PER R LA, sclera anicteric. Oropharynx; not examined/mask/Covid Neck; supple, no JVD Cardiovascular; regular rate and rhythm with S1-S2, no murmur rub or gallop Pulmonary; Clear bilaterally Abdomen; soft,  nondistended, no palpable mass or hepatosplenomegaly, bowel sounds are active, she is tender in the left  upper quadrant and left mid quadrant and also along the left costal margin.  On palpation of the back she is tender with palpation on the left and around left side at the level of the lower thoracic vertebrae. Rectal; not done Skin; benign exam, no jaundice rash or appreciable lesions Extremities; no clubbing cyanosis or edema skin warm and dry Neuro/Psych; alert and oriented x4, grossly nonfocal mood and affect appropriate       Assessment & Plan:   #27 38 year old female with multiple month history of intermittent left mid back pain described as burning in nature.  Over the past month she has had increase in pain now radiating into the left side and has developed left upper quadrant abdominal pain. On exam she is tender in the left upper and left mid quadrant and also along the left costal margin. Sed rate significantly elevated at 82. Etiology of her symptoms is not entirely clear, rule out thoracic disc disease with radiculopathy, rule out other primary musculoskeletal inflammatory process.  Rule out intra-abdominal inflammatory process.  #2 chronic GERD #3 anxiety 4.  Status post cholecystectomy  Plan; will schedule for CT scan of the  abdomen and pelvis with contrast and CT of the lower thoracic spine. Continue omeprazole 40 mg p.o. every morning. She will finish current prescription for Carafate then discontinue Continue off aspirin and NSAIDs. Patient may need endoscopic evaluation if CT is unrevealing.  She will be established with Dr. Carlean Purl.   Genia Harold PA-C 06/22/2019   Cc: Carlena Hurl, PA-C

## 2019-07-05 ENCOUNTER — Other Ambulatory Visit: Payer: Self-pay

## 2019-07-05 ENCOUNTER — Ambulatory Visit (INDEPENDENT_AMBULATORY_CARE_PROVIDER_SITE_OTHER)
Admission: RE | Admit: 2019-07-05 | Discharge: 2019-07-05 | Disposition: A | Discharge status: Home / Self Care | Payer: 59 | Source: Ambulatory Visit | Attending: Physician Assistant | Admitting: Physician Assistant

## 2019-07-05 DIAGNOSIS — R1012 Left upper quadrant pain: Secondary | ICD-10-CM

## 2019-07-05 DIAGNOSIS — M545 Low back pain, unspecified: Secondary | ICD-10-CM

## 2019-07-05 DIAGNOSIS — R109 Unspecified abdominal pain: Secondary | ICD-10-CM | POA: Diagnosis not present

## 2019-07-05 DIAGNOSIS — M47814 Spondylosis without myelopathy or radiculopathy, thoracic region: Secondary | ICD-10-CM | POA: Diagnosis not present

## 2019-07-05 MED ORDER — IOHEXOL 300 MG/ML  SOLN
100.0000 mL | Freq: Once | INTRAMUSCULAR | Status: AC | PRN
  Administered 2019-07-05: 14:00:00 100 mL via INTRAVENOUS

## 2019-08-09 ENCOUNTER — Other Ambulatory Visit: Payer: Self-pay | Admitting: Medical

## 2019-08-09 DIAGNOSIS — F411 Generalized anxiety disorder: Secondary | ICD-10-CM

## 2019-08-10 MED FILL — SERTRALINE HCL 50 MG TABLET: 50 | 90 days supply | Qty: 270 | Fill #0

## 2019-08-15 ENCOUNTER — Ambulatory Visit: Payer: 59 | Attending: Internal Medicine

## 2019-08-15 DIAGNOSIS — Z20822 Contact with and (suspected) exposure to covid-19: Secondary | ICD-10-CM | POA: Diagnosis not present

## 2019-08-17 LAB — NOVEL CORONAVIRUS, NAA: SARS-CoV-2, NAA: DETECTED — AB

## 2019-08-24 ENCOUNTER — Other Ambulatory Visit: Payer: Self-pay | Admitting: Medical

## 2019-08-25 MED FILL — ALPRAZolam 0.5 MG TABS: 0.5 | 30 days supply | Qty: 30 | Fill #0

## 2019-08-25 MED FILL — OMEPRAZOLE 40 MG CPDR: 40 | 90 days supply | Qty: 90 | Fill #2

## 2019-09-26 MED FILL — ALPRAZolam 0.5 MG TABS: 0.5 | 30 days supply | Qty: 30 | Fill #1

## 2019-09-26 MED FILL — LEVOTHYROXINE 50 MCG TABLET: 50 | 90 days supply | Qty: 90 | Fill #2

## 2019-10-18 DIAGNOSIS — Z6834 Body mass index (BMI) 34.0-34.9, adult: Secondary | ICD-10-CM | POA: Diagnosis not present

## 2019-10-18 DIAGNOSIS — Z01419 Encounter for gynecological examination (general) (routine) without abnormal findings: Secondary | ICD-10-CM | POA: Diagnosis not present

## 2019-10-25 ENCOUNTER — Other Ambulatory Visit: Payer: Self-pay | Admitting: Medical

## 2019-10-25 DIAGNOSIS — F411 Generalized anxiety disorder: Secondary | ICD-10-CM

## 2019-10-27 MED FILL — SERTRALINE HCL 50 MG TABLET: 50 | 90 days supply | Qty: 270 | Fill #0

## 2019-12-26 DIAGNOSIS — M25571 Pain in right ankle and joints of right foot: Secondary | ICD-10-CM | POA: Diagnosis not present

## 2019-12-26 MED FILL — predniSONE 10 MG TABS: 10 | 6 days supply | Qty: 21 | Fill #0

## 2019-12-26 MED FILL — GABAPENTIN 300 MG CAPSULE: 300 | 30 days supply | Qty: 30 | Fill #0

## 2020-02-02 ENCOUNTER — Other Ambulatory Visit: Payer: Self-pay | Admitting: Medical

## 2020-02-02 DIAGNOSIS — F411 Generalized anxiety disorder: Secondary | ICD-10-CM

## 2020-02-02 MED FILL — ALPRAZolam 0.5 MG TABS: 0.5 | 30 days supply | Qty: 30 | Fill #2

## 2020-02-03 NOTE — Telephone Encounter (Signed)
Call to schedule yearly physical.   Then send current refill without additional refill.   Last CPX 01/2019

## 2020-02-05 ENCOUNTER — Telehealth: Payer: Self-pay | Admitting: Medical

## 2020-02-05 ENCOUNTER — Other Ambulatory Visit: Payer: Self-pay | Admitting: Medical

## 2020-02-05 DIAGNOSIS — F411 Generalized anxiety disorder: Secondary | ICD-10-CM

## 2020-02-05 MED ORDER — SERTRALINE HCL 50 MG PO TABS: 150.0000 mg | ORAL_TABLET | Freq: Every day | ORAL | 0 refills | Status: DC

## 2020-02-05 MED FILL — SERTRALINE HCL 50 MG TABS: 50 | 90 days supply | Qty: 270 | Fill #0

## 2020-02-05 NOTE — Telephone Encounter (Signed)
Medication sent.

## 2020-02-05 NOTE — Telephone Encounter (Signed)
Pt. Called back and was scheduled for a CPE and she wanted to know if she could get her sertraline filled now since she is scheduled.

## 2020-02-22 DIAGNOSIS — R102 Pelvic and perineal pain: Secondary | ICD-10-CM | POA: Diagnosis not present

## 2020-02-22 DIAGNOSIS — N760 Acute vaginitis: Secondary | ICD-10-CM | POA: Diagnosis not present

## 2020-03-04 ENCOUNTER — Ambulatory Visit (INDEPENDENT_AMBULATORY_CARE_PROVIDER_SITE_OTHER): Payer: 59 | Admitting: Medical

## 2020-03-04 ENCOUNTER — Encounter: Payer: Self-pay | Admitting: Medical

## 2020-03-04 ENCOUNTER — Other Ambulatory Visit: Payer: Self-pay

## 2020-03-04 VITALS — BP 116/76 | HR 73 | Ht 64.0 in | Wt 208.2 lb

## 2020-03-04 DIAGNOSIS — Z8249 Family history of ischemic heart disease and other diseases of the circulatory system: Secondary | ICD-10-CM

## 2020-03-04 DIAGNOSIS — F324 Major depressive disorder, single episode, in partial remission: Secondary | ICD-10-CM

## 2020-03-04 DIAGNOSIS — K760 Fatty (change of) liver, not elsewhere classified: Secondary | ICD-10-CM

## 2020-03-04 DIAGNOSIS — E559 Vitamin D deficiency, unspecified: Secondary | ICD-10-CM

## 2020-03-04 DIAGNOSIS — Z9049 Acquired absence of other specified parts of digestive tract: Secondary | ICD-10-CM | POA: Diagnosis not present

## 2020-03-04 DIAGNOSIS — F411 Generalized anxiety disorder: Secondary | ICD-10-CM

## 2020-03-04 DIAGNOSIS — K219 Gastro-esophageal reflux disease without esophagitis: Secondary | ICD-10-CM

## 2020-03-04 DIAGNOSIS — Z0000 Encounter for general adult medical examination without abnormal findings: Secondary | ICD-10-CM | POA: Diagnosis not present

## 2020-03-04 DIAGNOSIS — E669 Obesity, unspecified: Secondary | ICD-10-CM

## 2020-03-04 DIAGNOSIS — E039 Hypothyroidism, unspecified: Secondary | ICD-10-CM

## 2020-03-04 NOTE — Progress Notes (Signed)
Subjective:   HPI  Heather Maynard is a 39 y.o. female who presents for Chief Complaint  Patient presents with  . Annual Exam    with fasting labs     Patient Care Team: Nekoda Chock, Kermit Balo, PA-C as PCP - General (Family Medicine) Sees dentist Sees eye doctor Sees gyn, [facility]  Concerns: Lately more depressed mood due to family and husband issues.   Is strongly considering seeing counseling . Has seen counseling in the past but gets side tracked/busy and doesn't schedule .  compliant with medication Zoloft.   Uses xanax occasionally  Hypothyroidism - compliant with medication  Just had pap smear last week with gyn, sees dentist soon  Reviewed their medical, surgical, family, social, medication, and allergy history and updated chart as appropriate.  Past Medical History:  Diagnosis Date  . Acid indigestion    OTC as needed  . Anxiety    panic attacks  . Bimalleolar ankle fracture 08/24/2013   right  . Depression   . Headache   . Wears contact lenses     Past Surgical History:  Procedure Laterality Date  . CHOLECYSTECTOMY N/A 12/19/2017   Procedure: LAPAROSCOPIC CHOLECYSTECTOMY;  Surgeon: Kinsinger, De Blanch, MD;  Location[facility] OR;  Service: General;  Laterality: N/A;  . LAPAROSCOPIC TUBAL LIGATION Bilateral 08/01/2013   Procedure: LAPAROSCOPIC TUBAL LIGATION WITH FILSHIE CLIPS;  Surgeon: Meriel Pica, MD;  Location: [facility] ORS;  Service: Gynecology;  Laterality: Bilateral;  . ORIF ANKLE FRACTURE Right 08/29/2013   Procedure: OPEN REDUCTION INTERNAL FIXATION (ORIFRIGHT BIMALLEOLAR  ANKLE FRACTURE;  Surgeon: Sheral Apley, MD;  Location: [facility];  Service: Orthopedics;  Laterality: Right;    Social History   Socioeconomic History  . Marital status: Married    Spouse name: Not on file  . Number of children: Not on file  . Years of education: Not on file  . Highest education level: Not on file  Occupational History  . Not on  file  Tobacco Use  . Smoking status: Never Smoker  . Smokeless tobacco: Never Used  Substance and Sexual Activity  . Alcohol use: No  . Drug use: No  . Sexual activity: Yes    Birth control/protection: Surgical  Other Topics Concern  . 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 Difficulty of Paying Living Expenses:   Food Insecurity:   . Worried About Programme researcher, broadcasting/film/video in the Last Year:   . Barista in the Last Year:   Transportation Needs:   . Freight forwarder (Medical):   Marland Kitchen Lack of Transportation (Non-Medical):   Physical Activity:   . Days of Exercise per Week:   . Minutes of Exercise per Session:   Stress:   . Feeling of Stress :   Social Connections:   . Frequency of Communication with Friends and Family:   . Frequency of Social Gatherings with Friends and Family:   . Attends Religious Services:   . Active Member of Clubs or Organizations:   . Attends Banker Meetings:   Marland Kitchen Marital Status:   Intimate Partner Violence:   . Fear of Current or Ex-Partner:   . Emotionally Abused:   Marland Kitchen Physically Abused:   . Sexually Abused:     Family History  Problem Relation Age of Onset  . Hypertension Father   . Diabetes Father        insulin  . Heart disease Father        MI  . Anxiety disorder Sister   . Heart disease Maternal Grandmother   . Hypertension Maternal Grandmother   . Heart disease Paternal Grandmother   . Hypertension Paternal Grandmother   . Diabetes Paternal Grandfather        insulin  . Cancer Paternal Aunt        colon     Current Outpatient Medications:  .  ALPRAZolam (XANAX) 0.5 MG tablet, TAKE 1 TABLET (0.5 MG TOTAL) BY MOUTH AT BEDTIME AS NEEDED FOR ANXIETY., Disp: 30 tablet, Rfl: 2 .  levothyroxine (SYNTHROID) 50 MCG tablet, Take 1 tablet (50 mcg  total) by mouth daily., Disp: 90 tablet, Rfl: 3 .  omeprazole (PRILOSEC) 40 MG capsule, Take 1 capsule (40 mg total) by mouth daily., Disp: 90 capsule, Rfl: 3 .  sertraline (ZOLOFT) 50 MG tablet, Take 3 tablets (150 mg total) by mouth daily., Disp: 270 tablet, Rfl: 0  No Known Allergies   Review of Systems Constitutional: -fever, -chills, -sweats, -unexpected weight change, -decreased appetite, -fatigue Allergy: -sneezing, -itching, -congestion Dermatology: -changing moles, --rash, -lumps ENT: -runny nose, -ear pain, -sore throat, -hoarseness, -sinus pain, -teeth pain, - ringing in ears, -hearing loss, -nosebleeds Cardiology: -chest pain, -palpitations, -swelling, -difficulty breathing when lying flat, -waking up short of breath Respiratory: -cough, -shortness of breath, -difficulty breathing with exercise or exertion, -wheezing, -coughing up blood Gastroenterology: -abdominal pain, -nausea, -vomiting, -diarrhea, -constipation, -blood in stool, -changes in bowel movement, -difficulty swallowing or eating Hematology: -bleeding, -bruising  Musculoskeletal: -joint aches, -muscle aches, -joint swelling, -back pain, -neck pain, -cramping, -changes in gait Ophthalmology: denies vision changes, eye redness, itching, discharge Urology: -burning with urination, -difficulty urinating, -blood in urine, -urinary frequency, -urgency, -incontinence Neurology: -headache, -weakness, -tingling, -numbness, -memory loss, -falls, -dizziness Psychology: +depressed mood, -agitation, -sleep problems Breast/gyn: -breast tenderness, -discharge, -lumps, -vaginal discharge,- irregular periods, -heavy periods     Objective:  BP 116/76   Pulse 73   Ht 5\' 4"  (1.626 m)   Wt (!) 208 lb 3.2 oz (94.4 kg)   SpO2 96%   BMI 35.74 kg/m   General appearance: alert, no distress, WD/WN, white female Skin:scattered macules, no worrisome lesions, butterfly tattoos left forearm Neck: supple, no lymphadenopathy, no  thyromegaly, no masses, normal ROM, no bruits Chest: non tender, normal shape and expansion Heart: RRR, normal S1, S2, no murmurs Lungs: CTA bilaterally, no wheezes, rhonchi, or rales Abdomen: +bs, soft, non tender, non distended, no masses, no hepatomegaly, no splenomegaly, no bruits Back: non tender, normal ROM, no scoliosis Musculoskeletal: upper extremities non tender, no obvious deformity, normal ROM throughout, lower extremities non tender, no obvious deformity, normal ROM throughout Extremities: no edema, no cyanosis, no clubbing Pulses: 2+ symmetric, upper and lower extremities, normal cap refill Neurological: alert, oriented x 3, CN2-12 intact, strength normal upper extremities and lower extremities, sensation normal throughout, DTRs 2+ throughout, no cerebellar signs, gait normal Psychiatric: normal affect, behavior normal, pleasant  Breast/gyn/rectal - deferred to gynecology     Assessment and Plan :   Encounter Diagnoses  Name Primary?  . Encounter for health maintenance examination in adult Yes  . Gastroesophageal reflux disease without esophagitis   . Hypothyroidism, unspecified type   . Generalized anxiety disorder   . Vitamin D deficiency   . Obesity with serious comorbidity, unspecified classification,  unspecified obesity type   . Family history of heart disease   . S/P cholecystectomy   . Hepatic steatosis   . Depression, major, single episode, in partial remission [facility])     Physical exam - discussed and counseled on healthy lifestyle, diet, exercise, preventative care, vaccinations, sick and well care, proper use of emergency dept and after hours care, and addressed their concerns.    Health screening: Advised they see their eye doctor yearly for routine vision care. Advised they see their dentist yearly for routine dental care including hygiene visits twice yearly. See your gynecologist yearly for routine gynecological care.  Cancer screening Counseled on  self breast exams, mammograms, cervical cancer screening  Vaccinations: Advised yearly influenza vaccine  Due for covid and Td vaccine.  She will get covid vaccine next, plan for Td in 2 months.   Separate significant chronic issues discussed: Obesity - counseled on strategies to lose weight, goal setting, option for therapy including weight loss study, weight management clinic referral, medications, other  hypothyroidism - labs today, c/t same medication  Depression and anxiety - strongly advised counseling, c/t current medicaiton  GERD - does fine on current PPI therapy, discussed risks/benefits of therapy  Hx/o vit d deficiency - not on supplement.  Labs today  Obesity , fatty liver - counseling on need for weight loss through healthy diet, exercise   Brodi was seen today for annual exam.  Diagnoses and all orders for this visit:  Encounter for health maintenance examination in adult -     TSH -     Comprehensive metabolic panel -     CBC -     Lipid panel -     VITAMIN D 25 Hydroxy (Vit-D Deficiency, Fractures)  Gastroesophageal reflux disease without esophagitis  Hypothyroidism, unspecified type -     TSH  Generalized anxiety disorder  Vitamin D deficiency -     VITAMIN D 25 Hydroxy (Vit-D Deficiency, Fractures)  Obesity with serious comorbidity, unspecified classification, unspecified obesity type  Family history of heart disease  S/P cholecystectomy  Hepatic steatosis  Depression, major, single episode, in partial remission (HCC)    Follow-up pending labs, yearly for physical

## 2020-03-05 ENCOUNTER — Other Ambulatory Visit: Payer: Self-pay

## 2020-03-05 ENCOUNTER — Other Ambulatory Visit: Payer: Self-pay | Admitting: Medical

## 2020-03-05 DIAGNOSIS — E669 Obesity, unspecified: Secondary | ICD-10-CM

## 2020-03-05 DIAGNOSIS — K219 Gastro-esophageal reflux disease without esophagitis: Secondary | ICD-10-CM

## 2020-03-05 LAB — LIPID PANEL
Chol/HDL Ratio: 4.5 ratio — ABNORMAL HIGH (ref 0.0–4.4)
Cholesterol, Total: 225 mg/dL — ABNORMAL HIGH (ref 100–199)
HDL: 50 mg/dL (ref >39)
LDL Chol Calc (NIH): 159 mg/dL — ABNORMAL HIGH (ref 0–99)
Triglycerides: 91 mg/dL (ref 0–149)
VLDL Cholesterol Cal: 16 mg/dL (ref 5–40)

## 2020-03-05 LAB — COMPREHENSIVE METABOLIC PANEL
ALT: 19 IU/L (ref 0–32)
AST: 20 IU/L (ref 0–40)
Albumin/Globulin Ratio: 1.7 (ref 1.2–2.2)
Albumin: 4.8 g/dL (ref 3.8–4.8)
Alkaline Phosphatase: 84 IU/L (ref 48–121)
BUN/Creatinine Ratio: 10 (ref 9–23)
BUN: 6 mg/dL (ref 6–20)
Bilirubin Total: 0.4 mg/dL (ref 0.0–1.2)
CO2: 24 mmol/L (ref 20–29)
Calcium: 9.8 mg/dL (ref 8.7–10.2)
Chloride: 101 mmol/L (ref 96–106)
Creatinine, Ser: 0.62 mg/dL (ref 0.57–1.00)
GFR calc Af Amer: 132 mL/min/{1.73_m2} (ref >59)
GFR calc non Af Amer: 115 mL/min/{1.73_m2} (ref >59)
Globulin, Total: 2.9 g/dL (ref 1.5–4.5)
Glucose: 90 mg/dL (ref 65–99)
Potassium: 4 mmol/L (ref 3.5–5.2)
Sodium: 139 mmol/L (ref 134–144)
Total Protein: 7.7 g/dL (ref 6.0–8.5)

## 2020-03-05 LAB — CBC
Hematocrit: 36.4 % (ref 34.0–46.6)
Hemoglobin: 11.5 g/dL (ref 11.1–15.9)
MCH: 25.2 pg — ABNORMAL LOW (ref 26.6–33.0)
MCHC: 31.6 g/dL (ref 31.5–35.7)
MCV: 80 fL (ref 79–97)
Platelets: 332 10*3/uL (ref 150–450)
RBC: 4.56 x10E6/uL (ref 3.77–5.28)
RDW: 15.6 % — ABNORMAL HIGH (ref 11.7–15.4)
WBC: 6.4 10*3/uL (ref 3.4–10.8)

## 2020-03-05 LAB — VITAMIN D 25 HYDROXY (VIT D DEFICIENCY, FRACTURES): Vit D, 25-Hydroxy: 21.9 ng/mL — ABNORMAL LOW (ref 30.0–100.0)

## 2020-03-05 LAB — TSH: TSH: 2.39 u[IU]/mL (ref 0.450–4.500)

## 2020-03-05 MED ORDER — ALPRAZOLAM 0.5 MG PO TABS: 0.5000 mg | ORAL_TABLET | Freq: Every evening | ORAL | 2 refills | Status: DC | PRN

## 2020-03-05 MED ORDER — OMEPRAZOLE 40 MG PO CPDR: 40.0000 mg | DELAYED_RELEASE_CAPSULE | Freq: Every day | ORAL | 3 refills | Status: DC

## 2020-03-05 MED ORDER — LEVOTHYROXINE SODIUM 50 MCG PO TABS: 50.0000 ug | ORAL_TABLET | Freq: Every day | ORAL | 3 refills | Status: DC

## 2020-03-05 MED ORDER — VITAMIN D 25 MCG (1000 UNIT) PO TABS: 1000.0000 [IU] | ORAL_TABLET | Freq: Every day | ORAL | 3 refills | Status: DC

## 2020-03-05 MED FILL — OMEPRAZOLE 40 MG CPDR: 40 | 90 days supply | Qty: 90 | Fill #0

## 2020-03-05 MED FILL — LEVOTHYROXINE 50 MCG TABLET: 50 | 90 days supply | Qty: 90 | Fill #0

## 2020-03-05 MED FILL — VITAMIN D3 25 MCG (1000 UT): 25 MCG | 90 days supply | Qty: 90 | Fill #0

## 2020-03-05 MED FILL — ALPRAZolam 0.5 MG TABS: 0.5 | 30 days supply | Qty: 30 | Fill #0

## 2020-03-05 NOTE — Progress Notes (Signed)
NA

## 2020-03-06 DIAGNOSIS — R102 Pelvic and perineal pain: Secondary | ICD-10-CM | POA: Diagnosis not present

## 2020-04-22 DIAGNOSIS — H5213 Myopia, bilateral: Secondary | ICD-10-CM | POA: Diagnosis not present

## 2020-05-02 ENCOUNTER — Other Ambulatory Visit: Payer: Self-pay | Admitting: Medical

## 2020-05-02 DIAGNOSIS — F411 Generalized anxiety disorder: Secondary | ICD-10-CM

## 2020-05-04 MED FILL — SERTRALINE HCL 50 MG TABLET: 50 | 90 days supply | Qty: 270 | Fill #0

## 2020-06-05 MED FILL — ALPRAZolam 0.5 MG TABS: 0.5 | 30 days supply | Qty: 30 | Fill #1

## 2020-06-05 MED FILL — OMEPRAZOLE 40 MG CPDR: 40 | 90 days supply | Qty: 90 | Fill #1

## 2020-07-26 ENCOUNTER — Other Ambulatory Visit: Payer: Self-pay | Admitting: Medical

## 2020-07-26 DIAGNOSIS — F411 Generalized anxiety disorder: Secondary | ICD-10-CM

## 2020-07-26 MED FILL — SERTRALINE HCL 50 MG TABLET: 50 | 90 days supply | Qty: 270 | Fill #0

## 2020-08-14 MED FILL — ALPRAZolam 0.5 MG TABS: 0.5 | 30 days supply | Qty: 30 | Fill #2

## 2020-08-29 MED FILL — LEVOTHYROXINE 50 MCG TABLET: 50 | 90 days supply | Qty: 90 | Fill #1

## 2020-09-04 DIAGNOSIS — Z1152 Encounter for screening for COVID-19: Secondary | ICD-10-CM | POA: Diagnosis not present

## 2020-09-24 MED FILL — OMEPRAZOLE 40 MG CPDR: 40 | 90 days supply | Qty: 90 | Fill #2

## 2020-11-05 ENCOUNTER — Other Ambulatory Visit: Payer: Self-pay | Admitting: Medical

## 2020-11-05 DIAGNOSIS — F411 Generalized anxiety disorder: Secondary | ICD-10-CM

## 2020-11-06 ENCOUNTER — Other Ambulatory Visit: Payer: Self-pay | Admitting: Medical

## 2020-11-06 MED FILL — SERTRALINE HCL 50 MG TABS: 50 | 90 days supply | Qty: 270 | Fill #0

## 2020-12-19 ENCOUNTER — Other Ambulatory Visit (HOSPITAL_COMMUNITY): Payer: Self-pay

## 2020-12-19 MED FILL — Levothyroxine Sodium Tab 50 MCG: ORAL | 90 days supply | Qty: 90 | Fill #0 | Status: AC

## 2020-12-19 MED FILL — Omeprazole Cap Delayed Release 40 MG: ORAL | 90 days supply | Qty: 90 | Fill #0 | Status: AC

## 2020-12-21 ENCOUNTER — Other Ambulatory Visit (HOSPITAL_COMMUNITY): Payer: Self-pay

## 2021-01-24 ENCOUNTER — Other Ambulatory Visit: Payer: Self-pay | Admitting: Medical

## 2021-01-24 ENCOUNTER — Other Ambulatory Visit (HOSPITAL_COMMUNITY): Payer: Self-pay

## 2021-01-24 MED ORDER — ALPRAZOLAM 0.5 MG PO TABS
ORAL_TABLET | ORAL | 0 refills | Status: DC
  Filled 2021-01-24: qty 12, 12d supply, fill #0

## 2021-02-06 ENCOUNTER — Other Ambulatory Visit: Payer: Self-pay | Admitting: Medical

## 2021-02-06 ENCOUNTER — Other Ambulatory Visit (HOSPITAL_COMMUNITY): Payer: Self-pay

## 2021-02-06 DIAGNOSIS — F411 Generalized anxiety disorder: Secondary | ICD-10-CM

## 2021-02-06 MED ORDER — SERTRALINE HCL 50 MG PO TABS
ORAL_TABLET | Freq: Every day | ORAL | 0 refills | Status: DC
  Filled 2021-02-06: qty 270, 90d supply, fill #0

## 2021-02-07 ENCOUNTER — Other Ambulatory Visit (HOSPITAL_COMMUNITY): Payer: Self-pay

## 2021-03-28 DIAGNOSIS — Z978 Presence of other specified devices: Secondary | ICD-10-CM | POA: Diagnosis not present

## 2021-03-31 ENCOUNTER — Other Ambulatory Visit (HOSPITAL_COMMUNITY): Payer: Self-pay

## 2021-03-31 ENCOUNTER — Other Ambulatory Visit: Payer: Self-pay | Admitting: Student

## 2021-03-31 ENCOUNTER — Other Ambulatory Visit: Payer: Self-pay | Admitting: Medical

## 2021-03-31 DIAGNOSIS — K219 Gastro-esophageal reflux disease without esophagitis: Secondary | ICD-10-CM

## 2021-03-31 DIAGNOSIS — Z978 Presence of other specified devices: Secondary | ICD-10-CM

## 2021-03-31 MED ORDER — OMEPRAZOLE 40 MG PO CPDR
DELAYED_RELEASE_CAPSULE | Freq: Every day | ORAL | 0 refills | Status: DC
  Filled 2021-03-31: qty 90, 90d supply, fill #0

## 2021-03-31 MED ORDER — LEVOTHYROXINE SODIUM 50 MCG PO TABS
ORAL_TABLET | Freq: Every day | ORAL | 0 refills | Status: DC
  Filled 2021-03-31: qty 90, 90d supply, fill #0

## 2021-04-21 ENCOUNTER — Ambulatory Visit
Admission: RE | Admit: 2021-04-21 | Discharge: 2021-04-21 | Disposition: A | Discharge status: Home / Self Care | Payer: 59 | Source: Ambulatory Visit | Attending: Student | Admitting: Student

## 2021-04-21 DIAGNOSIS — Z978 Presence of other specified devices: Secondary | ICD-10-CM

## 2021-04-21 DIAGNOSIS — M19071 Primary osteoarthritis, right ankle and foot: Secondary | ICD-10-CM | POA: Diagnosis not present

## 2021-05-14 ENCOUNTER — Other Ambulatory Visit (HOSPITAL_COMMUNITY): Payer: Self-pay

## 2021-05-14 ENCOUNTER — Other Ambulatory Visit: Payer: Self-pay | Admitting: Medical

## 2021-05-14 DIAGNOSIS — F411 Generalized anxiety disorder: Secondary | ICD-10-CM

## 2021-05-14 MED ORDER — SERTRALINE HCL 50 MG PO TABS
ORAL_TABLET | Freq: Every day | ORAL | 0 refills | Status: DC
  Filled 2021-05-14: qty 90, 30d supply, fill #0

## 2021-05-28 ENCOUNTER — Encounter: Payer: Self-pay | Admitting: Medical

## 2021-05-28 ENCOUNTER — Ambulatory Visit (INDEPENDENT_AMBULATORY_CARE_PROVIDER_SITE_OTHER): Payer: 59 | Admitting: Medical

## 2021-05-28 ENCOUNTER — Other Ambulatory Visit (HOSPITAL_COMMUNITY): Payer: Self-pay

## 2021-05-28 ENCOUNTER — Telehealth: Payer: Self-pay

## 2021-05-28 ENCOUNTER — Other Ambulatory Visit: Payer: Self-pay

## 2021-05-28 VITALS — BP 128/84 | HR 74 | Ht 64.0 in | Wt 195.2 lb

## 2021-05-28 DIAGNOSIS — Z8249 Family history of ischemic heart disease and other diseases of the circulatory system: Secondary | ICD-10-CM | POA: Diagnosis not present

## 2021-05-28 DIAGNOSIS — F411 Generalized anxiety disorder: Secondary | ICD-10-CM

## 2021-05-28 DIAGNOSIS — E039 Hypothyroidism, unspecified: Secondary | ICD-10-CM | POA: Diagnosis not present

## 2021-05-28 DIAGNOSIS — K760 Fatty (change of) liver, not elsewhere classified: Secondary | ICD-10-CM | POA: Diagnosis not present

## 2021-05-28 DIAGNOSIS — Z23 Encounter for immunization: Secondary | ICD-10-CM | POA: Diagnosis not present

## 2021-05-28 DIAGNOSIS — Z9049 Acquired absence of other specified parts of digestive tract: Secondary | ICD-10-CM

## 2021-05-28 DIAGNOSIS — F324 Major depressive disorder, single episode, in partial remission: Secondary | ICD-10-CM | POA: Diagnosis not present

## 2021-05-28 DIAGNOSIS — R109 Unspecified abdominal pain: Secondary | ICD-10-CM | POA: Diagnosis not present

## 2021-05-28 DIAGNOSIS — E559 Vitamin D deficiency, unspecified: Secondary | ICD-10-CM

## 2021-05-28 DIAGNOSIS — Z0000 Encounter for general adult medical examination without abnormal findings: Secondary | ICD-10-CM | POA: Diagnosis not present

## 2021-05-28 DIAGNOSIS — K219 Gastro-esophageal reflux disease without esophagitis: Secondary | ICD-10-CM | POA: Diagnosis not present

## 2021-05-28 LAB — POCT URINALYSIS DIP (PROADVANTAGE DEVICE)
Bilirubin, UA: NEGATIVE
Blood, UA: NEGATIVE
Glucose, UA: NEGATIVE mg/dL
Ketones, POC UA: NEGATIVE mg/dL
Leukocytes, UA: NEGATIVE
Nitrite, UA: NEGATIVE
Specific Gravity, Urine: 1.025
Urobilinogen, Ur: 0.2
pH, UA: 6 (ref 5.0–8.0)

## 2021-05-28 MED ORDER — LEVOTHYROXINE SODIUM 50 MCG PO TABS
ORAL_TABLET | Freq: Every day | ORAL | 0 refills | Status: DC
  Filled 2021-05-28 – 2021-07-14 (×2): qty 90, 90d supply, fill #0

## 2021-05-28 MED ORDER — SERTRALINE HCL 50 MG PO TABS
ORAL_TABLET | Freq: Every day | ORAL | 11 refills | Status: DC
  Filled 2021-05-28 – 2021-06-17 (×2): qty 90, 30d supply, fill #0
  Filled 2021-07-14: qty 90, 30d supply, fill #1
  Filled 2021-08-14: qty 90, 30d supply, fill #2

## 2021-05-28 MED ORDER — OMEPRAZOLE 40 MG PO CPDR
DELAYED_RELEASE_CAPSULE | Freq: Every day | ORAL | 0 refills | Status: DC
Start: 2021-05-28 — End: 2021-09-14
  Filled 2021-05-28 – 2021-06-24 (×2): qty 90, 90d supply, fill #0

## 2021-05-28 MED ORDER — ALPRAZOLAM 0.5 MG PO TABS
ORAL_TABLET | ORAL | 2 refills | Status: DC
Start: 2021-05-28 — End: 2021-11-04
  Filled 2021-05-28: qty 30, 30d supply, fill #0
  Filled 2021-07-14: qty 30, 30d supply, fill #1
  Filled 2021-10-15: qty 30, 30d supply, fill #2

## 2021-05-28 NOTE — Telephone Encounter (Signed)
Pt. Told me at check out that she forgot to let you know that she needed a refill on her xanax and sertraline to [facility].

## 2021-05-28 NOTE — Progress Notes (Signed)
Subjective:   HPI  Heather Maynard is a 40 y.o. female who presents for Chief Complaint  Patient presents with   Annual Exam    Is scheduled  for pap next month at [facility]    Patient Care Team: Tysinger, Leward Quan as PCP - General (Family Medicine) Sees dentist Sees eye doctor Dr. Molli Posey, gynecology Dr. Mechele Claude, orthopedics   Concerns: Doing okay but has been experiencing little pain in her upper back, upper left abdomen for the last week or so.  No stool changes, no blood in the stool or urine, no urinary changes, no fever, no body aches or chills.  She is fasting today.  Compliant with medications, mood has been okay  Reviewed their medical, surgical, family, social, medication, and allergy history and updated chart as appropriate.  Past Medical History:  Diagnosis Date   Acid indigestion    OTC as needed   Anxiety    panic attacks   Bimalleolar ankle fracture 08/24/2013   right   Depression    Headache    Wears contact lenses     Family History  Problem Relation Age of Onset   Hypertension Father    Diabetes Father        insulin   Heart disease Father        MI   Anxiety disorder Sister    Heart disease Maternal Grandmother    Hypertension Maternal Grandmother    Heart disease Paternal Grandmother    Hypertension Paternal Grandmother    Diabetes Paternal Grandfather        insulin   Cancer Paternal Aunt        colon     Current Outpatient Medications:    ALPRAZolam (XANAX) 0.5 MG tablet, TAKE 1 TABLET (0.5 MG TOTAL) BY MOUTH AT BEDTIME AS NEEDED FOR ANXIETY., Disp: 12 tablet, Rfl: 0   levothyroxine (SYNTHROID) 50 MCG tablet, TAKE 1 TABLET BY MOUTH DAILY., Disp: 90 tablet, Rfl: 0   omeprazole (PRILOSEC) 40 MG capsule, TAKE 1 CAPSULE BY MOUTH DAILY., Disp: 90 capsule, Rfl: 0   sertraline (ZOLOFT) 50 MG tablet, Take 3 tablets by mouth once daily., Disp: 90 tablet, Rfl: 0  No Known Allergies   Review of Systems Constitutional:  -fever, -chills, -sweats, -unexpected weight change, -decreased appetite, -fatigue Allergy: -sneezing, -itching, -congestion Dermatology: -changing moles, --rash, -lumps ENT: -runny nose, -ear pain, -sore throat, -hoarseness, -sinus pain, -teeth pain, - ringing in ears, -hearing loss, -nosebleeds Cardiology: -chest pain, -palpitations, -swelling, -difficulty breathing when lying flat, -waking up short of breath Respiratory: -cough, -shortness of breath, -difficulty breathing with exercise or exertion, -wheezing, -coughing up blood Gastroenterology: +abdominal pain, -nausea, -vomiting, -diarrhea, -constipation, -blood in stool, -changes in bowel movement, -difficulty swallowing or eating Hematology: -bleeding, -bruising  Musculoskeletal: -joint aches, -muscle aches, -joint swelling, +left upper back pain, -neck pain, -cramping, -changes in gait Ophthalmology: denies vision changes, eye redness, itching, discharge Urology: -burning with urination, -difficulty urinating, -blood in urine, -urinary frequency, -urgency, -incontinence Neurology: -headache, -weakness, -tingling, -numbness, -memory loss, -falls, -dizziness Psychology: -depressed mood, -agitation, -sleep problems Breast/gyn: -breast tendnerss, -discharge, -lumps, -vaginal discharge,- irregular periods, -heavy periods   Depression screen [facility] 2/9 05/28/2021 03/04/2020 01/13/2019 02/17/2018  Decreased Interest 0 1 0 0  Down, Depressed, Hopeless 0 1 0 0  PHQ - 2 Score 0 2 0 0  Altered sleeping 2 1 - -  Tired, decreased energy 1 1 - -  Change in appetite 0 0 - -  Feeling  bad or failure about yourself  0 1 - -  Trouble concentrating 0 - - -  Moving slowly or fidgety/restless 0 0 - -  Suicidal thoughts 0 0 - -  PHQ-9 Score 3 5 - -  Difficult doing work/chores Not difficult at all Not difficult at all - -       Objective:  BP 128/84 (BP Location: Right Arm, Patient Position: Sitting)   Pulse 74   Ht 5' 4" (1.626 m)   Wt 195 lb 3.2 oz  (88.5 kg)   LMP 05/22/2021   SpO2 98%   BMI 33.51 kg/m   General appearance: alert, no distress, WD/WN, Caucasian female Skin: scattered macules, no worrisome lesions HEENT: normocephalic, conjunctiva/corneas normal, sclerae anicteric, PERRLA, EOMi, nares patent, no discharge or erythema, pharynx normal Oral cavity: MMM, tongue normal, teeth normal Neck: supple, no lymphadenopathy, no thyromegaly, no masses, normal ROM, no bruits Chest: non tender, normal shape and expansion Heart: RRR, normal S1, S2, no murmurs Lungs: CTA bilaterally, no wheezes, rhonchi, or rales Abdomen: +bs, soft, left upper abdomen tender, otherwise non tender, non distended, no masses, no hepatomegaly, no splenomegaly, no bruits Back: non tender, normal ROM, no scoliosis Musculoskeletal: upper extremities non tender, no obvious deformity, normal ROM throughout, lower extremities non tender, no obvious deformity, normal ROM throughout Extremities: no edema, no cyanosis, no clubbing Pulses: 2+ symmetric, upper and lower extremities, normal cap refill Neurological: alert, oriented x 3, CN2-12 intact, strength normal upper extremities and lower extremities, sensation normal throughout, DTRs 2+ throughout, no cerebellar signs, gait normal Psychiatric: normal affect, behavior normal, pleasant  Breast/gyn/rectal - deferred to gynecology   Assessment and Plan :   Encounter Diagnoses  Name Primary?   Encounter for health maintenance examination in adult Yes   Needs flu shot    Depression, major, single episode, in partial remission ([place])    Family history of heart disease    Gastroesophageal reflux disease without esophagitis    Generalized anxiety disorder    Hypothyroidism, unspecified type    Hepatic steatosis    S/P cholecystectomy    Vitamin D deficiency    Snoring    Abdominal discomfort      This visit was a preventative care visit, also known as wellness visit or routine physical.   Topics typically  include healthy lifestyle, diet, exercise, preventative care, vaccinations, sick and well care, proper use of emergency dept and after hours care, as well as other concerns.     Recommendations: Continue to return yearly for your annual wellness and preventative care visits.  This gives [place] a chance to discuss healthy lifestyle, exercise, vaccinations, review your chart record, and perform screenings where appropriate.  I recommend you see your eye doctor yearly for routine vision care.  I recommend you see your dentist yearly for routine dental care including hygiene visits twice yearly.  See your gynecologist yearly for routine gynecological care.   Vaccination recommendations were reviewed Immunization History  Administered Date(s) Administered   MMR 09/27/2011   Tdap 05/09/2009   Counseled on the influenza virus vaccine.  Vaccine information sheet given.  Influenza vaccine given after consent obtained.  Counseled on the Tdap (tetanus, diptheria, and acellular pertussis) vaccine.  Vaccine information sheet given. Tdap vaccine given after consent obtained.    Screening for cancer: Colon cancer screening: Age 38  Breast cancer screening: You should perform a self breast exam monthly.   We reviewed recommendations for regular mammograms and breast cancer screening.  Cervical cancer  screening: We reviewed recommendations for pap smear screening. Will request most recent pap from gyn   Skin cancer screening: Check your skin regularly for new changes, growing lesions, or other lesions of concern Come in for evaluation if you have skin lesions of concern.  Lung cancer screening: If you have a greater than 20 pack year history of tobacco use, then you may qualify for lung cancer screening with a chest CT scan.   Please call your insurance company to inquire about coverage for this test.  We currently don't have screenings for other cancers besides breast, cervical, colon, and  lung cancers.  If you have a strong family history of cancer or have other cancer screening concerns, please let me know.    Bone health: Get at least 150 minutes of aerobic exercise weekly Get weight bearing exercise at least once weekly Bone density test:  A bone density test is an imaging test that uses a type of X-ray to measure the amount of calcium and other minerals in your bones. The test may be used to diagnose or screen you for a condition that causes weak or thin bones (osteoporosis), predict your risk for a broken bone (fracture), or determine how well your osteoporosis treatment is working. The bone density test is recommended for females 70 and older, or females or males <29 if certain risk factors such as thyroid disease, long term use of steroids such as for asthma or rheumatological issues, vitamin D deficiency, estrogen deficiency, family history of osteoporosis, self or family history of fragility fracture in first degree relative.    Heart health: Get at least 150 minutes of aerobic exercise weekly Limit alcohol It is important to maintain a healthy blood pressure and healthy cholesterol numbers  Heart disease screening: Screening for heart disease includes screening for blood pressure, fasting lipids, glucose/diabetes screening, BMI height to weight ratio, reviewed of smoking status, physical activity, and diet.    Goals include blood pressure 120/80 or less, maintaining a healthy lipid/cholesterol profile, preventing diabetes or keeping diabetes numbers under good control, not smoking or using tobacco products, exercising most days per week or at least 150 minutes per week of exercise, and eating healthy variety of fruits and vegetables, healthy oils, and avoiding unhealthy food choices like fried food, fast food, high sugar and high cholesterol foods.    Other tests may possibly include EKG test, CT coronary calcium score, echocardiogram, exercise treadmill stress test.     Medical care options: I recommend you continue to seek care here first for routine care.  We try really hard to have available appointments Monday through Friday daytime hours for sick visits, acute visits, and physicals.  Urgent care should be used for after hours and weekends for significant issues that cannot wait till the next day.  The emergency department should be used for significant potentially life-threatening emergencies.  The emergency department is expensive, can often have long wait times for less significant concerns, so try to utilize primary care, urgent care, or telemedicine when possible to avoid unnecessary trips to the emergency department.  Virtual visits and telemedicine have been introduced since the pandemic started in 2020, and can be convenient ways to receive medical care.  We offer virtual appointments as well to assist you in a variety of options to seek medical care.   Separate significant issues discussed: Depression in partial remission-doing fine on current medication  Hypothyroidism-continue current medication, updated labs today  Abdominal discomfort-labs today, consider imaging, unclear etiology  Shonika was seen today for annual exam.  Diagnoses and all orders for this visit:  Encounter for health maintenance examination in adult -     Lipid panel -     Comprehensive metabolic panel -     CBC -     Thyroid Panel With TSH -     POCT Urinalysis DIP (Proadvantage Device) -     VITAMIN D 25 Hydroxy (Vit-D Deficiency, Fractures) -     Lipase  Needs flu shot -     Flu Vaccine QUAD 6+ mos PF IM (Fluarix Quad PF)  Depression, major, single episode, in partial remission ([place])  Family history of heart disease -     Lipid panel  Gastroesophageal reflux disease without esophagitis  Generalized anxiety disorder  Hypothyroidism, unspecified type -     Thyroid Panel With TSH  Hepatic steatosis -     Lipid panel  S/P cholecystectomy  Vitamin  D deficiency -     VITAMIN D 25 Hydroxy (Vit-D Deficiency, Fractures)  Snoring  Abdominal discomfort -     Lipase  Other orders -     Tdap vaccine greater than or equal to 7yo IM   Follow-up pending labs, yearly for physical

## 2021-05-29 ENCOUNTER — Other Ambulatory Visit (HOSPITAL_COMMUNITY): Payer: Self-pay

## 2021-05-29 ENCOUNTER — Other Ambulatory Visit: Payer: Self-pay | Admitting: Medical

## 2021-05-29 LAB — COMPREHENSIVE METABOLIC PANEL
ALT: 7 IU/L (ref 0–32)
AST: 13 IU/L (ref 0–40)
Albumin/Globulin Ratio: 1.8 (ref 1.2–2.2)
Albumin: 5.1 g/dL — ABNORMAL HIGH (ref 3.8–4.8)
Alkaline Phosphatase: 82 IU/L (ref 44–121)
BUN/Creatinine Ratio: 9 (ref 9–23)
BUN: 6 mg/dL (ref 6–24)
Bilirubin Total: 0.3 mg/dL (ref 0.0–1.2)
CO2: 21 mmol/L (ref 20–29)
Calcium: 9.8 mg/dL (ref 8.7–10.2)
Chloride: 101 mmol/L (ref 96–106)
Creatinine, Ser: 0.7 mg/dL (ref 0.57–1.00)
Globulin, Total: 2.9 g/dL (ref 1.5–4.5)
Glucose: 89 mg/dL (ref 70–99)
Potassium: 4 mmol/L (ref 3.5–5.2)
Sodium: 138 mmol/L (ref 134–144)
Total Protein: 8 g/dL (ref 6.0–8.5)
eGFR: 112 mL/min/{1.73_m2} (ref >59)

## 2021-05-29 LAB — CBC
Hematocrit: 36.2 % (ref 34.0–46.6)
Hemoglobin: 11.6 g/dL (ref 11.1–15.9)
MCH: 24.7 pg — ABNORMAL LOW (ref 26.6–33.0)
MCHC: 32 g/dL (ref 31.5–35.7)
MCV: 77 fL — ABNORMAL LOW (ref 79–97)
Platelets: 368 10*3/uL (ref 150–450)
RBC: 4.69 x10E6/uL (ref 3.77–5.28)
RDW: 14.9 % (ref 11.7–15.4)
WBC: 7.3 10*3/uL (ref 3.4–10.8)

## 2021-05-29 LAB — THYROID PANEL WITH TSH
Free Thyroxine Index: 1.9 (ref 1.2–4.9)
T3 Uptake Ratio: 26 % (ref 24–39)
T4, Total: 7.2 ug/dL (ref 4.5–12.0)
TSH: 2.9 u[IU]/mL (ref 0.450–4.500)

## 2021-05-29 LAB — LIPASE: Lipase: 15 U/L (ref 14–72)

## 2021-05-29 LAB — LIPID PANEL
Chol/HDL Ratio: 4.4 ratio (ref 0.0–4.4)
Cholesterol, Total: 218 mg/dL — ABNORMAL HIGH (ref 100–199)
HDL: 50 mg/dL (ref >39)
LDL Chol Calc (NIH): 148 mg/dL — ABNORMAL HIGH (ref 0–99)
Triglycerides: 111 mg/dL (ref 0–149)
VLDL Cholesterol Cal: 20 mg/dL (ref 5–40)

## 2021-05-29 LAB — VITAMIN D 25 HYDROXY (VIT D DEFICIENCY, FRACTURES): Vit D, 25-Hydroxy: 22.2 ng/mL — ABNORMAL LOW (ref 30.0–100.0)

## 2021-05-29 MED ORDER — VITAMIN D 25 MCG (1000 UNIT) PO TABS
1000.0000 [IU] | ORAL_TABLET | Freq: Every day | ORAL | 3 refills | Status: DC
  Filled 2021-05-29: qty 90, 90d supply, fill #0

## 2021-06-02 ENCOUNTER — Other Ambulatory Visit (HOSPITAL_COMMUNITY): Payer: Self-pay

## 2021-06-02 DIAGNOSIS — J209 Acute bronchitis, unspecified: Secondary | ICD-10-CM | POA: Diagnosis not present

## 2021-06-02 MED ORDER — AZITHROMYCIN 250 MG PO TABS
ORAL_TABLET | ORAL | 0 refills | Status: DC
  Filled 2021-06-02: qty 6, 5d supply, fill #0

## 2021-06-17 ENCOUNTER — Other Ambulatory Visit: Payer: Self-pay | Admitting: Medical

## 2021-06-17 ENCOUNTER — Other Ambulatory Visit (HOSPITAL_COMMUNITY): Payer: Self-pay

## 2021-06-17 DIAGNOSIS — Z136 Encounter for screening for cardiovascular disorders: Secondary | ICD-10-CM

## 2021-06-17 DIAGNOSIS — E785 Hyperlipidemia, unspecified: Secondary | ICD-10-CM

## 2021-06-17 DIAGNOSIS — R109 Unspecified abdominal pain: Secondary | ICD-10-CM

## 2021-06-17 MED ORDER — ROSUVASTATIN CALCIUM 5 MG PO TABS
5.0000 mg | ORAL_TABLET | Freq: Every day | ORAL | 3 refills | Status: DC
  Filled 2021-06-17: qty 90, 90d supply, fill #0

## 2021-06-24 ENCOUNTER — Other Ambulatory Visit (HOSPITAL_COMMUNITY): Payer: Self-pay

## 2021-06-25 ENCOUNTER — Other Ambulatory Visit (HOSPITAL_COMMUNITY): Payer: Self-pay

## 2021-07-09 ENCOUNTER — Ambulatory Visit: Payer: 59 | Admitting: Family Medicine

## 2021-07-09 ENCOUNTER — Other Ambulatory Visit: Payer: 59

## 2021-07-09 ENCOUNTER — Ambulatory Visit
Admission: RE | Admit: 2021-07-09 | Discharge: 2021-07-09 | Disposition: A | Discharge status: Home / Self Care | Payer: 59 | Source: Ambulatory Visit | Attending: Medical | Admitting: Medical

## 2021-07-09 DIAGNOSIS — R109 Unspecified abdominal pain: Secondary | ICD-10-CM | POA: Diagnosis not present

## 2021-07-09 DIAGNOSIS — R140 Abdominal distension (gaseous): Secondary | ICD-10-CM | POA: Diagnosis not present

## 2021-07-09 MED ORDER — IOPAMIDOL (ISOVUE-370) INJECTION 76%
100.0000 mL | Freq: Once | INTRAVENOUS | Status: AC | PRN
  Administered 2021-07-09: 100 mL via INTRAVENOUS

## 2021-07-14 ENCOUNTER — Other Ambulatory Visit (HOSPITAL_COMMUNITY): Payer: Self-pay

## 2021-07-29 ENCOUNTER — Ambulatory Visit
Admission: RE | Admit: 2021-07-29 | Discharge: 2021-07-29 | Disposition: A | Discharge status: Home / Self Care | Payer: No Typology Code available for payment source | Source: Ambulatory Visit | Attending: Medical | Admitting: Medical

## 2021-07-29 DIAGNOSIS — E785 Hyperlipidemia, unspecified: Secondary | ICD-10-CM

## 2021-07-29 DIAGNOSIS — Z136 Encounter for screening for cardiovascular disorders: Secondary | ICD-10-CM

## 2021-07-30 ENCOUNTER — Telehealth: Payer: Self-pay | Admitting: Medical

## 2021-07-30 NOTE — Telephone Encounter (Signed)
Left pt a VM to call the office and schedule appt 

## 2021-08-14 ENCOUNTER — Other Ambulatory Visit (HOSPITAL_COMMUNITY): Payer: Self-pay

## 2021-08-21 ENCOUNTER — Other Ambulatory Visit (HOSPITAL_COMMUNITY): Payer: Self-pay

## 2021-08-21 ENCOUNTER — Telehealth: Payer: 59 | Admitting: Family Medicine

## 2021-08-21 ENCOUNTER — Other Ambulatory Visit: Payer: Self-pay

## 2021-08-21 ENCOUNTER — Encounter: Payer: Self-pay | Admitting: Family Medicine

## 2021-08-21 ENCOUNTER — Other Ambulatory Visit: Payer: Self-pay | Admitting: *Deleted

## 2021-08-21 VITALS — Temp 96.7°F | Ht 64.0 in | Wt 189.0 lb

## 2021-08-21 DIAGNOSIS — K146 Glossodynia: Secondary | ICD-10-CM | POA: Diagnosis not present

## 2021-08-21 DIAGNOSIS — J029 Acute pharyngitis, unspecified: Secondary | ICD-10-CM | POA: Diagnosis not present

## 2021-08-21 MED ORDER — NYSTATIN 100000 UNIT/ML MT SUSP
5.0000 mL | OROMUCOSAL | 0 refills | Status: DC
  Filled 2021-08-21: qty 180, 6d supply, fill #0

## 2021-08-21 NOTE — Progress Notes (Signed)
Start time: 12:48 End time: 1:10  Virtual Visit via Video Note  I connected with Heather Maynard on 08/21/21 by a video enabled telemedicine application and verified that I am speaking with the correct person using two identifiers.  Location: Patient: home Provider: office   I discussed the limitations of evaluation and management by telemedicine and the availability of in person appointments. The patient expressed understanding and agreed to proceed.  History of Present Illness:  Chief Complaint  Patient presents with   Cough    VIRTUAL cough, ST, nasal congestion and swollen tonsils that started last Wed. Today she feels some pressure and fullness in her ears. Back of her tongue is really dry and her tastebuds look white. Back of her tongue is really sore-hurts to swallow or eat anything.    8 days ago she started with sore throat, dry cough. She noticed swollen tonsils R>L. +nasal congestion started 4-5 days ago, mild.  Nasal drainage is clear, sometimes bloody. No sinus pain/pressure.  No fever.  Some mild achiness initially. +fatigue, run down.  Throat is bothering her the most. Pain across the front of her throat, uncomfortable all the time, worse with swallowing. Worse on R at first, then the left.  Yesterday tongue was very dry, scratchy.   Base of tongue hurts.  Inflamed/white taste buds at the back of the throat.  She has a persistent dry cough. Has been hoarse x 4 days.  3 children.  Middle child started with cough/ST 2 days ago.  She is taking Tylenol Cold flu severe--taking 3x/day.  It helps some with pain enough to eat.   Hasn't taken any COVID tests +with family for holidays.  No known sick contacts  COVID vaccines x2, no boosters. Flu shot yearly.  PMH, PSH, SH reviewed BTL  Outpatient Encounter Medications as of 08/21/2021  Medication Sig Note   Dextromethorphan HBr (DELSYM PO) Take 20 mLs by mouth as needed. 08/21/2021: Last dose yesterday    levothyroxine (SYNTHROID) 50 MCG tablet TAKE 1 TABLET BY MOUTH DAILY.    omeprazole (PRILOSEC) 40 MG capsule TAKE 1 CAPSULE BY MOUTH DAILY.    Phenyleph-Diphenhyd-DM-APAP (THERAFLU SEVERE COLD & COUGH PO) Take 30 mLs by mouth as needed. 08/21/2021: Last dose last night   Phenylephrine-DM-GG-APAP (TYLENOL COLD/FLU SEVERE PO) Take 2 tablets by mouth as needed. 08/21/2021: Last dose today at 9:30am   sertraline (ZOLOFT) 50 MG tablet Take 3 tablets by mouth once daily.    [DISCONTINUED] cholecalciferol (VITAMIN D3) 25 MCG (1000 UNIT) tablet Take 1 tablet (1,000 Units total) by mouth daily.    [DISCONTINUED] rosuvastatin (CRESTOR) 5 MG tablet Take 1 tablet (5 mg total) by mouth daily.    ALPRAZolam (XANAX) 0.5 MG tablet TAKE 1 TABLET BY MOUTH AT BEDTIME AS NEEDED FOR ANXIETY. (Patient not taking: Reported on 08/21/2021) 08/21/2021: As needed   [DISCONTINUED] azithromycin (ZITHROMAX) 250 MG tablet Take 2 tablets by mouth on day 1 then 1 tablet by mouth on day 2 through 5    No facility-administered encounter medications on file as of 08/21/2021.   ROS: URI symptoms and throat/tongue complaints per HPI.  No n/v/d, no chest pain, shortness of breath.   Observations/Objective:  Temp (!) 96.7 F (35.9 C) (Tympanic)    Ht 5\' 4"  (1.626 m)    Wt 189 lb (85.7 kg)    LMP 07/30/2021    BMI 32.44 kg/m   Hoarse sounding voice.  She appears to be in discomfort with swallowing during visit. She  is alert and oriented, with grossly normal cranial nerves. Other than being hoarse, she is speaking comfortably/easily (no shortness of breath) Exam is limited due to virtual nature of the visit. No visible lymphadenopathy/swelling of anterior neck. Pt denied tenderness or lumps (other than by tonsils, tender). After the visit she sent photos of her throat/tongue. Tonsils are enlarged, slightly cryptic, but no erythema or exudate. Tongue is slightly white, but otherwise no significant pathology noted.    Assessment and  Plan:  Sore throat  Tongue pain  Supportive measures reviewed. Don't suspect bacterial etiology. Will send in rx for Magic Mouthwash (with nystatin, lidocaine, mylanta, benadryl). How to use reviewed. Encouraged her to do a COVID home test  Continue OTC medications.  Recommended she get COVID booster at some point (when better, and can delay 1-3 months if COVID test is +)   Follow Up Instructions:    I discussed the assessment and treatment plan with the patient. The patient was provided an opportunity to ask questions and all were answered. The patient agreed with the plan and demonstrated an understanding of the instructions.   The patient was advised to call back or seek an in-person evaluation if the symptoms worsen or if the condition fails to improve as anticipated.  I spent 25 minutes dedicated to the care of this patient, including pre-visit review of records, face to face time, post-visit ordering of testing and documentation.    Lavonda Jumbo, MD

## 2021-09-04 ENCOUNTER — Encounter: Payer: Self-pay | Admitting: Psychiatry

## 2021-09-04 ENCOUNTER — Other Ambulatory Visit: Payer: Self-pay

## 2021-09-04 ENCOUNTER — Other Ambulatory Visit (HOSPITAL_COMMUNITY): Payer: Self-pay

## 2021-09-04 ENCOUNTER — Ambulatory Visit (INDEPENDENT_AMBULATORY_CARE_PROVIDER_SITE_OTHER): Payer: 59 | Admitting: Psychiatry

## 2021-09-04 VITALS — BP 126/93 | HR 83 | Ht 64.0 in | Wt 194.2 lb

## 2021-09-04 DIAGNOSIS — F410 Panic disorder [episodic paroxysmal anxiety] without agoraphobia: Secondary | ICD-10-CM

## 2021-09-04 DIAGNOSIS — F411 Generalized anxiety disorder: Secondary | ICD-10-CM

## 2021-09-04 DIAGNOSIS — F4010 Social phobia, unspecified: Secondary | ICD-10-CM | POA: Diagnosis not present

## 2021-09-04 MED ORDER — SERTRALINE HCL 100 MG PO TABS
200.0000 mg | ORAL_TABLET | Freq: Every day | ORAL | 0 refills | Status: DC
  Filled 2021-09-04: qty 180, 90d supply, fill #0

## 2021-09-04 NOTE — Progress Notes (Signed)
[facility] MD/PA/NP Initial Note  09/04/2021 1:20 PM Heather Maynard  MRN:  [id number]  Chief Complaint:  Chief Complaint   Panic Attack; Anxiety     HPI: Pt is a 41 yo female being seen for initial evaluation for panic and anxiety. She reports that panic s/s will last days and she feels like she cannot breathe, no energy, and will sleep to avoid these s/s. She feels like she is dying when she has panic attacks and has called 911 twice. She will have some worry when this occurs, such as how her anxiety is affecting her relationships. She reports that panic will happen every couple of months. She is unable to identify a trigger or pattern. Has some irritability with panic. She reports that listening to podcasts for deep breathing has been helpful.  She reports "I always deal with anxiety." She reports that her anxiety started when she had her first daughter. Had first panic attack around age 63 when daughter was a few months old. She reports, "I'm definitely an over-thinker." Denies catastrophic thinking. She reports, "I try to think on the bright side." Some rumination. She reports frequent headaches. Some possible muscle tension with anxiety and butterflies in her stomach. She reports perfectionistic tendencies. She reports that she likes things in their plays.   Denies any compulsions. She reports that she has some anxiety in social situations. She will think about going out and decide to stay home. She prefers going out with someone to do shopping. Reports that she has had some social anxiety since she was a child. Will replay some conversations. Feels that she is being scrutinized at times.   She reports that her family comments that they think she has depression. She reports, "I guess I get down some days" about 2 days duration and is unsure how often it occurs.She reports feeling that she is letting people down when panic occurs. She reports, "I never have energy... I'm always tired." She  describes low motivation. She reports that she wakes up about 3 times a night and will toss and turn. She reports that she feels like she has more restful sleep when she naps. Estimates sleeping 7.5 hours at night and napping 2-3 hours a day. Has been napping more recently. Appetite is less when panic occurs. She reports diminished interest in things. Used to read and no longer does this. Denies anhedonia. "I feel like I don't have anything to look forward to." She notices sometimes she has difficulty with concentration and can be easily distracted. Denies SI- "I would never do that."   Denies excessive energy or decreased need for sleep. Denies elevated moods, impulsivity, or risky behavior. Denies AH or VH. Denies paranoia.   Born and raised in [place] and has lived in [place]. Has a fraternal twin sister, an older half-sister (that lived with her dad in [place]), and a younger brother. Twin sister is her best friend. Oldest sister lives in [place] and brother's wife is in [place] and they travel often. She reports that she was close to her mom growing up and not as much with her dad. She has been with her husband almost 20 years. She reports they had a "rough patch in 2020" and they are working through this. Has taken some classes towards associate degree. Graduated HS. Her oldest daughter turned 53 in 2020 and moved out. Has 3 daughters- almost 25 yo, 18 yo, and almost 41 yo. She works from home and reports that this  is isolating. Works for [employer] in [employer] and [employer] payments. She reports that her job is "stress free." Used to ride horses and had horses. 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 Has multiple pets. She has been doing some Bible study.   Past Psychiatric Medication Trials: Sertraline- Has taken off and on. Stopped during her pregnancies and back a few years ago. She feels that it has helped overall. "I don't panic as much." Took 200 mg in the past. Has had  discontinuation with coming off Sertraline. Xanax-Takes for severe panic Wellbutrin- Took briefly   Visit Diagnosis:    ICD-10-CM   1. Panic disorder  F41.0 sertraline (ZOLOFT) 100 MG tablet    2. Generalized anxiety disorder  F41.1     3. Social anxiety disorder  F40.10 sertraline (ZOLOFT) 100 MG tablet      Past Psychiatric History: Has seen PCP for meds in the past. She saw a therapist once several years ago through [facility]. Denies any other psychiatric treatment.   Past Medical History:  Past Medical History:  Diagnosis Date   Acid indigestion    OTC as needed   Anxiety    panic attacks   Bimalleolar ankle fracture 08/24/2013   right   Depression    Headache    Thyroid disease    Wears contact lenses     Past Surgical History:  Procedure Laterality Date   CHOLECYSTECTOMY N/A 12/19/2017   Procedure: LAPAROSCOPIC CHOLECYSTECTOMY;  Surgeon: Kinsinger, De BlanchLuke Aaron, MD;  Location: [facility];  Service: General;  Laterality: N/A;   LAPAROSCOPIC TUBAL LIGATION Bilateral 08/01/2013   Procedure: LAPAROSCOPIC TUBAL LIGATION WITH FILSHIE CLIPS;  Surgeon: Meriel Picaichard M Holland, MD;  Location: [facility];  Service: Gynecology;  Laterality: Bilateral;   ORIF ANKLE FRACTURE Right 08/29/2013   Procedure: OPEN REDUCTION INTERNAL FIXATION (ORIFRIGHT BIMALLEOLAR  ANKLE FRACTURE;  Surgeon: Sheral Apleyimothy D Murphy, MD;  Location: [facility];  Service: Orthopedics;  Laterality: Right;    Family History:  Family History  Problem Relation Age of Onset   Depression Father    Anxiety disorder Father    Hypertension Father    Diabetes Father        insulin   Heart disease Father        MI   Anxiety disorder Sister    Cancer Paternal Aunt        colon   Anxiety disorder Paternal Uncle    Heart disease Maternal Grandmother    Hypertension Maternal Grandmother    Diabetes Paternal Grandfather        insulin   Heart disease Paternal Grandmother    Hypertension Paternal Grandmother    Anxiety  disorder Daughter     Social History:  Social History   Socioeconomic History   Marital status: Married    Spouse name: Not on file   Number of children: Not on file   Years of education: Not on file   Highest education level: Not on file  Occupational History   Not on file  Tobacco Use   Smoking status: Former    Types: Cigarettes   Smokeless tobacco: Never  Substance and Sexual Activity   Alcohol use: No   Drug use: No   Sexual activity: Yes    Birth control/protection: Surgical  Other Topics Concern   Not on file  Social History Narrative   Married, 3 daughters,  walking for exercise, works at [employer], patient accounting, 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  Social Determinants of Health   Financial Resource Strain: Not on file  Food Insecurity: Not on file  Transportation Needs: Not on file  Physical Activity: Not on file  Stress: Not on file  Social Connections: Not on file    Allergies: No Known Allergies  Metabolic Disorder Labs: Lab Results  Component Value Date   HGBA1C 5.6 01/13/2019   No results found for: PROLACTIN Lab Results  Component Value Date   CHOL 218 (H) 05/28/2021   TRIG 111 05/28/2021   HDL 50 05/28/2021   CHOLHDL 4.4 05/28/2021   VLDL 15 09/28/2013   LDLCALC 148 (H) 05/28/2021   LDLCALC 159 (H) 03/04/2020   Lab Results  Component Value Date   TSH 2.900 05/28/2021   TSH 2.390 03/04/2020    Therapeutic Level Labs: No results found for: LITHIUM No results found for: VALPROATE No components found for:  CBMZ  Current Medications: Current Outpatient Medications  Medication Sig Dispense Refill   cholecalciferol (VITAMIN D3) 25 MCG (1000 UNIT) tablet Take 1,000 Units by mouth daily.     levothyroxine (SYNTHROID) 50 MCG tablet TAKE 1 TABLET BY MOUTH DAILY. 90 tablet 0   omeprazole (PRILOSEC) 40 MG capsule TAKE 1 CAPSULE BY MOUTH DAILY. 90 capsule 0   ALPRAZolam (XANAX) 0.5 MG tablet TAKE 1 TABLET BY MOUTH AT BEDTIME AS NEEDED FOR  ANXIETY. (Patient not taking: Reported on 08/21/2021) 30 tablet 2   Dextromethorphan HBr (DELSYM PO) Take 20 mLs by mouth as needed.     magic mouthwash (nystatin, lidocaine, diphenhydrAMINE, alum & mag hydroxide) suspension Swish and spit 5 mLs every 4 (four) hours. (Patient not taking: Reported on 09/04/2021) 180 mL 0   Phenyleph-Diphenhyd-DM-APAP (THERAFLU SEVERE COLD & COUGH PO) Take 30 mLs by mouth as needed.     Phenylephrine-DM-GG-APAP (TYLENOL COLD/FLU SEVERE PO) Take 2 tablets by mouth as needed.     sertraline (ZOLOFT) 100 MG tablet Take 2 tablets (200 mg total) by mouth daily. 180 tablet 0   No current facility-administered medications for this visit.    Medication Side Effects:  Possible sexual side effects  Orders placed this visit:  No orders of the defined types were placed in this encounter.   Psychiatric Specialty Exam:  Review of Systems  Constitutional:  Positive for fatigue.  HENT:  Positive for congestion.   Eyes: Negative.   Respiratory:  Positive for chest tightness.   Cardiovascular:  Positive for palpitations.  Gastrointestinal: Negative.   Endocrine: Negative.   Genitourinary: Negative.   Musculoskeletal: Negative.   Skin: Negative.   Allergic/Immunologic: Negative.   Neurological:  Positive for dizziness and headaches.  Hematological: Negative.   Psychiatric/Behavioral:         Please refer to HPI   Blood pressure (!) 126/93, pulse 83, height 5\' 4"  (1.626 m), weight 194 lb 3.2 oz (88.1 kg).Body mass index is 33.33 kg/m.  General Appearance: Neat and Well Groomed  Eye Contact:  Good  Speech:  Clear and Coherent and Normal Rate  Volume:  Normal  Mood:  Anxious and Depressed  Affect:  Appropriate, Congruent, Depressed, Full Range, and Anxious  Thought Process:  Coherent, Goal Directed, Linear, and Descriptions of Associations: Intact  Orientation:  Full (Time, Place, and Person)  Thought Content: Logical, Hallucinations: None, and Rumination    Suicidal Thoughts:  No  Homicidal Thoughts:  No  Memory:  WNL  Judgement:  Good  Insight:  Good  Psychomotor Activity:  Normal  Concentration:  Concentration: Good and Attention Span:  Good  Recall:  Good  Fund of Knowledge: Good  Language: Good  Assets:  Communication Skills Desire for Improvement Resilience Social Support  ADL's:  Intact  Cognition: WNL  Prognosis:  Good   Screenings:  PHQ2-9    Flowsheet Row Office Visit from 05/28/2021 in [facility] Visit from 03/04/2020 in [facility] Visit from 01/13/2019 in [facility] Visit from [facility]/17/2018 in [facility]  PHQ-2 Total Score 0 2 0 0  PHQ-9 Total Score 3 5 -- --       Receiving Psychotherapy: No   Treatment Plan/Recommendations: Pt seen for 60 minutes and time spent discussing anxiety s/s. Discussed potential benefits, risks, and side effects of increasing Sertraline to 200 mg po qd for anxiety and depression. Pt agrees to increase in dose. Discussed considering adding Wellbutrin XL in the future if Sertraline is helpful for anxiety but continues to experience low energy and motivation. Recommend taking Xanax 0.5 mg TID for about 3-5 days when she experiences episodes of panic since this will help stabilize acute anxiety. Discussed potential benefits of therapy and pt is amenable to starting therapy. [facility] Pt to follow-up with this provider in 6 weeks or sooner if clinically indicated. Patient advised to contact office with any questions, adverse effects, or acute worsening in signs and symptoms.     Corie ChiquitoJessica Kohlton Gilpatrick, PMHNP

## 2021-09-10 ENCOUNTER — Ambulatory Visit (INDEPENDENT_AMBULATORY_CARE_PROVIDER_SITE_OTHER): Payer: 59 | Admitting: Psychiatry

## 2021-09-10 ENCOUNTER — Other Ambulatory Visit: Payer: Self-pay

## 2021-09-10 DIAGNOSIS — F411 Generalized anxiety disorder: Secondary | ICD-10-CM

## 2021-09-10 NOTE — Progress Notes (Signed)
[facility] Counselor Initial Adult Exam  Name: Heather Maynard Date: 09/10/2021 MRN: [id number] DOB: 10/15/1980 PCP: Jac Canavan, PA-C  Time spent: 58 minutes  Guardian/Payee:    Patient  Paperwork requested:  No   Reason for Visit /Presenting Problem:  anxiety, panic, worrying  Mental Status Exam:    Appearance:   Neat     Behavior:  Appropriate, Sharing, and lower motivation past yr  Motor:  Normal  Speech/Language:   Clear and Coherent  Affect:  Anxious, "down on myself" ;depression  Mood:  anxious; depression  Thought process:  goal directed  Thought content:    Obsessions and overthinking  Sensory/Perceptual disturbances:    WNL  Orientation:  oriented to person, place, time/date, situation, day of week, month of year, year, and stated date of Feb. 1, 2023  Attention:  Good  Concentration:  Fair  Memory:  WNL  Fund of knowledge:   Good  Insight:    Good  Judgment:   Good  Impulse Control:  Good   Reported Symptoms:  see symptoms above  Risk Assessment: Danger to Self:  No Self-injurious Behavior: No Danger to Others: No Duty to Warn:no Physical Aggression / Violence:No  Access to Firearms a concern: No  Gang Involvement:No  Patient / guardian was educated about steps to take if suicide or homicide risk level increases between visits: Denies any Si. While future psychiatric events cannot be accurately predicted, the patient does not currently require acute inpatient psychiatric care and does not currently meet [facility] involuntary commitment criteria.  Substance Abuse History: Current substance abuse: No     Past Psychiatric History:   Previous psychological history is significant for anxiety and depression Outpatient Providers:[facility] History of Psych Hospitalization: No  Psychological Testing:  n/a    Abuse History: Victim of No.,  n/a    Report needed: No. Victim of Neglect:No. Perpetrator of  n/a   Witness / Exposure to Domestic Violence:  No   Protective Services Involvement: No  Witness to [employer] Violence:  No   Family History:  Family History  Problem Relation Age of Onset   Depression Father    Anxiety disorder Father    Hypertension Father    Diabetes Father        insulin   Heart disease Father        MI   Anxiety disorder Sister    Cancer Paternal Aunt        colon   Anxiety disorder Paternal Uncle    Heart disease Maternal Grandmother    Hypertension Maternal Grandmother    Diabetes Paternal Grandfather        insulin   Heart disease Paternal Grandmother    Hypertension Paternal Grandmother    Anxiety disorder Daughter     Living situation: the patient lives with their family which includes husband and 3 daughters (ages 56, 70, 68). Husband employed in business.  Sexual Orientation:  Straight  Relationship Status: married  Name of spouse / other:n/a             If a parent, number of children / ages:19, 17, 9)  Support Systems; spouse friends parents  Surveyor, quantity Stress:  No   Income/Employment/Disability: Employment  Financial planner: No   Educational History: Education: some college  Religion/Sprituality/World View:    none  Any cultural differences that may affect / interfere with treatment:  not applicable   Recreation/Hobbies: being with family and extended family  Stressors:Health problems   Marital or  family conflict   Other: issue husband and twin sister 2 yrs ago    Strengths:  Supportive Relationships, Family, Friends, Hopefulness, Journalist, newspaperelf Advocate, and Able to Communicate Effectively  Barriers:  "myself" I have a hesitation to believe in myself and get better."  Legal History: Pending legal issue / charges:  none. History of legal issue / charges:  none  Medical History/Surgical History:reviewed and patient confirms info below. Past Medical History:  Diagnosis Date   Acid indigestion    OTC as needed   Anxiety    panic attacks   Bimalleolar ankle fracture  08/24/2013   right   Depression    Headache    Thyroid disease    Wears contact lenses     Past Surgical History:  Procedure Laterality Date   CHOLECYSTECTOMY N/A 12/19/2017   Procedure: LAPAROSCOPIC CHOLECYSTECTOMY;  Surgeon: Kinsinger, De BlanchLuke Aaron, MD;  Location: [facility];  Service: General;  Laterality: N/A;   LAPAROSCOPIC TUBAL LIGATION Bilateral 08/01/2013   Procedure: LAPAROSCOPIC TUBAL LIGATION WITH FILSHIE CLIPS;  Surgeon: Meriel Picaichard M Holland, MD;  Location: [facility];  Service: Gynecology;  Laterality: Bilateral;   ORIF ANKLE FRACTURE Right 08/29/2013   Procedure: OPEN REDUCTION INTERNAL FIXATION (ORIFRIGHT BIMALLEOLAR  ANKLE FRACTURE;  Surgeon: Sheral Apleyimothy D Murphy, MD;  Location: [facility];  Service: Orthopedics;  Laterality: Right;    Medications: Patient confirms info below. Current Outpatient Medications  Medication Sig Dispense Refill   ALPRAZolam (XANAX) 0.5 MG tablet TAKE 1 TABLET BY MOUTH AT BEDTIME AS NEEDED FOR ANXIETY. (Patient not taking: Reported on 08/21/2021) 30 tablet 2   cholecalciferol (VITAMIN D3) 25 MCG (1000 UNIT) tablet Take 1,000 Units by mouth daily.     Dextromethorphan HBr (DELSYM PO) Take 20 mLs by mouth as needed.     levothyroxine (SYNTHROID) 50 MCG tablet TAKE 1 TABLET BY MOUTH DAILY. 90 tablet 0   magic mouthwash (nystatin, lidocaine, diphenhydrAMINE, alum & mag hydroxide) suspension Swish and spit 5 mLs every 4 (four) hours. (Patient not taking: Reported on 09/04/2021) 180 mL 0   omeprazole (PRILOSEC) 40 MG capsule TAKE 1 CAPSULE BY MOUTH DAILY. 90 capsule 0   Phenyleph-Diphenhyd-DM-APAP (THERAFLU SEVERE COLD & COUGH PO) Take 30 mLs by mouth as needed.     Phenylephrine-DM-GG-APAP (TYLENOL COLD/FLU SEVERE PO) Take 2 tablets by mouth as needed.     sertraline (ZOLOFT) 100 MG tablet Take 2 tablets (200 mg total) by mouth daily. 180 tablet 0   No current facility-administered medications for this visit.    No Known Allergies No known allergies  09/10/2021.  Diagnoses:    ICD-10-CM   1. Generalized anxiety disorder  F41.1      Treatment goal plan: Patient not signing treatment goal plan on computer screen due to Covid.  Treatment goals: Treatment goals remain on treatment plan as patient works with strategies to achieve her goals.  Progress is assessed each visit and documented in "subject" and/or "plan" section on treatment note.  Long-term goal: Reduce overall level, frequency, and intensity of the anxiety so that daily functioning is not impaired.  Short-term goal: Describe current and past experiences with specific and prominent worries and anxiety symptoms including their impact on functioning and attempts to resolve it.  Work with specific strategies on decreasing her worries and anxious thoughts, realizing that they are holding her back from moving forward.  Strategies: Identify, challenge, and replace the anxious/depressive self talk with positive, realistic, and empowering self talk.  Plan of Care:  This is  patient's first appointment with this therapist and we completed her initial evaluation for therapy and initial treatment goal plan.  She is a 41 year old, married for 20 yrs, 3 daughters ages 19/17/9. Reporting anxiety and panic, some depression. Sometimes "heightened anxiety to where I go into panic mode and heart beating faster and short breaths. Does use Xanax which is helpful.  Also describes herself as being an over thinker and obsessive, having low motivation at times, self doubt, some social anxiety, lack of happiness in life and states "I do not recall when I was last happy", overly stresses and worries over tasks and situations, ruminating, imagines worst case scenarios, and admits that "I am hesitant to believe in myself that I can get better".  Has had a significant issue to deal with within the family within the past couple years (not all details included in this note due to patient privacy needs).  States that  she does worry and stress a lot and that "I see the worry by being so hard on myself" and "I try to make everyone else happy and present as being upbeat even though I am doubting myself".  Patient feels that her main symptom is anxiety, followed by some depression, panic, and excessive worrying.  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  She states that she does like her job.  Her twin sister and best friend also works the same type of job within [employer] system.  In her mental status exam, appearance is neat, behavior is appropriate in sharing, lowered motivation, motor skills are normal, speech and language is clear and coherent, affect is anxious with some depression, mood is anxious with some depression, thought processes are goal-directed, thought content includes obsessions and overthinking, sensory/perceptual is WNL, well oriented to person/place/time/date/situation/day of week/month of year/year and stated date of September 10, 2021.  Her attention is good, concentration is fair, memory within normal limits, insight judgment and impulse control are all self-rated as good.  She expresses no thoughts to harm self or others.  No current substance abuse.  Past psychiatric history includes 1 visit to an [facility] counselor and she is also recently been evaluated for medication at our office.  Denies any history of abuse.  Both father and sister have struggled with anxiety previously and father has also struggled with depression.  She states that one of her daughters is also struggled with anxiety.  Reports that her spouse, friends, and parents are supportive of her.  Denies any financial stressors.  Stressors that impact her anxiety include health issues, and other interpersonal concerns.  She reports that her strengths include supportive relationships, family, friends, a sense of hopefulness, she is a good self advocate, and able to communicate  effectively.  Reports that a potential barrier to treatment might be "myself" and explains "I have a hesitation to believe in myself to get better".  For any further information that may be needed on this patient, please see the above sections of this document.  Review of initial treatment goal plan and patient is in agreement.  Next appointment within 2 weeks.  This record has been created using [employer].  Chart creation errors have been sought, but may not always have been located and corrected.  Such creation errors do not reflect on the standard of medical care provided.  Mathis Fare, LCSW

## 2021-09-14 ENCOUNTER — Other Ambulatory Visit: Payer: Self-pay | Admitting: Medical

## 2021-09-14 DIAGNOSIS — K219 Gastro-esophageal reflux disease without esophagitis: Secondary | ICD-10-CM

## 2021-09-15 ENCOUNTER — Other Ambulatory Visit (HOSPITAL_COMMUNITY): Payer: Self-pay

## 2021-09-15 ENCOUNTER — Other Ambulatory Visit: Payer: Self-pay | Admitting: Internal Medicine

## 2021-09-15 MED ORDER — OMEPRAZOLE 40 MG PO CPDR
DELAYED_RELEASE_CAPSULE | Freq: Every day | ORAL | 0 refills | Status: DC
  Filled 2021-09-15: qty 90, 90d supply, fill #0

## 2021-09-15 MED ORDER — VITAMIN D 25 MCG (1000 UNIT) PO TABS
1000.0000 [IU] | ORAL_TABLET | Freq: Every day | ORAL | 1 refills | Status: AC
  Filled 2021-09-15: qty 30, 30d supply, fill #0

## 2021-10-03 ENCOUNTER — Ambulatory Visit (INDEPENDENT_AMBULATORY_CARE_PROVIDER_SITE_OTHER): Payer: 59 | Admitting: Psychiatry

## 2021-10-03 ENCOUNTER — Other Ambulatory Visit: Payer: Self-pay

## 2021-10-03 DIAGNOSIS — F411 Generalized anxiety disorder: Secondary | ICD-10-CM | POA: Diagnosis not present

## 2021-10-03 NOTE — Progress Notes (Signed)
[facility] Counselor/Therapist Progress Note  Patient ID: Heather Maynard, MRN: [id number],    Date: 10/03/2021  Time Spent: 55 minutes   Treatment Type: Individual Therapy  Reported Symptoms: anxiety, some "depression", "not so much panic"  Mental Status Exam:  Appearance:   Casual     Behavior:  Appropriate, Sharing, and Motivated  Motor:  Normal  Speech/Language:   Clear and Coherent  Affect:  Depressed and anxious  Mood:  anxious and depressed  Thought process:  goal directed  Thought content:    Obsessions and overthinking  Sensory/Perceptual disturbances:    WNL  Orientation:  oriented to person, place, time/date, situation, day of week, month of year, year, and stated date of Feb. 24, 2023  Attention:  Good  Concentration:  Fair  Memory:  WNL  Fund of knowledge:   Good  Insight:    Good  Judgment:   Good  Impulse Control:  Good   Risk Assessment: Danger to Self:  No Self-injurious Behavior: No Danger to Others: No Duty to Warn:no Physical Aggression / Violence:No  Access to Firearms a concern: No  Gang Involvement:No   Subjective:  Patient in today reporting anxiety, some depression relating to personal, and my husband. Talked more today about confidential issue within relationship with husband, and husband's health issue. Not so much panic recently. Has a lot of worry daily. 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  Feels selfish to ask for any time for herself. Discussed healthy boundaries. Also processed a lot of her feelings/thoughts about her anxiety/panic. Overthink. Overly anxious before going anywhere. Does feel that working from home for a while has contributed to her reluctance to leave home and have anxiety/stress upon leaving home. "I feel like I'm always tired so I always make sure I include a nap in my schedule, 2-3 days a week. Don't feel as attractive  anymore (and relates this to situation earlier with husband). Feel like I'm always going ot have doubt now. Want to trust again with my husband and used more of session today to process this with patient. (Not all details included in this note due to patient privacy needs.) To work more on interruption of anxious thoughts as explained and discussed in session today.  Continues to have supportive relationships including her sister and feels that she is a good self advocate as well.  Has acknowledged that she does have a hesitation "to believe that she might get better" and this is a potential barrier to treatment which we spoke about today again.   Interventions: Solution-Oriented/Positive Psychology, Ego-Supportive, and Insight-Oriented  Treatment goal plan: Patient not signing treatment goal plan on computer screen due to Covid. Treatment goals: Treatment goals remain on treatment plan as patient works with strategies to achieve her goals.  Progress is assessed each visit and documented in "subject" and/or "plan" section on treatment note. Long-term goal: Reduce overall level, frequency, and intensity of the anxiety so that daily functioning is not impaired. Short-term goal: Describe current and past experiences with specific and prominent worries and anxiety symptoms including their impact on functioning and attempts to resolve it.  Work with specific strategies on decreasing her worries and anxious thoughts, realizing that they are holding her back from moving forward. Strategies: Identify, challenge, and replace the anxious/depressive self talk with positive, realistic, and empowering self talk.  Diagnosis:   ICD-10-CM   1. Generalized anxiety disorder  F41.1  Plan: Patient in today reporting anxiety, some depression relating to personal issues and issues with her husband.  (Not all details included in this note due to patient privacy needs.)  Patient did better today in disclosing  more information that contributes to the issues with both herself personally and in her marital relationship.  Another potential "block" for her is the way she thinks and feels about herself, "which is going on for a long time" as she tends to "not believe in herself and especially when she is trying to make changes".  Discussed this with patient today and she is to work between sessions on allowing herself to believe more in her ability to change and to actually start practicing some changes in her behavior and her thoughts. To discuss her success/lack of success in doing this at next session. Also discussed the power that her beliefs and herself carry, whether it is positive or negative.   Encouraged patient in practicing more positive behaviors including: Staying in the present focusing on what she can control or change, getting outside daily and walking, look for more positives versus negatives each day, stay in touch with people who are supportive, healthy nutrition and exercise, stop assuming negatives, practice more consistent positive self talk, use daily affirmations, stop self negating, reduce overthinking and over analyzing, challenge and counteract her self doubt, letting go of things from the past that holds her back as discussed in sessions, recognize her own positives and be able to name them, and realize the strength she shows working with goal-directed behaviors to move in a direction that supports improved emotional health.   Goal review and progress/challenges noted with patient.  Next appointment within 2 weeks.  This record has been created using [employer].  Chart creation errors have been sought, but may not always have been located and corrected.  Such creation errors do not reflect on the standard of medical care provided.   Mathis Fare, LCSW

## 2021-10-15 ENCOUNTER — Other Ambulatory Visit (HOSPITAL_COMMUNITY): Payer: Self-pay

## 2021-10-15 ENCOUNTER — Other Ambulatory Visit: Payer: Self-pay | Admitting: Medical

## 2021-10-15 MED ORDER — LEVOTHYROXINE SODIUM 50 MCG PO TABS
ORAL_TABLET | Freq: Every day | ORAL | 0 refills | Status: DC
  Filled 2021-10-15: qty 90, 90d supply, fill #0

## 2021-10-16 ENCOUNTER — Ambulatory Visit: Payer: 59 | Admitting: Psychiatry

## 2021-10-17 ENCOUNTER — Other Ambulatory Visit: Payer: Self-pay

## 2021-10-17 ENCOUNTER — Ambulatory Visit (INDEPENDENT_AMBULATORY_CARE_PROVIDER_SITE_OTHER): Payer: 59 | Admitting: Psychiatry

## 2021-10-17 DIAGNOSIS — F411 Generalized anxiety disorder: Secondary | ICD-10-CM | POA: Diagnosis not present

## 2021-10-17 NOTE — Progress Notes (Signed)
?    [facility] Counselor/Therapist Progress Note ? ?Patient ID: KIMBLERY DIOP, MRN: [id number],   ? ?Date: 10/17/2021 ? ?Time Spent: 55 minutes  ? ?Treatment Type: Individual Therapy ? ?Reported Symptoms: anxiety, overwhelmed with family, kids, finances, personal stressors ? ?Mental Status Exam: ? ?Appearance:   Casual     ?Behavior:  Appropriate, Sharing, and Motivated  ?Motor:  Normal  ?Speech/Language:   Clear and Coherent  ?Affect:  anxious  ?Mood:  anxious  ?Thought process:  goal directed  ?Thought content:    overthinking  ?Sensory/Perceptual disturbances:    WNL  ?Orientation:  oriented to person, place, time/date, situation, day of week, month of year, year, and stated date of October 17, 2021  ?Attention:  Good  ?Concentration:  Good and Fair  ?Memory:  WNL  ?Fund of knowledge:   Good  ?Insight:    Good  ?Judgment:   Good  ?Impulse Control:  Good  ? ?Risk Assessment: ?Danger to Self:  No ?Self-injurious Behavior: No ?Danger to Others: No ?Duty to Warn:no ?Physical Aggression / Violence:No  ?Access to Firearms a concern: No  ?Gang Involvement:No  ? ?Subjective:   Patient in today reporting anxiety, some depression both relating to personal and marital issues.  Continue to work today on a confidential issue within the relationship with husband and also a more recent health concerns with husband.  Panic decreased recently some. "I want to eliminate the feeling of panic about responsibilities mostly and how I manage things." Wants to be less stressed. Have dealt with it for years. Fearful of having panic attacks and can bring them on. Worked more on this today and the importance of self-talk, not making quick negative assumptions, and practicing deep breathing exercises. (Emphasis on slow breathing) More aware of her self-talk and anxious messages she has about various feelings and circumstances that tend to set her up for panic/anxiety.  Also discussed what she had shared earlier in a different session  that before she ever came for help she found it hard to "believe that she might get better" and ways to work with the doubtful/anxious thinking. ? ?Interventions: Solution-Oriented/Positive Psychology, Ego-Supportive, and Insight-Oriented ? ?Treatment goal plan: ?Patient not signing treatment goal plan on computer screen due to Covid. ?Treatment goals: ?Treatment goals remain on treatment plan as patient works with strategies to achieve her goals.  Progress is assessed each visit and documented in "subject" and/or "plan" section on treatment note. ?Long-term goal: ?Reduce overall level, frequency, and intensity of the anxiety so that daily functioning is not impaired. ?Short-term goal: ?Describe current and past experiences with specific and prominent worries and anxiety symptoms including their impact on functioning and attempts to resolve it.  Work with specific strategies on decreasing her worries and anxious thoughts, realizing that they are holding her back from moving forward. ?Strategies: ?Identify, challenge, and replace the anxious/depressive self talk with positive, realistic, and empowering self talk. ?  ?Diagnosis: ?  ICD-10-CM   ?1. Generalized anxiety disorder  F41.1   ?  ? ?Plan:  Patient in today showing motivation and active participation in session as she continued her work today more specifically focusing on her panic and anxiety, especially her panic and problematic thinking that tends to lead her towards panic.  Patient responded well to this but still shows doubts about herself and being able to make significant changes.  Does have a good attitude and actually able to laugh at herself sometimes which seems to also serve as a discharge  for some anxiety that she is feeling.  Worked well in session today and also states she wants to have some daily time at the end of her workday (3:30) to have some quiet time just for herself and we discussed a way that she might feel comfortable making this  happen.  As noted above, we also processed her thoughts about how she herself can be a potential "block" for making progress because of the way she thinks and feels about herself, which we spent more time on today and she responded well, especially in her talking about the power of her beliefs (positive versus negative) and how important that is for trying to change the way she manages her panic and anxiety.  To continue work on this between sessions. Encouraged patient in practicing more positive behaviors including: Staying in the present focusing on what she can change or control, getting outside daily and walking, look for more positives versus negatives daily, stay in touch with people who are supportive, healthy nutrition and exercise, stop assuming negatives, practice more consistent positive self talk, use daily affirmations, stop self negating, reduce overthinking and over analyzing, challenge and counteract her self doubt, letting go with things from the past that hold her back as discussed in sessions, recognize her own positives and be able to name them, and recognize the strengths she shows working with goal-directed behaviors to move in a direction that supports her improved emotional health. ? ?Goal review and progress/challenges noted with patient. ? ?Next appointment within 2 weeks. ? ?This record has been created using [employer].  Chart creation errors have been sought, but may not always have been located and corrected.  Such creation errors do not reflect on the standard of medical care provided. ? ? ?Mathis Fare, LCSW ? ? ? ? ? ? ? ? ? ? ? ? ? ? ? ? ? ? ?

## 2021-10-30 ENCOUNTER — Ambulatory Visit: Payer: 59 | Admitting: Psychiatry

## 2021-10-31 ENCOUNTER — Ambulatory Visit: Payer: 59 | Admitting: Psychiatry

## 2021-11-04 ENCOUNTER — Other Ambulatory Visit (HOSPITAL_COMMUNITY): Payer: Self-pay

## 2021-11-04 ENCOUNTER — Encounter: Payer: Self-pay | Admitting: Psychiatry

## 2021-11-04 ENCOUNTER — Ambulatory Visit (INDEPENDENT_AMBULATORY_CARE_PROVIDER_SITE_OTHER): Payer: 59 | Admitting: Psychiatry

## 2021-11-04 ENCOUNTER — Other Ambulatory Visit: Payer: Self-pay

## 2021-11-04 DIAGNOSIS — F410 Panic disorder [episodic paroxysmal anxiety] without agoraphobia: Secondary | ICD-10-CM

## 2021-11-04 DIAGNOSIS — F4010 Social phobia, unspecified: Secondary | ICD-10-CM | POA: Diagnosis not present

## 2021-11-04 DIAGNOSIS — G4700 Insomnia, unspecified: Secondary | ICD-10-CM | POA: Diagnosis not present

## 2021-11-04 MED ORDER — SERTRALINE HCL 100 MG PO TABS
200.0000 mg | ORAL_TABLET | Freq: Every day | ORAL | 0 refills | Status: DC
  Filled 2021-11-04 – 2021-12-10 (×2): qty 180, 90d supply, fill #0

## 2021-11-04 MED ORDER — ALPRAZOLAM 0.5 MG PO TABS
ORAL_TABLET | ORAL | 1 refills | Status: DC
  Filled 2021-11-04: qty 30, 7d supply, fill #0
  Filled 2022-01-29: qty 30, 7d supply, fill #1

## 2021-11-04 MED ORDER — TRAZODONE HCL 100 MG PO TABS
ORAL_TABLET | ORAL | 2 refills | Status: DC
  Filled 2021-11-04: qty 30, 30d supply, fill #0

## 2021-11-04 NOTE — Progress Notes (Signed)
Heather Maynard ?[id number] ?01-08-1981 ?41 y.o. ? ?Subjective:  ? ?Patient ID:  Heather Maynard is a 41 y.o. (DOB 01-08-1981) female. ? ?Chief Complaint:  ?Chief Complaint  ?Patient presents with  ? Sleeping Problem  ? Follow-up  ?  Depression, anxiety  ? ? ?HPI ?Heather Maynard presents to the office today for follow-up of anxiety, depression, and insomnia. "Better, I've noticed a difference." She reports that her "overall mood" has improved- "just feel better." She reports that therapy has been helpful. She reports that she is not as focused on certain things. She reports occasional panic. She reports that she had one severe episode of panic since last visit that did not last as long (2-3 days instead of a week). She reports that her anxiety has been less with being able to talk to therapist. She reports less rumination. She has some anxious thoughts about making sure she is doing what she needs to do. Occ feels over-whelmed by things. She reports that she is working on letting go of some perfectionistic tendencies. Headaches have lessened some. Continued muscle tension. Has not been out of the house recently. She went out with her sister once and was uncomfortable and thinks she would not have done this in the past.  ? ?Denies depressed mood. She reports, "I don't think I have been as sad." She reports that she had one day where she was crying throughout the day and was not sure what triggered it. Denies irritability. She reports that her energy and motivation remain low. She reports adequate concentration for work and most activities. She reports poor sleep. She reports that she will toss and turn. Has frequent middle of the night awakenings and reports that this has been her baseline for years. She has been taking Melatonin and this has helped some with her sleep. She is trying not to nap daily. Napping 1-2 days a week. Denies SI.  ? ?She reports that relationship with husband is ok. Children are doing  well. Work has been going well.  ? ?Husband has told her that she snores in her sleep. Sister has observed possible apnea. Denies restless legs.  ? ?Epworth Sleepiness Scale =13 ? ?Past Psychiatric Medication Trials: ?Sertraline- Has taken off and on. Stopped during her pregnancies and back a few years ago. She feels that it has helped overall. "I don't panic as much." Took 200 mg in the past. Has had discontinuation with coming off Sertraline. ?Xanax-Takes for severe panic ?Wellbutrin- Took briefly ? ? ?PHQ2-9   ? ?Flowsheet Row Office Visit from 05/28/2021 in [facility] Visit from 03/04/2020 in [facility] Visit from 01/13/2019 in [facility] Visit from 02/17/2018 in [facility]  ?PHQ-2 Total Score 0 2 0 0  ?PHQ-9 Total Score 3 5 -- --  ? ?  ?  ? ?Review of Systems:  ?Review of Systems  ?Gastrointestinal: Negative.   ?Musculoskeletal:  Negative for gait problem.  ?Neurological:   ?     Less headaches  ?Psychiatric/Behavioral:    ?     Please refer to HPI  ? ?Medications: I have reviewed the patient's current medications. ? ?Current Outpatient Medications  ?Medication Sig Dispense Refill  ? cholecalciferol (VITAMIN D3) 25 MCG (1000 UNIT) tablet Take 1 tablet by mouth daily. 30 tablet 1  ? levothyroxine (SYNTHROID) 50 MCG tablet TAKE 1 TABLET BY MOUTH DAILY. 90 tablet 0  ? omeprazole (PRILOSEC) 40 MG capsule TAKE 1 CAPSULE BY MOUTH  DAILY. 90 capsule 0  ? traZODone (DESYREL) 100 MG tablet Take 1/2-1 tablet by mouth at bedtime if needed for insomnia 30 tablet 2  ? [START ON 11/25/2021] ALPRAZolam (XANAX) 0.5 MG tablet Take 1/2-1 tablet by mouth every 6 hours as needed for severe anxiety and panic 30 tablet 1  ? sertraline (ZOLOFT) 100 MG tablet Take 2 tablets (200 mg total) by mouth daily. 180 tablet 0  ? ?No current facility-administered medications for this visit.  ? ? ?Medication Side Effects: None ? ?Allergies: No Known Allergies ? ?Past Medical  History:  ?Diagnosis Date  ? Acid indigestion   ? OTC as needed  ? Anxiety   ? panic attacks  ? Bimalleolar ankle fracture 08/24/2013  ? right  ? Depression   ? Headache   ? Thyroid disease   ? Wears contact lenses   ? ? ?Past Medical History, Surgical history, Social history, and Family history were reviewed and updated as appropriate.  ? ?Please see review of systems for further details on the patient's review from today.  ? ?Objective:  ? ?Physical Exam:  ?There were no vitals taken for this visit. ? ?Physical Exam ?Constitutional:   ?   General: She is not in acute distress. ?Musculoskeletal:     ?   General: No deformity.  ?Neurological:  ?   Mental Status: She is alert and oriented to person, place, and time.  ?   Coordination: Coordination normal.  ?Psychiatric:     ?   Attention and Perception: Attention and perception normal. She does not perceive auditory or visual hallucinations.     ?   Mood and Affect: Affect is not labile, blunt, angry or inappropriate.     ?   Speech: Speech normal.     ?   Behavior: Behavior normal.     ?   Thought Content: Thought content normal. Thought content is not paranoid or delusional. Thought content does not include homicidal or suicidal ideation. Thought content does not include homicidal or suicidal plan.     ?   Cognition and Memory: Cognition and memory normal.     ?   Judgment: Judgment normal.  ?   Comments: Insight intact ?Mood presents as less anxious and less depressed  ? ? ?Lab Review:  ?   ?Component Value Date/Time  ? NA 138 05/28/2021 1510  ? K 4.0 05/28/2021 1510  ? CL 101 05/28/2021 1510  ? CO2 21 05/28/2021 1510  ? GLUCOSE 89 05/28/2021 1510  ? GLUCOSE 98 06/07/2019 1415  ? BUN 6 05/28/2021 1510  ? CREATININE 0.70 05/28/2021 1510  ? CREATININE 0.65 08/19/2017 1352  ? CALCIUM 9.8 05/28/2021 1510  ? PROT 8.0 05/28/2021 1510  ? ALBUMIN 5.1 (H) 05/28/2021 1510  ? AST 13 05/28/2021 1510  ? ALT 7 05/28/2021 1510  ? ALKPHOS 82 05/28/2021 1510  ? BILITOT 0.3  05/28/2021 1510  ? GFRNONAA 115 03/04/2020 1535  ? GFRAA 132 03/04/2020 1535  ? ? ?   ?Component Value Date/Time  ? WBC 7.3 05/28/2021 1510  ? WBC 8.2 06/07/2019 1415  ? RBC 4.69 05/28/2021 1510  ? RBC 5.09 06/07/2019 1415  ? HGB 11.6 05/28/2021 1510  ? HCT 36.2 05/28/2021 1510  ? PLT 368 05/28/2021 1510  ? MCV 77 (L) 05/28/2021 1510  ? MCH 24.7 (L) 05/28/2021 1510  ? MCH 25.1 (L) 06/07/2019 1415  ? MCHC 32.0 05/28/2021 1510  ? MCHC 30.8 06/07/2019 1415  ? RDW 14.9 05/28/2021 1510  ?  LYMPHSABS 2.2 05/18/2018 1516  ? EOSABS 0.3 05/18/2018 1516  ? BASOSABS 0.1 05/18/2018 1516  ? ? ?No results found for: POCLITH, LITHIUM  ? ?No results found for: PHENYTOIN, PHENOBARB, VALPROATE, CBMZ  ? ?.res ?Assessment: Plan:   ?Pt seen for 30 minutes and time spent discussing response to increase in Sertraline and possible sleep apnea. She reports that she has been observed to snore and have episodes of apnea. Has persistent fatigue, regardless of sleep quantity. Epworth Sleepiness Score of 13. Discussed home sleep study to rule out sleep apnea as cause of sleep disturbance and chronic daytime sleepiness. Pt agrees to sleep study. Will initiate order for home sleep study.  ?Discussed potential benefits, risks, and side effects of Trazodone. Pt agrees to trial of Trazodone. Will start Trazodone 100 mg 1/2-1 tablet at bedtime as needed for insomnia.  ?Will continue Sertraline 200 mg po qd for anxiety and depression.  ?Continue Xanax prn. Discussed that Xanax could be used every 6 hours as needed for panic during periods when panic lasts a few days. ?Recommend continuing therapy with Rockne Menghini, LCSW.  ?Pt to follow-up in 6 weeks or sooner if clinically indicated.  ?Patient advised to contact office with any questions, adverse effects, or acute worsening in signs and symptoms. ? ? ?Jullisa was seen today for sleeping problem and follow-up. ? ?Diagnoses and all orders for this visit: ? ?Insomnia, unspecified type ?-     traZODone  (DESYREL) 100 MG tablet; Take 1/2-1 tablet by mouth at bedtime if needed for insomnia ? ?Panic disorder ?-     sertraline (ZOLOFT) 100 MG tablet; Take 2 tablets (200 mg total) by mouth daily. ?-     ALPRAZolam Prudy Feeler

## 2021-11-08 ENCOUNTER — Other Ambulatory Visit (HOSPITAL_COMMUNITY): Payer: Self-pay

## 2021-11-12 ENCOUNTER — Ambulatory Visit (INDEPENDENT_AMBULATORY_CARE_PROVIDER_SITE_OTHER): Payer: 59 | Admitting: Psychiatry

## 2021-11-12 DIAGNOSIS — F411 Generalized anxiety disorder: Secondary | ICD-10-CM | POA: Diagnosis not present

## 2021-11-12 NOTE — Progress Notes (Signed)
?    [facility] Counselor/Therapist Progress Note ? ?Patient ID: Heather Maynard, MRN: [id number],   ? ?Date: 11/12/2021 ? ?Time Spent: 55 minutes  ? ?Treatment Type: Individual Therapy ? ?Reported Symptoms: anxiety,some depression, tired, less energy ? ?Mental Status Exam: ? ?Appearance:   Casual     ?Behavior:  Appropriate and Sharing  ?Motor:  Normal  ?Speech/Language:   Clear and Coherent  ?Affect:  Depressed  ?Mood:  anxious  ?Thought process:  goal directed  ?Thought content:    overthinking  ?Sensory/Perceptual disturbances:    WNL  ?Orientation:  oriented to person, place, time/date, situation, day of week, month of year, year, and stated date of November 12, 2021  ?Attention:  Good  ?Concentration:  Fair  ?Memory:  WNL  ?Fund of knowledge:   Good  ?Insight:    Good and Fair  ?Judgment:   Good  ?Impulse Control:  Good  ? ?Risk Assessment: ?Danger to Self:  No ?Self-injurious Behavior: No ?Danger to Others: No ?Duty to Warn:no ?Physical Aggression / Violence:No  ?Access to Firearms a concern: No  ?Gang Involvement:No  ? ?Subjective: Patient today reporting some depression, anxiety.  States "I do feel like I am improving some gradually".  Husband over-stressed with work and feeds into some marital stress. Less panic recently. Still difficult to trust husband due to sister incident "and I've caught him lying to me previously. Doesn't feel he is currently lying to her  but also knows that she doesn't know. I know I don't feel about him like I used to feel. Husband has refused marital therapy thus far but she is encouraging him. Episodes of panic where I used to be out of breath and avoided everything, are decreasing more. Using the deep breathing techniques often when feeling more stressed "or on edge of panic" and "that has helped along with my medication ad coming in here to talk has helped." States my "situation has gotten some better but still feel challenged with husband and youngest daughter.  Reviewed some  stress reduction strategies that she might can use with her youngest daughter and also encouraged her to reach out to school counselor at daughters school to help with getting daughter back into school.  Continues working on better managing anxiety and stress and trying not to assume is going to lead into panic as it has not on several recent occasions and her panic has decreased some.  Shared how she is using some of the strategies especially in breathing exercises that are helping her.  Also working on more positive self talk as she "realizes how my own anxious messages and anxious self talk affects me  and increases the chances of panic attacks".  Still doubting herself at times but trying to believe more that she can get better as she has noticed some positive changes already. ? ?Interventions: Solution-Oriented/Positive Psychology, Ego-Supportive, and Insight-Oriented ? ?Treatment goal plan: ?Patient not signing treatment goal plan on computer screen due to Covid. ?Treatment goals: ?Treatment goals remain on treatment plan as patient works with strategies to achieve her goals.  Progress is assessed each visit and documented in "subject" and/or "plan" section on treatment note. ?Long-term goal: ?Reduce overall level, frequency, and intensity of the anxiety so that daily functioning is not impaired. ?Short-term goal: ?Describe current and past experiences with specific and prominent worries and anxiety symptoms including their impact on functioning and attempts to resolve it.  Work with specific strategies on decreasing her worries and anxious thoughts, realizing  that they are holding her back from moving forward. ?Strategies: ?Identify, challenge, and replace the anxious/depressive self talk with positive, realistic, and empowering self talk. ?  ?Diagnosis: ?  ICD-10-CM   ?1. Generalized anxiety disorder  F41.1   ?  ? ?Plan:  Patient in today showing good motivation and good participation as she continued her  work today on her panic and anxiety, relationship with husband and also her children.  She reports her panic has improved some and remains motivated to keep working with goal directed behaviors as stated above.  Still showing some self-doubt about making significant changes but the self doubt does not seem to be as strong today.  Has a good attitude and sense of humor which is helpful to her in moving forward however finds herself still hanging onto some negative memories that she has not been able to let go of just yet.  Is concerned about a health issue with husband and states she is going to encourage him more to move forward and get an appointment with Dr.  Jerilynn Som wanting to use some daily time at the end of her work day approximately 330, to have some time just for herself, although has not been able to follow through on it each day which she understands will happen occasionally due to having kids.  Following up from last session, seems to have a little more belief in herself, but still need to work on strengthening this.Encouraged patient in her practice of more positive behaviors including: Getting outside daily and walking, keeping the time at the end of her day for herself to have some "personal/private time that is uninterrupted" as much as possible, staying in the present and focus on what she can change or control, looking for more positives versus negatives daily, staying in touch with people who are supportive, healthy nutrition and exercise, stop assuming negatives, practice more consistent positive self talk, use daily affirmations, stop self negating, reduce overthinking and over analyzing, challenge and counteract her self doubt, letting go of things from the past that hold her back as discussed in sessions, recognize her own positives and be able to name them, and realize the strength she shows working with goal-directed behaviors to move in a direction that supports her improved emotional health,  her, confidence, and overall wellbeing. ? ?Goal review and progress/challenges noted with patient. ? ?Next appointment within 2 weeks. ? ?This record has been created using [employer].  Chart creation errors have been sought, but may not always have been located and corrected.  Such creation errors do not reflect on the standard of medical care provided. ? ? ?Mathis Fare, LCSW ? ? ? ? ? ? ? ? ? ? ? ? ? ? ? ? ? ? ?

## 2021-11-17 ENCOUNTER — Telehealth: Payer: Self-pay | Admitting: Psychiatry

## 2021-11-17 NOTE — Telephone Encounter (Signed)
Noted, thank you

## 2021-11-17 NOTE — Telephone Encounter (Signed)
Referral has been faxed to [facility] for a home sleep study.  Patient is aware they will contacting her. ?

## 2021-11-27 ENCOUNTER — Ambulatory Visit (INDEPENDENT_AMBULATORY_CARE_PROVIDER_SITE_OTHER): Payer: 59 | Admitting: Psychiatry

## 2021-11-27 DIAGNOSIS — F411 Generalized anxiety disorder: Secondary | ICD-10-CM | POA: Diagnosis not present

## 2021-11-27 NOTE — Progress Notes (Signed)
?    [facility] Counselor/Therapist Progress Note ? ?Patient ID: Heather Maynard, MRN: [id number],   ? ?Date: 11/27/2021 ? ?Time Spent: 55 minutes  ? ?Treatment Type: Individual Therapy ? ?Reported Symptoms: anxiety, lack of patience, less energy ? ?Mental Status Exam: ? ?Appearance:   Casual     ?Behavior:  Appropriate, Sharing, and Motivated  ?Motor:  Normal  ?Speech/Language:   Clear and Coherent  ?Affect:  Anxious, less energy, less patient  ?Mood:  anxious  ?Thought process:  goal directed  ?Thought content:    overthinking  ?Sensory/Perceptual disturbances:    WNL  ?Orientation:  oriented to person, place, time/date, situation, day of week, month of year, year, and stated date of November 27, 2021  ?Attention:  Good  ?Concentration:  Good  ?Memory:  WNL  ?Fund of knowledge:   Good  ?Insight:    Good  ?Judgment:   Good  ?Impulse Control:  Good  ? ?Risk Assessment: ?Danger to Self:  No ?Self-injurious Behavior: No ?Danger to Others: No ?Duty to Warn:no ?Physical Aggression / Violence:No  ?Access to Firearms a concern: No  ?Gang Involvement:No  ? ?Subjective: Patient today reports anxiety which can be excessive at times, depression, less patience, and less energy. States when she does get anxious she feels she is trying to manage it better, less energy.  Feels most of her symptoms are related to situation with husband and patient earlier,  and daughter's issues with anxiety more recently. Feels she is improving gradually.  Self-doubt not as strong.  Worked today more on her not being as self-critical and more self accepting.  Is also working on getting more organized herself including a visual calendar that is on the wall versus her phone.  She really liked this idea and states that she wants to make it a family calendar because they have lots of issues around schedules and people not being aware of certain things or how their schedule may impact others etc.  Did not speak as much about the issue with husband  today as she has had so much going on for herself, work, and her youngest daughter.  Youngest daughter is back in school and teachers are working with her well.  But, does want and needs to talk about that situation with husband more at a different time.  Processed her tendency to berate herself and judge herself harshly which she feels may be contributing to her excessive anxiety as well.  Worked further on her deep breathing exercises along with other stress and anxiety management skills, and some ways of helping her kids to develop some of the same skills as she is already seeing a need for that.  Patient reports that she "has gotten some better in managing my own anxiety and helping my daughter but also feel challenged and trying to make sure I get everything done and it is all good".  Did talk about the whole issue of perfection which is something she strongly identifies with and will follow up on this more next session as we ran out of time today.  She stated near the end of "I am already recognizing some good changes." ? ?Interventions: Solution-Oriented/Positive Psychology, Ego-Supportive, and Insight-Oriented ? ?Treatment goal plan: ?Patient not signing treatment goal plan on computer screen due to Covid. ?Treatment goals: ?Treatment goals remain on treatment plan as patient works with strategies to achieve her goals.  Progress is assessed each visit and documented in "subject" and/or "plan" section on treatment note. ?Long-term  goal: ?Reduce overall level, frequency, and intensity of the anxiety so that daily functioning is not impaired. ?Short-term goal: ?Describe current and past experiences with specific and prominent worries and anxiety symptoms including their impact on functioning and attempts to resolve it.  Work with specific strategies on decreasing her worries and anxious thoughts, realizing that they are holding her back from moving forward. ?Strategies: ?Identify, challenge, and replace the  anxious/depressive self talk with positive, realistic, and empowering self talk. ? ?Diagnosis: ?  ICD-10-CM   ?1. Generalized anxiety disorder  F41.1   ?  ? ? ?Plan: Patient today showing good motivation and participation as she focused more on her anxiety, stress, parent/child issues, and her own self-esteem and believing in herself more.  She did a really good job today working on these concerns and showed a lot of good motivation.  She does however find it hard to apply certain things that she learns or wants to practice, and is encouraged to continue her efforts as she is noticing progress and I see it in her as well.  Acknowledges that she has to believe more in herself and that "everything is not going to lead to anxiety or panic".  Emphasized her self talk and the types of messages she gives to herself that can easily lead to anxiety/panic and she seems to be recognizing this more.  Still doubts herself at times but not to the extreme that it was. Encouraged patient in her practice of more positive behaviors including: Staying in the present and focus on what she can change or control, getting outside daily and walking, looking for more positives versus negatives daily, staying in touch with people who are supportive, healthy nutrition and exercise, stop assuming negatives, practice more consistent positive self talk, use daily affirmations, stop self negating, reduce overthinking and over analyzing, challenge and counteract her self doubt, letting go of things from the past that hold her back as discussed in sessions, recognize her own positives and be able to name them, stop worst case scenario thinking, and recognize the strength she shows working with goal-directed behaviors to move in a direction that supports her improved emotional health and self-confidence. ? ?Review and progress/challenges noted with patient. ? ?Next appointment within 2 to 3 weeks. ? ?This record has been created using [employer].  Chart creation errors have been sought, but may not always have been located and corrected.  Such creation errors do not reflect on the standard of medical care provided. ? ? ?Mathis Fare, LCSW ? ? ? ? ? ? ? ? ? ? ? ? ? ? ? ? ? ? ?

## 2021-12-02 DIAGNOSIS — G4730 Sleep apnea, unspecified: Secondary | ICD-10-CM | POA: Diagnosis not present

## 2021-12-10 ENCOUNTER — Other Ambulatory Visit: Payer: Self-pay | Admitting: Medical

## 2021-12-10 DIAGNOSIS — K219 Gastro-esophageal reflux disease without esophagitis: Secondary | ICD-10-CM

## 2021-12-11 ENCOUNTER — Other Ambulatory Visit (HOSPITAL_COMMUNITY): Payer: Self-pay

## 2021-12-11 ENCOUNTER — Ambulatory Visit: Payer: 59 | Admitting: Psychiatry

## 2021-12-11 MED ORDER — OMEPRAZOLE 40 MG PO CPDR
DELAYED_RELEASE_CAPSULE | Freq: Every day | ORAL | 0 refills | Status: DC
  Filled 2021-12-11: qty 90, 90d supply, fill #0

## 2021-12-12 ENCOUNTER — Other Ambulatory Visit (HOSPITAL_COMMUNITY): Payer: Self-pay

## 2021-12-12 ENCOUNTER — Ambulatory Visit (INDEPENDENT_AMBULATORY_CARE_PROVIDER_SITE_OTHER): Payer: 59 | Admitting: Psychiatry

## 2021-12-12 ENCOUNTER — Encounter: Payer: Self-pay | Admitting: Psychiatry

## 2021-12-12 DIAGNOSIS — F410 Panic disorder [episodic paroxysmal anxiety] without agoraphobia: Secondary | ICD-10-CM | POA: Diagnosis not present

## 2021-12-12 DIAGNOSIS — G4700 Insomnia, unspecified: Secondary | ICD-10-CM

## 2021-12-12 DIAGNOSIS — F4010 Social phobia, unspecified: Secondary | ICD-10-CM | POA: Diagnosis not present

## 2021-12-12 MED ORDER — SERTRALINE HCL 100 MG PO TABS
200.0000 mg | ORAL_TABLET | Freq: Every day | ORAL | 0 refills | Status: DC
  Filled 2021-12-12: qty 180, 90d supply, fill #0

## 2021-12-12 MED ORDER — DOXEPIN HCL 10 MG PO CAPS
10.0000 mg | ORAL_CAPSULE | Freq: Every day | ORAL | 2 refills | Status: DC
  Filled 2021-12-12: qty 17, 17d supply, fill #0
  Filled 2021-12-15: qty 13, 13d supply, fill #0

## 2021-12-12 NOTE — Progress Notes (Signed)
Lewis Moccasin ?OR:[id number] ?[birth year] ?41 y.o. ? ?Subjective:  ? ?Patient ID:  Heather Maynard is a 41 y.o. (DOB 1981-03-04) female. ? ?Chief Complaint:  ?Chief Complaint  ?Patient presents with  ? Sleeping Problem  ? Follow-up  ?  Anxiety, depression  ? ? ?HPI ?Heather Maynard presents to the office today for follow-up of anxiety, depression, and insomnia. She reports that her mood has been "pretty good." She reports that she has had some depression "off and on, but not terrible." She reports depression typically lasts 1-2 days and occurs every 1-2 weeks. Denies any recent panic attacks. She reports that she has occasionally had an anxious feeling and that it is shorter in duration. She reports that she has had some worry and rumination. Continued perfectionistic tendencies. She avoids social situations. She went out of state to visit family and was ok with crowd of family. Continued muscle tension. Denies GI upset with anxiety. She had anxiety in response to her youngest daughter having anxiety and daughter is now feeling better. Concentration has been ok. Appetite is regular. Denies SI.  ? ?She reports that she tried Trazodone x 2 for insomnia and experienced restless legs. She completed study last week.  ? ?Not taking Xanax prn often.  ? ?Her 65 year old daughter is moving out. Her 75 yo daughter and 23 yo are at home.  ? ?Past Psychiatric Medication Trials: ?Sertraline- Has taken off and on. Stopped during her pregnancies and back a few years ago. She feels that it has helped overall. "I don't panic as much." Took 200 mg in the past. Has had discontinuation with coming off Sertraline. ?Xanax-Takes for severe panic ?Wellbutrin- Took briefly ?Trazodone- restless legs ? ?PHQ2-9   ? ?[facility] Visit from 05/28/2021 in [place] Visit from 03/04/2020 in [place] Visit from 01/13/2019 in [place] Visit from 02/17/2018 in [place]  ?PHQ-2 Total Score 0 2 0 0  ?PHQ-9 Total Score 3 5 -- --  ? ?  ?  ? ?Review of Systems:  ?Review of Systems  ?Constitutional:  Positive for fatigue.  ?Musculoskeletal:  Negative for gait problem.  ?Allergic/Immunologic: Positive for environmental allergies.  ?Neurological:  Negative for tremors.  ?Psychiatric/Behavioral:    ?     Please refer to HPI  ? ?Medications: I have reviewed the patient's current medications. ? ?Current Outpatient Medications  ?Medication Sig Dispense Refill  ? ALPRAZolam (XANAX) 0.5 MG tablet Take 1/2-1 tablet by mouth every 6 hours as needed for severe anxiety and panic 30 tablet 1  ? Biotin w/ Vitamins C & E (HAIR/SKIN/NAILS PO) Take by mouth.    ? cholecalciferol (VITAMIN D3) 25 MCG (1000 UNIT) tablet Take 1 tablet by mouth daily. 30 tablet 1  ? doxepin (SINEQUAN) 10 MG capsule Take 1 capsule (10 mg total) by mouth at bedtime. 30 capsule 2  ? levothyroxine (SYNTHROID) 50 MCG tablet TAKE 1 TABLET BY MOUTH DAILY. 90 tablet 0  ? Melatonin 5 MG LOZG Place under the tongue at bedtime as needed.    ? omeprazole (PRILOSEC) 40 MG capsule TAKE 1 CAPSULE BY MOUTH DAILY. 90 capsule 0  ? sertraline (ZOLOFT) 100 MG tablet Take 2 tablets (200 mg total) by mouth daily. 180 tablet 0  ? ?No current facility-administered medications for this visit.  ? ? ?Medication Side Effects: None ? ?Allergies: No Known Allergies ? ?Past Medical History:  ?Diagnosis Date  ? Acid indigestion   ?  OTC as needed  ? Anxiety   ? panic attacks  ? Bimalleolar ankle fracture 08/24/2013  ? right  ? Depression   ? Headache   ? Thyroid disease   ? Wears contact lenses   ? ? ?Past Medical History, Surgical history, Social history, and Family history were reviewed and updated as appropriate.  ? ?Please see review of systems for further details on the patient's review from today.  ? ?Objective:  ? ?Physical Exam:  ?There were no vitals taken for this visit. ? ?Physical Exam ?Constitutional:   ?   General: She is not in acute  distress. ?Musculoskeletal:     ?   General: No deformity.  ?Neurological:  ?   Mental Status: She is alert and oriented to person, place, and time.  ?   Coordination: Coordination normal.  ?Psychiatric:     ?   Attention and Perception: Attention and perception normal. She does not perceive auditory or visual hallucinations.     ?   Mood and Affect: Affect is not labile, blunt, angry or inappropriate.     ?   Speech: Speech normal.     ?   Behavior: Behavior normal.     ?   Thought Content: Thought content normal. Thought content is not paranoid or delusional. Thought content does not include homicidal or suicidal ideation. Thought content does not include homicidal or suicidal plan.     ?   Cognition and Memory: Cognition and memory normal.     ?   Judgment: Judgment normal.  ?   Comments: Insight intact ?Mood presents as less depressed and less anxious compared to previous 2 exams.   ? ? ?Lab Review:  ?   ?Component Value Date/Time  ? NA 138 05/28/2021 1510  ? K 4.0 05/28/2021 1510  ? CL 101 05/28/2021 1510  ? CO2 21 05/28/2021 1510  ? GLUCOSE 89 05/28/2021 1510  ? GLUCOSE 98 06/07/2019 1415  ? BUN 6 05/28/2021 1510  ? CREATININE 0.70 05/28/2021 1510  ? CREATININE 0.65 08/19/2017 1352  ? CALCIUM 9.8 05/28/2021 1510  ? PROT 8.0 05/28/2021 1510  ? ALBUMIN 5.1 (H) 05/28/2021 1510  ? AST 13 05/28/2021 1510  ? ALT 7 05/28/2021 1510  ? ALKPHOS 82 05/28/2021 1510  ? BILITOT 0.3 05/28/2021 1510  ? GFRNONAA 115 03/04/2020 1535  ? GFRAA 132 03/04/2020 1535  ? ? ?   ?Component Value Date/Time  ? WBC 7.3 05/28/2021 1510  ? WBC 8.2 06/07/2019 1415  ? RBC 4.69 05/28/2021 1510  ? RBC 5.09 06/07/2019 1415  ? HGB 11.6 05/28/2021 1510  ? HCT 36.2 05/28/2021 1510  ? PLT 368 05/28/2021 1510  ? MCV 77 (L) 05/28/2021 1510  ? MCH 24.7 (L) 05/28/2021 1510  ? MCH 25.1 (L) 06/07/2019 1415  ? MCHC 32.0 05/28/2021 1510  ? MCHC 30.8 06/07/2019 1415  ? RDW 14.9 05/28/2021 1510  ? LYMPHSABS 2.2 05/18/2018 1516  ? EOSABS 0.3 05/18/2018 1516  ?  BASOSABS 0.1 05/18/2018 1516  ? ? ?No results found for: POCLITH, LITHIUM  ? ?No results found for: PHENYTOIN, PHENOBARB, VALPROATE, CBMZ  ? ?.res ?Assessment: Plan:   ? ?Pt seen for 30 minutes and time spent discussing sleep apnea and sleep study. Discussed that results of sleep study have not yet been received and that she will be contacted when results are available. Discussed cPap would be ordered if sleep study indicates sleep apnea and need for cPap. Discussed alternatives to Trazodone to include  potential benefits, risks, and side effects of Doxepin. Pt agrees to trial of Doxepin to improve insomnia. Will start Doxepin 10 mg po QHS for insomnia.  ?Continue Sertraline 200 mg po qd for anxiety.  ?Discussed considering Wellbutrin in the future if energy does not improve. ?Recommend continuing therapy with Rinaldo Cloud, LCSW.  ?Pt to follow-up in 2 months or sooner if clinically indicated.  ?Patient advised to contact office with any questions, adverse effects, or acute worsening in signs and symptoms. ? ? ?Heather Maynard was seen today for sleeping problem and follow-up. ? ?Diagnoses and all orders for this visit: ? ?Insomnia, unspecified type ?-     doxepin (SINEQUAN) 10 MG capsule; Take 1 capsule (10 mg total) by mouth at bedtime. ? ?Panic disorder ?-     sertraline (ZOLOFT) 100 MG tablet; Take 2 tablets (200 mg total) by mouth daily. ? ?Social anxiety disorder ?-     sertraline (ZOLOFT) 100 MG tablet; Take 2 tablets (200 mg total) by mouth daily. ? ?  ? ?Please see After Visit Summary for patient specific instructions. ? ?Future Appointments  ?Date Time Provider [place]  ?01/13/2022  1:00 PM Shanon Ace, LCSW [facility] None  ?02/18/2022 11:30 AM Thayer Headings, PMHNP [facility] None  ? ? ?No orders of the defined types were placed in this encounter. ? ? ?------------------------------- ?

## 2021-12-15 ENCOUNTER — Other Ambulatory Visit (HOSPITAL_COMMUNITY): Payer: Self-pay

## 2021-12-17 ENCOUNTER — Telehealth: Payer: Self-pay | Admitting: Psychiatry

## 2021-12-17 NOTE — Telephone Encounter (Signed)
Called pt to discuss results of sleep study. Discussed that there is evidence of mild obstructive sleep apnea based on the 4% criterion for hypopnea and based on he 3% criterion, the severity would be elevated to severe OSA. Discussed that cPap may be beneficial and asked if she would like order for cPap to be sent to [facility]. She reports that she would first like to research sleep apnea and treatment options more and possibly discuss with her PCP. Will mail copy of sleep study to pt and scan into medical record. Encouraged pt to call office with any questions or if she decides she would like to move forward with process to start cPap.  ?

## 2022-01-03 DIAGNOSIS — H524 Presbyopia: Secondary | ICD-10-CM | POA: Diagnosis not present

## 2022-01-13 ENCOUNTER — Ambulatory Visit: Payer: 59 | Admitting: Psychiatry

## 2022-01-15 ENCOUNTER — Ambulatory Visit: Payer: 59 | Admitting: Psychiatry

## 2022-01-28 ENCOUNTER — Ambulatory Visit: Payer: 59 | Admitting: Medical

## 2022-01-28 ENCOUNTER — Other Ambulatory Visit (HOSPITAL_COMMUNITY): Payer: Self-pay

## 2022-01-28 ENCOUNTER — Encounter: Payer: Self-pay | Admitting: Medical

## 2022-01-28 VITALS — BP 124/80 | HR 80 | Temp 98.0°F | Wt 197.0 lb

## 2022-01-28 DIAGNOSIS — R6884 Jaw pain: Secondary | ICD-10-CM | POA: Diagnosis not present

## 2022-01-28 DIAGNOSIS — G4730 Sleep apnea, unspecified: Secondary | ICD-10-CM

## 2022-01-28 DIAGNOSIS — G4700 Insomnia, unspecified: Secondary | ICD-10-CM

## 2022-01-28 DIAGNOSIS — F324 Major depressive disorder, single episode, in partial remission: Secondary | ICD-10-CM | POA: Diagnosis not present

## 2022-01-28 DIAGNOSIS — H6982 Other specified disorders of Eustachian tube, left ear: Secondary | ICD-10-CM | POA: Diagnosis not present

## 2022-01-28 DIAGNOSIS — R5383 Other fatigue: Secondary | ICD-10-CM

## 2022-01-28 DIAGNOSIS — Z6830 Body mass index (BMI) 30.0-30.9, adult: Secondary | ICD-10-CM | POA: Diagnosis not present

## 2022-01-28 DIAGNOSIS — E039 Hypothyroidism, unspecified: Secondary | ICD-10-CM

## 2022-01-28 MED ORDER — WEGOVY 0.25 MG/0.5ML ~~LOC~~ SOAJ
0.2500 mg | SUBCUTANEOUS | 0 refills | Status: DC
  Filled 2022-01-28: qty 2, 28d supply, fill #0

## 2022-01-28 MED ORDER — AMOXICILLIN 875 MG PO TABS
875.0000 mg | ORAL_TABLET | Freq: Two times a day (BID) | ORAL | 0 refills | Status: AC
  Filled 2022-01-28: qty 20, 10d supply, fill #0

## 2022-01-28 NOTE — Progress Notes (Signed)
Subjective:  Heather Maynard is a 41 y.o. female who presents for Chief Complaint  Patient presents with   other    Started Saturday lt. Side of face was hurting in the jaw area, then ear starting hurting and feeling full, little bit light headed from  time to time,      Here for left sided jaw pain, hurts down to neck, ear feels full.   Worried about ear infection.  No fever.  No sore throat but does have some sneezing.   Mainly is jaw pain.  Has headache.  Using some ibuprofen and tylenol.   Helps a little but doesn't resolve the pain.  No pain with biting or chewing. Doesn't note facial swelling but maybe swelling over the neck on left.  No injury, trauma or fall.  No body aches or chills, but tired.  Has some post nasal drainage.  Fatigued, working full time.  Not sleeping well.  Seeing [facility] psychiatrist for sleep and depression. On some new medicaiton.  Had recent sleep study showing mild sleep apnea.      Exercise - half a mile on elliptical every other day.   Eating habits overall not bad, not a big appetite or large portions.    No other aggravating or relieving factors.    No other c/o.  Past Medical History:  Diagnosis Date   Acid indigestion    OTC as needed   Anxiety    panic attacks   Bimalleolar ankle fracture 08/24/2013   right   Depression    Headache    Thyroid disease    Wears contact lenses      Current Outpatient Medications on File Prior to Visit  Medication Sig Dispense Refill   ALPRAZolam (XANAX) 0.5 MG tablet Take 1/2-1 tablet by mouth every 6 hours as needed for severe anxiety and panic 30 tablet 1   Biotin w/ Vitamins C & E (HAIR/SKIN/NAILS PO) Take by mouth.     cholecalciferol (VITAMIN D3) 25 MCG (1000 UNIT) tablet Take 1 tablet by mouth daily. 30 tablet 1   levothyroxine (SYNTHROID) 50 MCG tablet TAKE 1 TABLET BY MOUTH DAILY. 90 tablet 0   omeprazole (PRILOSEC) 40 MG capsule TAKE 1 CAPSULE BY MOUTH DAILY. 90 capsule 0   sertraline (ZOLOFT)  100 MG tablet Take 2 tablets (200 mg total) by mouth daily. 180 tablet 0   doxepin (SINEQUAN) 10 MG capsule Take 1 capsule by mouth at bedtime. (Patient not taking: Reported on 01/28/2022) 30 capsule 2   Melatonin 5 MG LOZG Place under the tongue at bedtime as needed. (Patient not taking: Reported on 01/28/2022)     No current facility-administered medications on file prior to visit.    The following portions of the patient's history were reviewed and updated as appropriate: allergies, current medications, past family history, past medical history, past social history, past surgical history and problem list.  ROS Otherwise as in subjective above    Objective: BP 124/80   Pulse 80   Temp 98 F (36.7 C)   Wt 197 lb (89.4 kg)   BMI 33.81 kg/m   Wt Readings from Last 3 Encounters:  01/28/22 197 lb (89.4 kg)  09/04/21 194 lb 3.2 oz (88.1 kg)  08/21/21 189 lb (85.7 kg)   General appearance: alert, no distress, well developed, well nourished HEENT: normocephalic, face without swelling or tenderness, sclerae anicteric, conjunctiva pink and moist, TMs pearly, nares patent, no discharge or erythema, pharynx normal Oral cavity: MMM, no  lesions Neck: supple, tender left submandibular area with mild shoddy tender nodes, no enlarged nodes, no other mass, no thyromegaly, no masses Pulses: 2+ radial pulses, 2+ pedal pulses, normal cap refill Ext: no edema   Assessment: Encounter Diagnoses  Name Primary?   Dysfunction of left eustachian tube Yes   Jaw pain    Mild sleep apnea    Other fatigue    BMI 30.0-30.9,adult    Insomnia, unspecified type    Hypothyroidism, unspecified type    Depression, major, single episode, in partial remission (HCC)      Plan: Discussed exam and symptoms suggestive of eustachian tube dysfunction.    Patient Instructions  Symptoms and exam suggest possible eustachian tube dysfunction and/or sinus infection  No obvious sign of ear infection, TMJ  syndrome, tooth decay or other   Recommendation: Drink 80-100 ounces of water daily Begin one of the following, either Sudafed or Mucinex D or Benadryl for the next 5- 7 days If not seeing some improvement with one of the remedies above within 48 hours, consider adding antibiotic Amoxicillin   If not much improved or worse over the next week, call or recheck    Take vitamin D 1000 u daily and take a B12 supplement daily for energy boost as well.    Discussed mild sleep apnea, BMI.  She does not want to pursue CPAP.  She is willing to be more aggressive with weight loss to help with sleep apnea.   Discussed exercise, diet, increasing exercise, and discussed medication options.  She is agreeable to try [facility].  Begin trial of Wegovy. Discussed risks, benefits and proper use of medication.  F/u 93mo   Tabatha was seen today for other.  Diagnoses and all orders for this visit:  Dysfunction of left eustachian tube  Jaw pain  Mild sleep apnea  Other fatigue  BMI 30.0-30.9,adult  Insomnia, unspecified type  Hypothyroidism, unspecified type  Depression, major, single episode, in partial remission (HCC)  Other orders -     amoxicillin (AMOXIL) 875 MG tablet; Take 1 tablet (875 mg total) by mouth 2 (two) times daily for 10 days. -     Semaglutide-Weight Management (WEGOVY) 0.25 MG/0.5ML SOAJ; Inject 0.25 mg into the skin once a week.    Follow up: 20mo

## 2022-01-28 NOTE — Patient Instructions (Addendum)
Symptoms and exam suggest possible eustachian tube dysfunction and/or sinus infection  No obvious sign of ear infection, TMJ syndrome, tooth decay or other   Recommendation: Drink 80-100 ounces of water daily Begin one of the following, either Sudafed or Mucinex D or Benadryl for the next 5- 7 days If not seeing some improvement with one of the remedies above within 48 hours, consider adding antibiotic Amoxicillin   If not much improved or worse over the next week, call or recheck    Take vitamin D 1000 u daily and take a B12 supplement daily for energy boost as well.

## 2022-01-29 ENCOUNTER — Other Ambulatory Visit: Payer: Self-pay | Admitting: Medical

## 2022-01-29 ENCOUNTER — Other Ambulatory Visit (HOSPITAL_COMMUNITY): Payer: Self-pay

## 2022-01-29 MED ORDER — LEVOTHYROXINE SODIUM 50 MCG PO TABS
ORAL_TABLET | Freq: Every day | ORAL | 0 refills | Status: DC
  Filled 2022-01-29: qty 90, 90d supply, fill #0

## 2022-02-04 ENCOUNTER — Ambulatory Visit (INDEPENDENT_AMBULATORY_CARE_PROVIDER_SITE_OTHER): Payer: 59 | Admitting: Psychiatry

## 2022-02-04 DIAGNOSIS — F411 Generalized anxiety disorder: Secondary | ICD-10-CM

## 2022-02-04 NOTE — Progress Notes (Signed)
[facility] Counselor/Therapist Progress Note  Patient ID: Heather Maynard, MRN: [id number],    Date: 02/04/2022  Time Spent: 55 minutes   Treatment Type: Individual Therapy  Reported Symptoms: anxiety (increased past 3 weeks)   Mental Status Exam:  Appearance:   Casual     Behavior:  Appropriate and Sharing, Motivated  Motor:  Normal  Speech/Language:   Clear and Coherent  Affect:  anxious  Mood:  anxious  Thought process:  goal directed  Thought content:    overthinking  Sensory/Perceptual disturbances:    WNL  Orientation:  oriented to person, place, time/date, situation, day of week, month of year, year, and stated date of February 04, 2022  Attention:  Good  Concentration:  Good and Fair  Memory:  WNL  Fund of knowledge:   Good  Insight:    Good  Judgment:   Good  Impulse Control:  Good   Risk Assessment: Danger to Self:  No Self-injurious Behavior: No Danger to Others: No Duty to Warn:no Physical Aggression / Violence:No  Access to Firearms a concern: No  Gang Involvement:No   Subjective:  Patient today reporting anxiety, parent/child issues decreased, stress, all of which can impact her self-esteem at times. Anxiety heightened some recently and feels it was due to some increased stressors over a couple weeks. Worked with patient today regarding her increased anxiety and pinpointed some factors that she feels have contribute to her change in level of anxiety. Very hard for her not to think about her anxiety. Has noticed some pain in chest area when feeling anxious and she is having it checked out by doctor tomorrow, even though she feels it is related to her anxiety, but check in with doctor to be sure.  Still having better patience, and situations with daughters have calmed down.  Doing better with previous issues regarding husband.  (Not all details included in this note due to patient privacy needs).  Demonstrating less self-doubt.  Still trying to stay more  organized and using some strategies discussed earlier.  Working on trying to interrupt her worrying patterns as she notices and admits in her talking today that it is hard for her "not to worry or not to stress over things.  Even when things are calm, she may start to worry while they are calm or what might happen next etc.  Feels that the deep breathing exercises do help at times and trying to catch herself earlier in the process of her anxiety has helped on some occasions.  Shares that "I did realize I was making some good changes and need to get back to that".  Encouraged her belief in herself and for her to review what seemed to help and what did not.  Interventions: Solution-Oriented/Positive Psychology, Ego-Supportive, and Insight-Oriented  Treatment goals: Treatment goals remain on treatment plan as patient works with strategies to achieve her goals.  Progress is assessed each visit and documented in "subject" and/or "plan" section on treatment note. Long-term goal: Reduce overall level, frequency, and intensity of the anxiety so that daily functioning is not impaired. Short-term goal: Describe current and past experiences with specific and prominent worries and anxiety symptoms including their impact on functioning and attempts to resolve it.  Work with specific strategies on decreasing her worries and anxious thoughts, realizing that they are holding her back from moving forward. Strategies: Identify, challenge, and replace the anxious/depressive self talk with positive, realistic, and empowering self talk.  Diagnosis:   ICD-10-CM  1. Generalized anxiety disorder  F41.1      Plan:  Patient in today showing active participation and good motivation focusing on her anxiety, stress, parent/child issues, self-esteem, and wanting to regain some of the gains she has made but feels like they slipped away a bit.  Have not seen her in a few weeks and she is feeling that somehow she has slipped back  some in her progress.  As noted above she is having some of the chest pains checked out even though she feels they are related to her anxiety, she is now having checked out to be sure by Dr.  To continue working with anxiety management strategies as discussed in previous sessions and follow through with being checked medically to rule out any kind of physical problem that is causing the pain.  Encouraged her to believe more in herself and that she can regain progress that she had made and used those same strategies again in addition to trying to interrupt her "over thinking about possibly becoming anxious" as she shared how this can happen easily for her, and perpetuates her anxious thoughts and feelings.  Motivation is good. Encouraged patient in her practice of more positive behaviors including: Remaining in the present and focusing on what she can change or control, getting outside daily and walking, looking for more positives versus negatives each day, staying in touch with people who are supportive of her, healthy nutrition and exercise, stop assuming negatives, practice more consistent positive self talk, use daily affirmations, stop self negating, reduce overthinking and over analyzing, challenge and counteract her self doubt, letting go of things from her past that hold her back as discussed in sessions, recognize her positives and be able to name them, refrain from worst case scenario thinking, and realize the strength she shows working with goal directed behaviors to move in a direction that supports her improved emotional health.  Goal review and progress/challenges noted with patient.  Next appointment within 3 weeks.  This record has been created using [employer].  Chart creation errors have been sought, but may not always have been located and corrected.  Such creation errors do not reflect on the standard of medical care provided.   Mathis Fare,  LCSW

## 2022-02-07 ENCOUNTER — Other Ambulatory Visit (HOSPITAL_COMMUNITY): Payer: Self-pay

## 2022-02-18 ENCOUNTER — Encounter: Payer: Self-pay | Admitting: Psychiatry

## 2022-02-18 ENCOUNTER — Ambulatory Visit (INDEPENDENT_AMBULATORY_CARE_PROVIDER_SITE_OTHER): Payer: 59 | Admitting: Psychiatry

## 2022-02-18 ENCOUNTER — Other Ambulatory Visit (HOSPITAL_COMMUNITY): Payer: Self-pay

## 2022-02-18 ENCOUNTER — Other Ambulatory Visit: Payer: Self-pay | Admitting: Psychiatry

## 2022-02-18 DIAGNOSIS — F410 Panic disorder [episodic paroxysmal anxiety] without agoraphobia: Secondary | ICD-10-CM

## 2022-02-18 DIAGNOSIS — F4010 Social phobia, unspecified: Secondary | ICD-10-CM

## 2022-02-18 DIAGNOSIS — F411 Generalized anxiety disorder: Secondary | ICD-10-CM

## 2022-02-18 DIAGNOSIS — G4700 Insomnia, unspecified: Secondary | ICD-10-CM

## 2022-02-18 MED ORDER — DOXEPIN HCL 6 MG PO TABS
ORAL_TABLET | ORAL | 2 refills | Status: DC
  Filled 2022-02-18 – 2022-02-19 (×2): qty 30, 30d supply, fill #0

## 2022-02-18 MED ORDER — BUSPIRONE HCL 15 MG PO TABS
ORAL_TABLET | ORAL | 2 refills | Status: DC
  Filled 2022-02-18: qty 60, 30d supply, fill #0
  Filled 2022-04-07: qty 60, 30d supply, fill #1

## 2022-02-18 MED ORDER — SERTRALINE HCL 100 MG PO TABS
200.0000 mg | ORAL_TABLET | Freq: Every day | ORAL | 0 refills | Status: DC
  Filled 2022-02-18: qty 180, 90d supply, fill #0

## 2022-02-18 MED ORDER — ALPRAZOLAM 0.5 MG PO TABS
ORAL_TABLET | ORAL | 1 refills | Status: DC
  Filled 2022-02-18 – 2022-04-07 (×2): qty 30, 7d supply, fill #0
  Filled 2022-06-04: qty 30, 8d supply, fill #1

## 2022-02-18 NOTE — Telephone Encounter (Signed)
Please see pharmacy note.  

## 2022-02-18 NOTE — Progress Notes (Signed)
Heather Maynard [id number] 07/04/1981 41 y.o.  Subjective:   Patient ID:  Heather Maynard is a 41 y.o. (DOB 03/31/1981) female.  Chief Complaint:  Chief Complaint  Patient presents with   Anxiety   Sleeping Problem    HPI Heather Maynard presents to the office today for follow-up of anxiety, sleep disturbance, and depression. She reports that she has been dealing with some increased anxiety. She reports that she now experiences a "tingly, achy, just pain" in her chest "that feels like a heart attack." She reports feeling like HR is briefly (minutes) elevated at times and Apple watch will show her HR may increase to the 90's or low 100's from usual resting HR in the 60's. She reports that the  character of her anxiety has changed from breathing symptoms to chest pain. She reports that these symptoms last for "weeks." She is not sure what could have triggered increased anxiety, other than some things with re-organizing house and children taking different bedrooms since oldest daughter moved out. She reports some worry- "medium range, not high." Physical symptoms triggered worry about her health. Worry about developing a panic attack. Denies feeling nervous. She notices she is fidgeting more and others have noticed this. She reports that she is "a little particular about how things are done" with her work.   She denies depressed mood. She reports some feelings of frustration in terms of anxiety. She reports continued poor sleep quality. She reports that she will toss and turn. Trying not to nap after work. Reports that sleep is not restorative. Appetite has been the same. Low energy. Motivation is slightly lower. Some difficulty with concentration. Denies SI.  Has been working with therapist on breathing techniques.   She reports that she is taking Doxepin 10 mg po QHS on the weekends. She reports that she will usually fall asleep easily and wake up no more than once. She reports that it  causes excessive somnolence.  She spoke with PCP about sleep apnea since she would prefer not to start cPap if possible.   She will move her office to one of the bedrooms.   Middle child turns 36 tomorrow. Her 36 yo moved out recently. Youngest daughter is 7 years old.   Past Psychiatric Medication Trials: Sertraline- Has taken off and on. Stopped during her pregnancies and back a few years ago. She feels that it has helped overall. "I don't panic as much." Took 200 mg in the past. Has had discontinuation with coming off Sertraline. Xanax-Takes for severe panic Wellbutrin- Took briefly Trazodone- restless legs    PHQ2-9    Flowsheet Row Office Visit from 05/28/2021 in [facility] Office Visit from 03/04/2020 in [facility] Office Visit from 01/13/2019 in [facility] Office Visit from 02/17/2018 in [facility]  PHQ-2 Total Score 0 2 0 0  PHQ-9 Total Score 3 5 -- --        Review of Systems:  Review of Systems  Respiratory:  Negative for shortness of breath.   Cardiovascular:        Occ chest tightness  Musculoskeletal:  Negative for gait problem.  Neurological:  Negative for tremors.  Psychiatric/Behavioral:         Please refer to HPI    Medications: I have reviewed the patient's current medications.  Current Outpatient Medications  Medication Sig Dispense Refill   Biotin w/ Vitamins C & E (HAIR/SKIN/NAILS PO) Take by mouth.     busPIRone (BUSPAR)  15 MG tablet Take 1/3 tablet by mouth twice daily for 1 week, then take 2/3 tablet twice daily for 1 week, then take 1 tablet twice daily 60 tablet 2   cholecalciferol (VITAMIN D3) 25 MCG (1000 UNIT) tablet Take 1 tablet by mouth daily. 30 tablet 1   doxepin (SINEQUAN) 10 MG capsule Take 1 capsule by mouth at bedtime. 30 capsule 2   Doxepin HCl 6 MG TABS Take 1/2-1 tablet by mouth at bedtime as needed for insomnia 30 tablet 2   levothyroxine (SYNTHROID) 50 MCG tablet TAKE 1 TABLET  BY MOUTH DAILY. 90 tablet 0   omeprazole (PRILOSEC) 40 MG capsule TAKE 1 CAPSULE BY MOUTH DAILY. 90 capsule 0   ALPRAZolam (XANAX) 0.5 MG tablet Take 1/2-1 tablet by mouth every 6 hours as needed for severe anxiety and panic 30 tablet 1   Melatonin 5 MG LOZG Place under the tongue at bedtime as needed. (Patient not taking: Reported on 01/28/2022)     Semaglutide-Weight Management (WEGOVY) 0.25 MG/0.5ML SOAJ Inject 0.25 mg into the skin once a week. (Patient not taking: Reported on 02/18/2022) 2 mL 0   sertraline (ZOLOFT) 100 MG tablet Take 2 tablets (200 mg total) by mouth daily. 180 tablet 0   No current facility-administered medications for this visit.    Medication Side Effects: None  Allergies: No Known Allergies  Past Medical History:  Diagnosis Date   Acid indigestion    OTC as needed   Anxiety    panic attacks   Bimalleolar ankle fracture 08/24/2013   right   Depression    Headache    Thyroid disease    Wears contact lenses     Past Medical History, Surgical history, Social history, and Family history were reviewed and updated as appropriate.   Please see review of systems for further details on the patient's review from today.   Objective:   Physical Exam:  There were no vitals taken for this visit.  Physical Exam Constitutional:      General: She is not in acute distress. Musculoskeletal:        General: No deformity.  Neurological:     Mental Status: She is alert and oriented to person, place, and time.     Coordination: Coordination normal.  Psychiatric:        Attention and Perception: Attention and perception normal. She does not perceive auditory or visual hallucinations.        Mood and Affect: Mood is anxious. Mood is not depressed. Affect is not labile, blunt, angry or inappropriate.        Speech: Speech normal.        Behavior: Behavior normal.        Thought Content: Thought content normal. Thought content is not paranoid or delusional. Thought  content does not include homicidal or suicidal ideation. Thought content does not include homicidal or suicidal plan.        Cognition and Memory: Cognition and memory normal.        Judgment: Judgment normal.     Comments: Insight intact     Lab Review:     Component Value Date/Time   NA 138 05/28/2021 1510   K 4.0 05/28/2021 1510   CL 101 05/28/2021 1510   CO2 21 05/28/2021 1510   GLUCOSE 89 05/28/2021 1510   GLUCOSE 98 06/07/2019 1415   BUN 6 05/28/2021 1510   CREATININE 0.70 05/28/2021 1510   CREATININE 0.65 08/19/2017 1352   CALCIUM 9.8 05/28/2021  1510   PROT 8.0 05/28/2021 1510   ALBUMIN 5.1 (H) 05/28/2021 1510   AST 13 05/28/2021 1510   ALT 7 05/28/2021 1510   ALKPHOS 82 05/28/2021 1510   BILITOT 0.3 05/28/2021 1510   GFRNONAA 115 03/04/2020 1535   GFRAA 132 03/04/2020 1535       Component Value Date/Time   WBC 7.3 05/28/2021 1510   WBC 8.2 06/07/2019 1415   RBC 4.69 05/28/2021 1510   RBC 5.09 06/07/2019 1415   HGB 11.6 05/28/2021 1510   HCT 36.2 05/28/2021 1510   PLT 368 05/28/2021 1510   MCV 77 (L) 05/28/2021 1510   MCH 24.7 (L) 05/28/2021 1510   MCH 25.1 (L) 06/07/2019 1415   MCHC 32.0 05/28/2021 1510   MCHC 30.8 06/07/2019 1415   RDW 14.9 05/28/2021 1510   LYMPHSABS 2.2 05/18/2018 1516   EOSABS 0.3 05/18/2018 1516   BASOSABS 0.1 05/18/2018 1516    No results found for: "POCLITH", "LITHIUM"   No results found for: "PHENYTOIN", "PHENOBARB", "VALPROATE", "CBMZ"   .res Assessment: Plan:    Patient seen for 30 minutes and time spent discussing new manifestation of panic and anxiety.  She reports that she continues to experience some mild generalized anxiety as well. Discussed potential benefits, risks, and side effects of BuSpar.  Patient agrees to trial of BuSpar.  Will start BuSpar 15 mg 1/3 tablet twice daily for 1 week, then increase to 2/3 tablet twice daily for 1 week, then increase to 1 tablet twice daily for anxiety. Discussed that doxepin is  available in tablet form at lower doses since she reports doxepin 10 mg is effective for her insomnia, however it causes excessive daytime somnolence and she is only able to take it on the weekends.  Patient reports that she would be interested in trying a low dose to help with insomnia through the week.  Will start doxepin 6 mg 1/2 to 1 tablet at bedtime as needed for insomnia. Will continue sertraline 200 mg daily for anxiety and depression. Will continue Xanax 0.5 mg 1/2-1 tab po q 6 hours prn anxiety and panic.  Recommend continuing therapy with Rockne Menghini, LCSW.  Pt to follow-up with this provider in 6-8 weeks or sooner if clinically indicated.  Patient advised to contact office with any questions, adverse effects, or acute worsening in signs and symptoms.   Suni was seen today for anxiety and sleeping problem.  Diagnoses and all orders for this visit:  Generalized anxiety disorder -     busPIRone (BUSPAR) 15 MG tablet; Take 1/3 tablet by mouth twice daily for 1 week, then take 2/3 tablet twice daily for 1 week, then take 1 tablet twice daily  Panic disorder -     busPIRone (BUSPAR) 15 MG tablet; Take 1/3 tablet by mouth twice daily for 1 week, then take 2/3 tablet twice daily for 1 week, then take 1 tablet twice daily -     sertraline (ZOLOFT) 100 MG tablet; Take 2 tablets (200 mg total) by mouth daily. -     ALPRAZolam (XANAX) 0.5 MG tablet; Take 1/2-1 tablet by mouth every 6 hours as needed for severe anxiety and panic  Social anxiety disorder -     busPIRone (BUSPAR) 15 MG tablet; Take 1/3 tablet by mouth twice daily for 1 week, then take 2/3 tablet twice daily for 1 week, then take 1 tablet twice daily -     sertraline (ZOLOFT) 100 MG tablet; Take 2 tablets (200 mg total) by  mouth daily.  Insomnia, unspecified type -     Doxepin HCl 6 MG TABS; Take 1/2-1 tablet by mouth at bedtime as needed for insomnia     Please see After Visit Summary for patient specific  instructions.  Future Appointments  Date Time Provider Department Center  02/26/2022  1:00 PM Mathis Fare, LCSW [facility] None  04/16/2022  8:30 AM Corie Chiquito, PMHNP [facility] None    No orders of the defined types were placed in this encounter.   -------------------------------

## 2022-02-19 ENCOUNTER — Other Ambulatory Visit (HOSPITAL_COMMUNITY): Payer: Self-pay

## 2022-02-20 ENCOUNTER — Other Ambulatory Visit (HOSPITAL_COMMUNITY): Payer: Self-pay

## 2022-02-21 ENCOUNTER — Other Ambulatory Visit (HOSPITAL_COMMUNITY): Payer: Self-pay

## 2022-02-23 ENCOUNTER — Other Ambulatory Visit (HOSPITAL_COMMUNITY): Payer: Self-pay

## 2022-02-26 ENCOUNTER — Ambulatory Visit (INDEPENDENT_AMBULATORY_CARE_PROVIDER_SITE_OTHER): Payer: 59 | Admitting: Psychiatry

## 2022-02-26 DIAGNOSIS — F411 Generalized anxiety disorder: Secondary | ICD-10-CM

## 2022-02-26 NOTE — Progress Notes (Signed)
[facility] Counselor/Therapist Progress Note  Patient ID: Heather Maynard, MRN: [id number],    Date: 02/26/2022  Time Spent: 55 minutes   Treatment Type: Individual Therapy  Reported Symptoms: anxiety, "hard to concentrate and focus"  started about 2 wks ago and stated she seemed to start feeling this around the time of starting Doxepin 10mg . Also feeling groggy in a.m. after taking this. States med provider may decrease her dosage to 6mg .  Mental Status Exam:  Appearance:   Casual     Behavior:  Appropriate, Sharing, and Motivated  Motor:  Normal  Speech/Language:   Clear and Coherent  Affect:  anxious  Mood:  anxious  Thought process:  goal directed  Thought content:    overthinking  Sensory/Perceptual disturbances:    WNL  Orientation:  oriented to person, place, time/date, situation, day of week, month of year, year, and stated date of February 26, 2022  Attention:  Fair  Concentration:  Fair  Memory:  WNL  Fund of knowledge:   Good  Insight:    Good and Fair  Judgment:   Good  Impulse Control:  Good and Fair   Risk Assessment: Danger to Self:  No Self-injurious Behavior: No Danger to Others: No Duty to Warn:no Physical Aggression / Violence:No  Access to Firearms a concern: No  Gang Involvement:No   Subjective:  Patient in today reporting anxiety, hard to concentrate and focus at times, still practicing deep breathing exercises with some benefit.Doesn't feel her anxiety is quite as bad more recently, but the "hard to concentrate and focus" is newer concern.  Trying not to over focus or stress on this "in ways that might make it worse".  She plans to speak with her medication provider about this issue as well thinking it may be medication related.  Processing some family issues that can also be stressful but "communication with them has improved." Feels better in her communication skills especially in her listening to daughters.  Does not seem quite as "stuck" in her  anxiety today.  And no mention of any chest pain today or any time since her last appointment.  Discussed more of a family-related incident occurring in Sept. 2020. (Not all details included in this note due to patient privacy needs.) Patient responded well and seemed appreciative, especially in talking about certain relationship dynamics she wants to change especially into the future.  Reports still working with some breathing exercises and feels that it is helpful when she can be consistent, and also recognizing how she needs to work more at her consistency in order to make and keep her progress.  Pleasant and smiling often today.  Relationship with husband seems better.  Again, encouraged patient's belief in herself and trying to make goal direct changes going forward.  Interventions: Cognitive Behavioral Therapy  Treatment goals: Treatment goals remain on treatment plan as patient works with strategies to achieve her goals.  Progress is assessed each visit and documented in "subject" and/or "plan" section on treatment note. Long-term goal: Reduce overall level, frequency, and intensity of the anxiety so that daily functioning is not impaired. Short-term goal: Describe current and past experiences with specific and prominent worries and anxiety symptoms including their impact on functioning and attempts to resolve it.  Work with specific strategies on decreasing her worries and anxious thoughts, realizing that they are holding her back from moving forward. Strategies: Identify, challenge, and replace the anxious/depressive self talk with positive, realistic, and empowering self talk.  Diagnosis:   ICD-10-CM   1. Generalized anxiety disorder  F41.1      Plan: Patient in today showing good motivation and active participation working more on her anxiety and trying to make some changes within family relationship and ways of looking at certain dynamics and things in her past.  (Not all details  included in this note due to patient privacy needs.)  Does state that she has had a new worry about "she is finding it harder to concentrate and focus at times" and plans to speak with her medication provider about this.  Acknowledges that it could be in part related to her anxiety as she does worry about things often although reports some progress more recently.  Overall, does not seem quite as stuck in her anxiety and reporting some progress today.  Trying to become more consistent in her goal directed behaviors, hopefully leading to more progress for her.  Mood seemed good today as evidenced by her pleasant attitude and smiling appropriately.  Working more on some family relationships and ways she is wanting to do things differently and be able to experience family time together with more positive feelings.  Is working on interrupting her overthinking as she tends to think about "possibly becoming anxious in situations" and trying to lessen these thoughts.  Encouraged patient to feel good about some progress she feels she is making and the efforts she is putting forth. Encouraged patient in her practice of more positive behaviors including: Staying in the present and focusing on what she can control or change, getting outside daily and walking, healthy nutrition and exercise, looking for more positives versus negatives each day, staying in touch with people who are supportive of her, stop assuming negatives or worst case scenarios, use of daily affirmations, positive self talk, reduce overthinking and over analyzing, challenge and counteract her self doubt, letting go of things from her past that hold her back, recognize her positives and be able to name them, and recognize the strength she shows working with goal directed behaviors to move in a direction that supports her improved emotional health and self-confidence.  Goal review and progress/challenges noted with patient.  Next appointment within 2 to 3  weeks.  This record has been created using [employer].  Chart creation errors have been sought, but may not always have been located and corrected.  Such creation errors do not reflect on the standard of medical care provided.   Mathis Fare, LCSW

## 2022-02-27 DIAGNOSIS — R7303 Prediabetes: Secondary | ICD-10-CM | POA: Diagnosis not present

## 2022-02-27 DIAGNOSIS — Z0000 Encounter for general adult medical examination without abnormal findings: Secondary | ICD-10-CM | POA: Diagnosis not present

## 2022-03-04 ENCOUNTER — Other Ambulatory Visit (HOSPITAL_COMMUNITY): Payer: Self-pay

## 2022-03-13 ENCOUNTER — Other Ambulatory Visit (HOSPITAL_COMMUNITY): Payer: Self-pay

## 2022-03-20 ENCOUNTER — Ambulatory Visit: Payer: 59 | Admitting: Psychiatry

## 2022-03-23 ENCOUNTER — Telehealth: Payer: Self-pay | Admitting: Psychiatry

## 2022-03-23 NOTE — Telephone Encounter (Signed)
Pt has been taking med for a month and reports side effects above.She said when she first started before she increased to 2 she did fine and it has helped anxiety.She wants to know if she can reduce dose to 1/2 tab BID

## 2022-03-23 NOTE — Telephone Encounter (Signed)
LVM to rtc 

## 2022-03-23 NOTE — Telephone Encounter (Signed)
Next visit is 04/16/22. Heather Maynard is having side effects from her Buspar. They are, dizziness, light headedness, nausea and feels out of sort. Could some please call her at [phone]. Pharmacy is:  [facility]  Phone:  [phone]  Fax:  [phone]

## 2022-03-23 NOTE — Telephone Encounter (Signed)
Pt informed

## 2022-04-07 ENCOUNTER — Other Ambulatory Visit: Payer: Self-pay | Admitting: Medical

## 2022-04-07 ENCOUNTER — Other Ambulatory Visit (HOSPITAL_COMMUNITY): Payer: Self-pay

## 2022-04-07 DIAGNOSIS — K219 Gastro-esophageal reflux disease without esophagitis: Secondary | ICD-10-CM

## 2022-04-07 MED ORDER — OMEPRAZOLE 40 MG PO CPDR
40.0000 mg | DELAYED_RELEASE_CAPSULE | Freq: Every day | ORAL | 0 refills | Status: DC
  Filled 2022-04-07 – 2022-07-22 (×2): qty 30, 30d supply, fill #0

## 2022-04-10 ENCOUNTER — Ambulatory Visit: Payer: 59 | Admitting: Psychiatry

## 2022-04-15 ENCOUNTER — Other Ambulatory Visit (HOSPITAL_COMMUNITY): Payer: Self-pay

## 2022-04-15 ENCOUNTER — Encounter: Payer: Self-pay | Admitting: Internal Medicine

## 2022-04-16 ENCOUNTER — Other Ambulatory Visit (HOSPITAL_COMMUNITY): Payer: Self-pay

## 2022-04-16 ENCOUNTER — Encounter: Payer: Self-pay | Admitting: Psychiatry

## 2022-04-16 ENCOUNTER — Ambulatory Visit (INDEPENDENT_AMBULATORY_CARE_PROVIDER_SITE_OTHER): Payer: 59 | Admitting: Psychiatry

## 2022-04-16 DIAGNOSIS — F410 Panic disorder [episodic paroxysmal anxiety] without agoraphobia: Secondary | ICD-10-CM | POA: Diagnosis not present

## 2022-04-16 DIAGNOSIS — F411 Generalized anxiety disorder: Secondary | ICD-10-CM | POA: Diagnosis not present

## 2022-04-16 DIAGNOSIS — G4700 Insomnia, unspecified: Secondary | ICD-10-CM

## 2022-04-16 DIAGNOSIS — F4010 Social phobia, unspecified: Secondary | ICD-10-CM | POA: Diagnosis not present

## 2022-04-16 MED ORDER — BUSPIRONE HCL 15 MG PO TABS
ORAL_TABLET | ORAL | 2 refills | Status: DC
  Filled 2022-04-16: qty 60, fill #0
  Filled 2022-05-19: qty 60, 45d supply, fill #0
  Filled 2022-07-07: qty 60, 45d supply, fill #1
  Filled 2022-08-31: qty 60, 45d supply, fill #2

## 2022-04-16 MED ORDER — DOXEPIN HCL 6 MG PO TABS
3.0000 mg | ORAL_TABLET | Freq: Every evening | ORAL | 2 refills | Status: DC | PRN
  Filled 2022-04-16 – 2022-08-31 (×2): qty 30, 30d supply, fill #0

## 2022-04-16 MED ORDER — SERTRALINE HCL 100 MG PO TABS
200.0000 mg | ORAL_TABLET | Freq: Every day | ORAL | 0 refills | Status: DC
  Filled 2022-04-16 – 2022-06-04 (×2): qty 180, 90d supply, fill #0

## 2022-04-16 NOTE — Progress Notes (Signed)
[name] [id number] Mar 14, 1981 41 y.o.  Subjective:   Patient ID:  Heather Maynard is a 41 y.o. (DOB 1980/11/22) female.  Chief Complaint:  Chief Complaint  Patient presents with   Insomnia   Follow-up    Anxiety    HPI Heather Maynard presents to the office today for follow-up of anxiety, depression, and insomnia. She reports that she had side effects with Buspar 15 mg po BID with dizziness, nausea, and cognitively slower. She has been taking 1/2 tab twice daily and tolerating this. She does not recall tolerability issues with 10 mg BID. She reports that anxiety has improved. She has not had any had any recent issues with chest pain, panic, and severe anxiety. Notices preparing for big events (holidays, vacations) can trigger anxiety. She reports that it is helpful to make lists and planning in advance. She reports that she is "very forgetful." She reports that concentration has been "getting worse" over time and denies history of impaired concentration. She reports that worry and generalized anxiety have improved and now manageable. Denies depressed mood. Motivation has been "horrible." She has been trying to break tasks down into smaller steps. Energy is low and "forcing" herself to be productive. Appetite has been unchanged. Denies SI.   She reports that she has difficulty falling and staying asleep. Doxepin 10 mg capsule is effective, however it causes excessive daytime somnolence and is unable to take it through the week before work due to side effects. Plans to talk with dentist about tx for sleep apnea.   Has not needed Xanax prn recently.   Past Psychiatric Medication Trials: Sertraline- Has taken off and on. Stopped during her pregnancies and back a few years ago. She feels that it has helped overall. "I don't panic as much." Took 200 mg in the past. Has had discontinuation with coming off Sertraline. Xanax-Takes for severe panic Wellbutrin- Took briefly Trazodone-  restless legs Doxepin-Excessive daytime somnolence with 10 mg  PHQ2-9    Flowsheet Row Office Visit from 05/28/2021 in [facility] Office Visit from 03/04/2020 in [facility] Office Visit from 01/13/2019 in [facility] Office Visit from 02/17/2018 in [facility]  PHQ-2 Total Score 0 2 0 0  PHQ-9 Total Score 3 5 -- --        Review of Systems:  Review of Systems  Respiratory: Negative.    Cardiovascular: Negative.   Musculoskeletal:  Negative for gait problem.  Neurological:  Negative for tremors.  Psychiatric/Behavioral:         Please refer to HPI    Medications: I have reviewed the patient's current medications.  Current Outpatient Medications  Medication Sig Dispense Refill   Biotin w/ Vitamins C & E (HAIR/SKIN/NAILS PO) Take by mouth.     cholecalciferol (VITAMIN D3) 25 MCG (1000 UNIT) tablet Take 1 tablet by mouth daily. 30 tablet 1   doxepin (SINEQUAN) 10 MG capsule Take 1 capsule by mouth at bedtime. 30 capsule 2   levothyroxine (SYNTHROID) 50 MCG tablet TAKE 1 TABLET BY MOUTH DAILY. 90 tablet 0   omeprazole (PRILOSEC) 40 MG capsule TAKE 1 CAPSULE BY MOUTH DAILY. 30 capsule 0   ALPRAZolam (XANAX) 0.5 MG tablet Take 1/2 - 1 tablet by mouth every 6 hours as needed for severe anxiety and panic (Patient not taking: Reported on 04/16/2022) 30 tablet 1   busPIRone (BUSPAR) 15 MG tablet Take 7.5-10 mg twice daily 60 tablet 2   Doxepin HCl 6 MG TABS  Take 0.5-1 tablets (3-6 mg total) by mouth at bedtime as needed for insomnia 30 tablet 2   Melatonin 5 MG LOZG Place under the tongue at bedtime as needed. (Patient not taking: Reported on 01/28/2022)     Semaglutide-Weight Management (WEGOVY) 0.25 MG/0.5ML SOAJ Inject 0.25 mg into the skin once a week. (Patient not taking: Reported on 02/18/2022) 2 mL 0   sertraline (ZOLOFT) 100 MG tablet Take 2 tablets (200 mg total) by mouth daily. 180 tablet 0   No current facility-administered  medications for this visit.    Medication Side Effects: None  Allergies: No Known Allergies  Past Medical History:  Diagnosis Date   Acid indigestion    OTC as needed   Anxiety    panic attacks   Bimalleolar ankle fracture 08/24/2013   right   Depression    Headache    Thyroid disease    Wears contact lenses     Past Medical History, Surgical history, Social history, and Family history were reviewed and updated as appropriate.   Please see review of systems for further details on the patient's review from today.   Objective:   Physical Exam:  There were no vitals taken for this visit.  Physical Exam Constitutional:      General: She is not in acute distress. Musculoskeletal:        General: No deformity.  Neurological:     Mental Status: She is alert and oriented to person, place, and time.     Coordination: Coordination normal.  Psychiatric:        Attention and Perception: Attention and perception normal. She does not perceive auditory or visual hallucinations.        Mood and Affect: Mood normal. Mood is not anxious or depressed. Affect is not labile, blunt, angry or inappropriate.        Speech: Speech normal.        Behavior: Behavior normal.        Thought Content: Thought content normal. Thought content is not paranoid or delusional. Thought content does not include homicidal or suicidal ideation. Thought content does not include homicidal or suicidal plan.        Cognition and Memory: Cognition and memory normal.        Judgment: Judgment normal.     Comments: Insight intact     Lab Review:     Component Value Date/Time   NA 138 05/28/2021 1510   K 4.0 05/28/2021 1510   CL 101 05/28/2021 1510   CO2 21 05/28/2021 1510   GLUCOSE 89 05/28/2021 1510   GLUCOSE 98 06/07/2019 1415   BUN 6 05/28/2021 1510   CREATININE 0.70 05/28/2021 1510   CREATININE 0.65 08/19/2017 1352   CALCIUM 9.8 05/28/2021 1510   PROT 8.0 05/28/2021 1510   ALBUMIN 5.1 (H)  05/28/2021 1510   AST 13 05/28/2021 1510   ALT 7 05/28/2021 1510   ALKPHOS 82 05/28/2021 1510   BILITOT 0.3 05/28/2021 1510   GFRNONAA 115 03/04/2020 1535   GFRAA 132 03/04/2020 1535       Component Value Date/Time   WBC 7.3 05/28/2021 1510   WBC 8.2 06/07/2019 1415   RBC 4.69 05/28/2021 1510   RBC 5.09 06/07/2019 1415   HGB 11.6 05/28/2021 1510   HCT 36.2 05/28/2021 1510   PLT 368 05/28/2021 1510   MCV 77 (L) 05/28/2021 1510   MCH 24.7 (L) 05/28/2021 1510   MCH 25.1 (L) 06/07/2019 1415   MCHC 32.0 05/28/2021  1510   MCHC 30.8 06/07/2019 1415   RDW 14.9 05/28/2021 1510   LYMPHSABS 2.2 05/18/2018 1516   EOSABS 0.3 05/18/2018 1516   BASOSABS 0.1 05/18/2018 1516    No results found for: "POCLITH", "LITHIUM"   No results found for: "PHENYTOIN", "PHENOBARB", "VALPROATE", "CBMZ"   .res Assessment: Plan:   Pt seen for 30 minutes and time spent discussing insomnia. She reports that Doxepin 10 mg capsule is effective, however it causes excessive daytime somnolence and she is unable to take it through the week before work due to side effects. She reports that she was not able to fill Doxepin 6 mg tabs due to insurance. Will re-send script with request for pharmacy to send PA request if needed for the 6 mg tabs.  Discussed considering hydroxyzine if Doxepin 6 mg tabs is not covered by insurance.  She reports that anxiety has significantly improved with Buspar 7.5 mg po BID and that she was unable to tolerate 15 mg BID. She does not recall any side effects with 10 mg BID. She reports that she may want to try 10 mg BID if she has some increased anxiety around the holidays. Will therefore send script for Buspar 7.5 mg- 10 mg BID for anxiety.  Continue Sertraline 200 mg po qd for anxiety and depression.  Continue Alprazolam 0.5 mg as needed for panic. She reports that she has not taken Xanax prn recently and does not need a script at this time.  Recommend continuing therapy with Rockne Menghini,  LCSW.  Pt to follow-up in 3 months or sooner if clinically indicated.  Patient advised to contact office with any questions, adverse effects, or acute worsening in signs and symptoms.   Hertha was seen today for insomnia and follow-up.  Diagnoses and all orders for this visit:  Generalized anxiety disorder -     busPIRone (BUSPAR) 15 MG tablet; Take 7.5-10 mg twice daily  Insomnia, unspecified type -     Doxepin HCl 6 MG TABS; Take 0.5-1 tablets (3-6 mg total) by mouth at bedtime as needed for insomnia  Panic disorder -     busPIRone (BUSPAR) 15 MG tablet; Take 7.5-10 mg twice daily -     sertraline (ZOLOFT) 100 MG tablet; Take 2 tablets (200 mg total) by mouth daily.  Social anxiety disorder -     busPIRone (BUSPAR) 15 MG tablet; Take 7.5-10 mg twice daily -     sertraline (ZOLOFT) 100 MG tablet; Take 2 tablets (200 mg total) by mouth daily.     Please see After Visit Summary for patient specific instructions.  Future Appointments  Date Time Provider Department Center  05/08/2022  9:00 AM Mathis Fare, LCSW [facility] None  07/16/2022 10:30 AM Corie Chiquito, PMHNP [facility] None    No orders of the defined types were placed in this encounter.   -------------------------------

## 2022-04-28 ENCOUNTER — Other Ambulatory Visit (HOSPITAL_COMMUNITY): Payer: Self-pay

## 2022-04-28 ENCOUNTER — Other Ambulatory Visit: Payer: Self-pay | Admitting: Psychiatry

## 2022-04-28 DIAGNOSIS — G4700 Insomnia, unspecified: Secondary | ICD-10-CM

## 2022-04-29 ENCOUNTER — Other Ambulatory Visit (HOSPITAL_COMMUNITY): Payer: Self-pay

## 2022-05-01 ENCOUNTER — Ambulatory Visit: Payer: 59 | Admitting: Psychiatry

## 2022-05-04 ENCOUNTER — Other Ambulatory Visit (HOSPITAL_COMMUNITY): Payer: Self-pay

## 2022-05-04 ENCOUNTER — Telehealth (INDEPENDENT_AMBULATORY_CARE_PROVIDER_SITE_OTHER): Payer: 59 | Admitting: Medical

## 2022-05-04 ENCOUNTER — Other Ambulatory Visit: Payer: Self-pay | Admitting: Medical

## 2022-05-04 ENCOUNTER — Other Ambulatory Visit (INDEPENDENT_AMBULATORY_CARE_PROVIDER_SITE_OTHER): Payer: 59

## 2022-05-04 VITALS — HR 96 | Wt 195.0 lb

## 2022-05-04 DIAGNOSIS — R300 Dysuria: Secondary | ICD-10-CM

## 2022-05-04 DIAGNOSIS — M549 Dorsalgia, unspecified: Secondary | ICD-10-CM

## 2022-05-04 LAB — POCT URINALYSIS DIP (PROADVANTAGE DEVICE)
Bilirubin, UA: NEGATIVE
Blood, UA: NEGATIVE
Glucose, UA: NEGATIVE mg/dL
Ketones, POC UA: NEGATIVE mg/dL
Leukocytes, UA: NEGATIVE
Nitrite, UA: NEGATIVE
Protein Ur, POC: NEGATIVE mg/dL
Specific Gravity, Urine: 1.005
Urobilinogen, Ur: NEGATIVE
pH, UA: 7 (ref 5.0–8.0)

## 2022-05-04 MED ORDER — SULFAMETHOXAZOLE-TRIMETHOPRIM 800-160 MG PO TABS
1.0000 | ORAL_TABLET | Freq: Two times a day (BID) | ORAL | 0 refills | Status: DC
Start: 2022-05-04 — End: 2022-10-01
  Filled 2022-05-04: qty 14, 7d supply, fill #0

## 2022-05-04 NOTE — Progress Notes (Signed)
Subjective:     Patient ID: Heather Maynard, female   DOB: Feb 18, 1981, 41 y.o.   MRN: [id number]  This visit type was conducted due to national recommendations for restrictions regarding the COVID-19 Pandemic (e.g. social distancing) in an effort to limit this patient's exposure and mitigate transmission in our community.  Due to their co-morbid illnesses, this patient is at least at moderate risk for complications without adequate follow up.  This format is felt to be most appropriate for this patient at this time.    Documentation for virtual audio and video telecommunications through [facility] encounter:  The patient was located at home. The provider was located in the office. The patient did consent to this visit and is aware of possible charges through their insurance for this visit.  The other persons participating in this telemedicine service were none. Time spent on call was 20 minutes and in review of previous records 20 minutes total.  This virtual service is not related to other E/M service within previous 7 days.   HPI Chief Complaint  Patient presents with   UTI    UTI- back pain, cramping, frequency- took test over the weekend and positive for UTI   Having some back pain this past week, upper left and right side.  Has had some urinary pressure, and having urinary frequency.  Did UTI test strips OTC.   3 different OTC UA tests shows leukocytes.  Nitrites were negative.  No blood in urine.  No fever.   Has had some nausea x 1 day.   No vomiting, no diarrhea.   Having some sharp pains in left abdomen.  No hx/o kidney stone.  Last UTI a while ago, one every prior years ago.  No urinary odor.   No vaginal discharge.  Has had some urinary incontinence.   No other aggravating or relieving factors. No other complaint.   Past Medical History:  Diagnosis Date   Acid indigestion    OTC as needed   Anxiety    panic attacks   Bimalleolar ankle fracture 08/24/2013   right    Depression    Headache    Thyroid disease    Wears contact lenses    Current Outpatient Medications on File Prior to Visit  Medication Sig Dispense Refill   ALPRAZolam (XANAX) 0.5 MG tablet Take 1/2 - 1 tablet by mouth every 6 hours as needed for severe anxiety and panic 30 tablet 1   Biotin w/ Vitamins C & E (HAIR/SKIN/NAILS PO) Take by mouth.     busPIRone (BUSPAR) 15 MG tablet Take 7.5-10 mg twice daily 60 tablet 2   cholecalciferol (VITAMIN D3) 25 MCG (1000 UNIT) tablet Take 1 tablet by mouth daily. 30 tablet 1   doxepin (SINEQUAN) 10 MG capsule Take 1 capsule by mouth at bedtime. 30 capsule 2   levothyroxine (SYNTHROID) 50 MCG tablet TAKE 1 TABLET BY MOUTH DAILY. 90 tablet 0   Melatonin 5 MG LOZG Place under the tongue at bedtime as needed.     omeprazole (PRILOSEC) 40 MG capsule TAKE 1 CAPSULE BY MOUTH DAILY. 30 capsule 0   sertraline (ZOLOFT) 100 MG tablet Take 2 tablets (200 mg total) by mouth daily. 180 tablet 0   Doxepin HCl 6 MG TABS Take 0.5-1 tablets (3-6 mg total) by mouth at bedtime as needed for insomnia (Patient not taking: Reported on 05/04/2022) 30 tablet 2   No current facility-administered medications on file prior to visit.   Past Surgical History:  Procedure Laterality Date   CHOLECYSTECTOMY N/A 12/19/2017   Procedure: LAPAROSCOPIC CHOLECYSTECTOMY;  Surgeon: Kinsinger, De Blanch, MD;  Location: [facility] OR;  Service: General;  Laterality: N/A;   LAPAROSCOPIC TUBAL LIGATION Bilateral 08/01/2013   Procedure: LAPAROSCOPIC TUBAL LIGATION WITH FILSHIE CLIPS;  Surgeon: Meriel Pica, MD;  Location: [facility];  Service: Gynecology;  Laterality: Bilateral;   ORIF ANKLE FRACTURE Right 08/29/2013   Procedure: OPEN REDUCTION INTERNAL FIXATION (ORIFRIGHT BIMALLEOLAR  ANKLE FRACTURE;  Surgeon: Sheral Apley, MD;  Location: [facility];  Service: Orthopedics;  Laterality: Right;   Review of Systems As in subjective    Objective:   Physical Exam Due to coronavirus  pandemic stay at home measures, patient visit was virtual and they were not examined in person.   Gen: wd, wn nad Looks somewhat uncomfortable     Assessment:     Encounter Diagnoses  Name Primary?   Dysuria Yes   Upper back pain        Plan:     We discussed symptoms and concerns and possible differential.  Differential could include UTI, kidney stone, ovarian cyst or other.  She has history of tubal ligation.  Symptoms are more likely UTI.  She may be able to come up to our office this afternoon and leave with urine sample.  We discussed symptoms of the differential.  If she is not able to come and leave a urine specimen today then she will go ahead and start Bactrim antibiotic.  We discussed 3 to 5-day course of antibiotics.  Discussed possibly of urine culture if she does come up and leave a urine sample.  If worse or not improving the next 2 to 3 days, then call or recheck.  Chandni was seen today for uti.  Diagnoses and all orders for this visit:  Dysuria  Upper back pain  Other orders -     sulfamethoxazole-trimethoprim (BACTRIM DS) 800-160 MG tablet; Take 1 tablet by mouth 2 (two) times daily.  F/u prn

## 2022-05-06 LAB — URINE CULTURE

## 2022-05-08 ENCOUNTER — Ambulatory Visit: Payer: 59 | Admitting: Psychiatry

## 2022-05-19 ENCOUNTER — Other Ambulatory Visit (HOSPITAL_COMMUNITY): Payer: Self-pay

## 2022-05-19 ENCOUNTER — Encounter: Payer: Self-pay | Admitting: Internal Medicine

## 2022-06-04 ENCOUNTER — Other Ambulatory Visit: Payer: Self-pay | Admitting: Medical

## 2022-06-04 ENCOUNTER — Other Ambulatory Visit (HOSPITAL_COMMUNITY): Payer: Self-pay

## 2022-06-04 MED ORDER — LEVOTHYROXINE SODIUM 50 MCG PO TABS
50.0000 ug | ORAL_TABLET | Freq: Every day | ORAL | 0 refills | Status: DC
  Filled 2022-06-04: qty 90, 90d supply, fill #0

## 2022-06-05 ENCOUNTER — Other Ambulatory Visit (HOSPITAL_COMMUNITY): Payer: Self-pay

## 2022-07-07 ENCOUNTER — Other Ambulatory Visit (HOSPITAL_COMMUNITY): Payer: Self-pay

## 2022-07-08 ENCOUNTER — Other Ambulatory Visit (HOSPITAL_COMMUNITY): Payer: Self-pay

## 2022-07-16 ENCOUNTER — Ambulatory Visit: Payer: 59 | Admitting: Psychiatry

## 2022-07-20 ENCOUNTER — Other Ambulatory Visit: Payer: Self-pay | Admitting: Psychiatry

## 2022-07-20 DIAGNOSIS — F410 Panic disorder [episodic paroxysmal anxiety] without agoraphobia: Secondary | ICD-10-CM

## 2022-07-22 ENCOUNTER — Other Ambulatory Visit (HOSPITAL_COMMUNITY): Payer: Self-pay

## 2022-07-22 ENCOUNTER — Other Ambulatory Visit: Payer: Self-pay

## 2022-07-22 MED ORDER — ALPRAZOLAM 0.5 MG PO TABS
ORAL_TABLET | ORAL | 1 refills | Status: DC
  Filled 2022-07-22: qty 30, 8d supply, fill #0
  Filled 2022-08-31: qty 30, 8d supply, fill #1

## 2022-07-23 ENCOUNTER — Other Ambulatory Visit: Payer: Self-pay

## 2022-07-23 ENCOUNTER — Other Ambulatory Visit (HOSPITAL_COMMUNITY): Payer: Self-pay

## 2022-08-31 ENCOUNTER — Other Ambulatory Visit: Payer: Self-pay | Admitting: Medical

## 2022-08-31 ENCOUNTER — Other Ambulatory Visit: Payer: Self-pay | Admitting: Psychiatry

## 2022-08-31 DIAGNOSIS — K219 Gastro-esophageal reflux disease without esophagitis: Secondary | ICD-10-CM

## 2022-08-31 DIAGNOSIS — F4010 Social phobia, unspecified: Secondary | ICD-10-CM

## 2022-08-31 DIAGNOSIS — F410 Panic disorder [episodic paroxysmal anxiety] without agoraphobia: Secondary | ICD-10-CM

## 2022-09-01 ENCOUNTER — Other Ambulatory Visit: Payer: Self-pay

## 2022-09-01 ENCOUNTER — Encounter (HOSPITAL_COMMUNITY): Payer: Self-pay

## 2022-09-01 ENCOUNTER — Other Ambulatory Visit (HOSPITAL_COMMUNITY): Payer: Self-pay

## 2022-09-01 MED ORDER — LEVOTHYROXINE SODIUM 50 MCG PO TABS
50.0000 ug | ORAL_TABLET | Freq: Every day | ORAL | 0 refills | Status: DC
  Filled 2022-09-01: qty 30, 30d supply, fill #0

## 2022-09-01 MED ORDER — OMEPRAZOLE 40 MG PO CPDR
40.0000 mg | DELAYED_RELEASE_CAPSULE | Freq: Every day | ORAL | 0 refills | Status: DC
  Filled 2022-09-01: qty 30, 30d supply, fill #0

## 2022-09-01 NOTE — Telephone Encounter (Signed)
Please call to schedule an appt, was due in December.  

## 2022-09-02 ENCOUNTER — Other Ambulatory Visit (HOSPITAL_COMMUNITY): Payer: Self-pay

## 2022-09-04 ENCOUNTER — Other Ambulatory Visit (HOSPITAL_COMMUNITY): Payer: Self-pay

## 2022-09-04 DIAGNOSIS — Z79899 Other long term (current) drug therapy: Secondary | ICD-10-CM | POA: Diagnosis not present

## 2022-09-04 DIAGNOSIS — Z0000 Encounter for general adult medical examination without abnormal findings: Secondary | ICD-10-CM | POA: Diagnosis not present

## 2022-09-04 DIAGNOSIS — F411 Generalized anxiety disorder: Secondary | ICD-10-CM | POA: Diagnosis not present

## 2022-09-04 DIAGNOSIS — Z13220 Encounter for screening for lipoid disorders: Secondary | ICD-10-CM | POA: Diagnosis not present

## 2022-09-04 DIAGNOSIS — E611 Iron deficiency: Secondary | ICD-10-CM | POA: Diagnosis not present

## 2022-09-04 DIAGNOSIS — K219 Gastro-esophageal reflux disease without esophagitis: Secondary | ICD-10-CM | POA: Diagnosis not present

## 2022-09-04 DIAGNOSIS — E039 Hypothyroidism, unspecified: Secondary | ICD-10-CM | POA: Diagnosis not present

## 2022-09-04 DIAGNOSIS — F324 Major depressive disorder, single episode, in partial remission: Secondary | ICD-10-CM | POA: Diagnosis not present

## 2022-09-04 MED ORDER — OMEPRAZOLE 40 MG PO CPDR
40.0000 mg | DELAYED_RELEASE_CAPSULE | Freq: Every morning | ORAL | 1 refills | Status: DC
  Filled 2022-09-04 – 2022-10-23 (×2): qty 90, 90d supply, fill #0
  Filled 2023-02-04: qty 90, 90d supply, fill #1

## 2022-09-04 MED ORDER — LEVOTHYROXINE SODIUM 50 MCG PO TABS
50.0000 ug | ORAL_TABLET | Freq: Every morning | ORAL | 1 refills | Status: DC
  Filled 2022-09-04 – 2022-10-23 (×2): qty 90, 90d supply, fill #0
  Filled 2023-05-13: qty 90, 90d supply, fill #1

## 2022-09-08 ENCOUNTER — Telehealth: Payer: Self-pay | Admitting: Medical

## 2022-09-08 NOTE — Telephone Encounter (Signed)
[facility] records request forwarded to [facility]

## 2022-09-11 NOTE — Telephone Encounter (Signed)
Please call patient to schedule an appt.-

## 2022-09-16 ENCOUNTER — Other Ambulatory Visit: Payer: Self-pay | Admitting: Psychiatry

## 2022-09-16 DIAGNOSIS — F410 Panic disorder [episodic paroxysmal anxiety] without agoraphobia: Secondary | ICD-10-CM

## 2022-09-16 DIAGNOSIS — F4010 Social phobia, unspecified: Secondary | ICD-10-CM

## 2022-09-22 ENCOUNTER — Other Ambulatory Visit: Payer: Self-pay | Admitting: Psychiatry

## 2022-09-22 DIAGNOSIS — F410 Panic disorder [episodic paroxysmal anxiety] without agoraphobia: Secondary | ICD-10-CM

## 2022-09-22 DIAGNOSIS — F4010 Social phobia, unspecified: Secondary | ICD-10-CM

## 2022-09-24 ENCOUNTER — Telehealth: Payer: Self-pay | Admitting: Psychiatry

## 2022-09-24 ENCOUNTER — Other Ambulatory Visit (HOSPITAL_COMMUNITY): Payer: Self-pay

## 2022-09-24 DIAGNOSIS — F410 Panic disorder [episodic paroxysmal anxiety] without agoraphobia: Secondary | ICD-10-CM

## 2022-09-24 DIAGNOSIS — F4010 Social phobia, unspecified: Secondary | ICD-10-CM

## 2022-09-24 MED ORDER — SERTRALINE HCL 100 MG PO TABS
200.0000 mg | ORAL_TABLET | Freq: Every day | ORAL | 0 refills | Status: DC
  Filled 2022-09-24: qty 60, 30d supply, fill #0

## 2022-09-24 NOTE — Telephone Encounter (Signed)
Pt called in and scheduled an appt  09/29/22 with Arnetha Massy. She would like a RF on Zoloft if possible . Send to: [facility]

## 2022-09-24 NOTE — Telephone Encounter (Signed)
Sent!

## 2022-10-01 ENCOUNTER — Ambulatory Visit (INDEPENDENT_AMBULATORY_CARE_PROVIDER_SITE_OTHER): Payer: Commercial Managed Care - PPO | Admitting: Psychiatry

## 2022-10-01 ENCOUNTER — Other Ambulatory Visit (HOSPITAL_COMMUNITY): Payer: Self-pay

## 2022-10-01 ENCOUNTER — Other Ambulatory Visit: Payer: Self-pay

## 2022-10-01 ENCOUNTER — Encounter: Payer: Self-pay | Admitting: Psychiatry

## 2022-10-01 VITALS — BP 142/90 | HR 93

## 2022-10-01 DIAGNOSIS — F411 Generalized anxiety disorder: Secondary | ICD-10-CM | POA: Diagnosis not present

## 2022-10-01 DIAGNOSIS — G4700 Insomnia, unspecified: Secondary | ICD-10-CM

## 2022-10-01 DIAGNOSIS — F410 Panic disorder [episodic paroxysmal anxiety] without agoraphobia: Secondary | ICD-10-CM

## 2022-10-01 DIAGNOSIS — F4010 Social phobia, unspecified: Secondary | ICD-10-CM

## 2022-10-01 MED ORDER — ALPRAZOLAM 0.5 MG PO TABS
0.2500 mg | ORAL_TABLET | Freq: Four times a day (QID) | ORAL | 2 refills | Status: DC | PRN
  Filled 2022-10-01: qty 30, fill #0
  Filled 2022-11-09: qty 30, 8d supply, fill #0
  Filled 2023-02-04: qty 30, 8d supply, fill #1

## 2022-10-01 MED ORDER — CLONIDINE HCL 0.1 MG PO TABS
0.0500 mg | ORAL_TABLET | Freq: Every day | ORAL | 3 refills | Status: DC
  Filled 2022-10-01: qty 30, 30d supply, fill #0

## 2022-10-01 MED ORDER — SERTRALINE HCL 100 MG PO TABS
200.0000 mg | ORAL_TABLET | Freq: Every day | ORAL | 1 refills | Status: DC
  Filled 2022-10-01 – 2022-10-23 (×2): qty 180, 90d supply, fill #0
  Filled 2023-02-04: qty 180, 90d supply, fill #1

## 2022-10-01 MED ORDER — BUSPIRONE HCL 15 MG PO TABS
15.0000 mg | ORAL_TABLET | Freq: Two times a day (BID) | ORAL | 1 refills | Status: DC
  Filled 2022-10-01: qty 180, 90d supply, fill #0

## 2022-10-01 NOTE — Progress Notes (Signed)
Heather Maynard OR:[id number] December 04, 1980 42 y.o.  Subjective:   Patient ID:  Heather Maynard is a 42 y.o. (DOB 1980-11-12) female.  Chief Complaint:  Chief Complaint  Patient presents with   Depression   Anxiety   Insomnia    HPI Heather Maynard presents to the office today for follow-up of depression, anxiety, and insomnia. She reports, "I've actually been struggling a little bit.Marland Kitchen getting depressed" and having periods of increased anxiety. She reports periods of depression lasting days and is easily tearful. She reports that on those days it was challenging to work and had trouble focusing. She reports that when not having depression her mood has been "ok...not quite myself" and slightly lower. Reports that mood changes started late last year and happen 2-3 times a month. She reports that her anxiety worsens during periods of depression. She reports that she has chest pain and shortness of breath with anxiety- "on the verge" of panic. She reports anxiety causes frustration since it interferes with things. Increased anxiety for the past month. Waking up with heart palpitations. Difficulty falling asleep- "can't shut my brain off." Typically able to stay asleep. Reports vivid dreams.   She reports that her mood fluctuates without apparent trigger. Anxious with new things. Denies impulsive or risky behavior- "I'm probably the opposite of that." Denies irritability. Energy and motivation have been somewhat low overall. She reports that she has not been as hungry recently. Denies SI.   Getting ready to list their house. She found a new house that is closer to her sister and in a better school district. Middle child is getting ready to graduate and oldest child has moved out. Has an 39 yo daughter.   She has been using Xanax prn more this past month.    Past Psychiatric Medication Trials: Sertraline- Has taken off and on. Stopped during her pregnancies and back a few years ago. She feels that  it has helped overall. "I don't panic as much." Took 200 mg in the past. Has had discontinuation with coming off Sertraline. Buspar Xanax-Takes for severe panic Wellbutrin- Took briefly Trazodone- restless legs Doxepin-Excessive daytime somnolence with 10 mg  PHQ2-9    Flowsheet Row Office Visit from 05/28/2021 in [place] Visit from 03/04/2020 in [place] Visit from 01/13/2019 in [place] Visit from 02/17/2018 in [facility]  PHQ-2 Total Score 0 2 0 0  PHQ-9 Total Score 3 5 -- --        Review of Systems:  Review of Systems  Cardiovascular:  Positive for palpitations.  Musculoskeletal:  Negative for gait problem.  Psychiatric/Behavioral:         Please refer to HPI    Medications: I have reviewed the patient's current medications.  Current Outpatient Medications  Medication Sig Dispense Refill   cholecalciferol (VITAMIN D3) 25 MCG (1000 UNIT) tablet Take 1 tablet by mouth daily. 30 tablet 1   cloNIDine (CATAPRES) 0.1 MG tablet Take 0.5 tablets (0.05 mg total) by mouth at bedtime for 2-4 nights, then may increase to 1 tablet nightly at bedtime 30 tablet 3   doxepin (SINEQUAN) 10 MG capsule Take 1 capsule by mouth at bedtime. (Patient taking differently: Take 10 mg by mouth at bedtime as needed.) 30 capsule 2   Iron, Ferrous Sulfate, 325 (65 Fe) MG TABS 1 tablet Orally once a day     levothyroxine (SYNTHROID) 50 MCG tablet Take 1 tablet (50 mcg total) by mouth in  the morning on an empty stomach 90 tablet 1   Melatonin 5 MG LOZG Place under the tongue at bedtime as needed.     omeprazole (PRILOSEC) 40 MG capsule Take 1 capsule (40 mg total) by mouth in the morning 30 minutes before first meal of the day 90 capsule 1   ALPRAZolam (XANAX) 0.5 MG tablet Take 0.5-1 tablets (0.25-0.5 mg total) by mouth every 6 (six) hours as needed for severe anxiety and panic. 30 tablet 2   busPIRone (BUSPAR) 15 MG tablet Take  1 tablet (15 mg total) by mouth 2 (two) times daily. 180 tablet 1   sertraline (ZOLOFT) 100 MG tablet Take 2 tablets (200 mg total) by mouth daily. 180 tablet 1   No current facility-administered medications for this visit.    Medication Side Effects: None  Allergies: No Known Allergies  Past Medical History:  Diagnosis Date   Acid indigestion    OTC as needed   Anxiety    panic attacks   Bimalleolar ankle fracture 08/24/2013   right   Depression    Headache    Thyroid disease    Wears contact lenses     Past Medical History, Surgical history, Social history, and Family history were reviewed and updated as appropriate.   Please see review of systems for further details on the patient's review from today.   Objective:   Physical Exam:  BP (!) 142/90   Pulse 93   Physical Exam Constitutional:      General: She is not in acute distress. Musculoskeletal:        General: No deformity.  Neurological:     Mental Status: She is alert and oriented to person, place, and time.     Coordination: Coordination normal.  Psychiatric:        Attention and Perception: Attention and perception normal. She does not perceive auditory or visual hallucinations.        Mood and Affect: Mood is anxious and depressed. Affect is not labile, blunt, angry or inappropriate.        Speech: Speech normal.        Behavior: Behavior normal.        Thought Content: Thought content normal. Thought content is not paranoid or delusional. Thought content does not include homicidal or suicidal ideation. Thought content does not include homicidal or suicidal plan.        Cognition and Memory: Cognition and memory normal.        Judgment: Judgment normal.     Comments: Insight intact     Lab Review:     Component Value Date/Time   NA 138 05/28/2021 1510   K 4.0 05/28/2021 1510   CL 101 05/28/2021 1510   CO2 21 05/28/2021 1510   GLUCOSE 89 05/28/2021 1510   GLUCOSE 98 06/07/2019 1415   BUN 6  05/28/2021 1510   CREATININE 0.70 05/28/2021 1510   CREATININE 0.65 08/19/2017 1352   CALCIUM 9.8 05/28/2021 1510   PROT 8.0 05/28/2021 1510   ALBUMIN 5.1 (H) 05/28/2021 1510   AST 13 05/28/2021 1510   ALT 7 05/28/2021 1510   ALKPHOS 82 05/28/2021 1510   BILITOT 0.3 05/28/2021 1510   GFRNONAA 115 03/04/2020 1535   GFRAA 132 03/04/2020 1535       Component Value Date/Time   WBC 7.3 05/28/2021 1510   WBC 8.2 06/07/2019 1415   RBC 4.69 05/28/2021 1510   RBC 5.09 06/07/2019 1415   HGB 11.6 05/28/2021 1510  HCT 36.2 05/28/2021 1510   PLT 368 05/28/2021 1510   MCV 77 (L) 05/28/2021 1510   MCH 24.7 (L) 05/28/2021 1510   MCH 25.1 (L) 06/07/2019 1415   MCHC 32.0 05/28/2021 1510   MCHC 30.8 06/07/2019 1415   RDW 14.9 05/28/2021 1510   LYMPHSABS 2.2 05/18/2018 1516   EOSABS 0.3 05/18/2018 1516   BASOSABS 0.1 05/18/2018 1516    No results found for: "POCLITH", "LITHIUM"   No results found for: "PHENYTOIN", "PHENOBARB", "VALPROATE", "CBMZ"   .res Assessment: Plan:    Discussed treatment options for anxiety, depression, and insomnia. She reports that she prefers not to add a medication for depression at this time and is hopeful that mood will improve with possibly buying a new house and selling current house since she notices some improvement in her mood since this process began.  Discussed potential benefits, risks, and side effects of Clonidine to improve anxiety, insomnia, and physical s/s of anxiety. Will start Clonidine 0.1 mg 1/2 tab po QHS for 2-4 nights, then increase to 1 tab po QHS. Will continue Sertraline 200 mg po qd for anxiety and depression. Continue Buspar 15 mg po BID for anxiety.  Will continue Doxepin prn insomnia, however she may no longer need Doxepin if sleep improves with Clonidine.  Pt to follow-up in 3 months or sooner if clinically indicated.  Patient advised to contact office with any questions, adverse effects, or acute worsening in signs and  symptoms.   Aradhana was seen today for depression, anxiety and insomnia.  Diagnoses and all orders for this visit:  Panic disorder -     cloNIDine (CATAPRES) 0.1 MG tablet; Take 0.5 tablets (0.05 mg total) by mouth at bedtime for 2-4 nights, then may increase to 1 tablet nightly at bedtime -     ALPRAZolam (XANAX) 0.5 MG tablet; Take 0.5-1 tablets (0.25-0.5 mg total) by mouth every 6 (six) hours as needed for severe anxiety and panic. -     busPIRone (BUSPAR) 15 MG tablet; Take 1 tablet (15 mg total) by mouth 2 (two) times daily. -     sertraline (ZOLOFT) 100 MG tablet; Take 2 tablets (200 mg total) by mouth daily.  Generalized anxiety disorder -     cloNIDine (CATAPRES) 0.1 MG tablet; Take 0.5 tablets (0.05 mg total) by mouth at bedtime for 2-4 nights, then may increase to 1 tablet nightly at bedtime -     busPIRone (BUSPAR) 15 MG tablet; Take 1 tablet (15 mg total) by mouth 2 (two) times daily.  Social anxiety disorder -     busPIRone (BUSPAR) 15 MG tablet; Take 1 tablet (15 mg total) by mouth 2 (two) times daily. -     sertraline (ZOLOFT) 100 MG tablet; Take 2 tablets (200 mg total) by mouth daily.  Insomnia, unspecified type -     cloNIDine (CATAPRES) 0.1 MG tablet; Take 0.5 tablets (0.05 mg total) by mouth at bedtime for 2-4 nights, then may increase to 1 tablet nightly at bedtime     Please see After Visit Summary for patient specific instructions.  Future Appointments  Date Time Provider [place]  12/30/2022 10:30 AM Thayer Headings, PMHNP [facility] None    No orders of the defined types were placed in this encounter.   -------------------------------

## 2022-10-16 DIAGNOSIS — R600 Localized edema: Secondary | ICD-10-CM | POA: Diagnosis not present

## 2022-10-23 ENCOUNTER — Other Ambulatory Visit: Payer: Self-pay

## 2022-11-09 ENCOUNTER — Other Ambulatory Visit: Payer: Self-pay

## 2022-11-09 ENCOUNTER — Other Ambulatory Visit (HOSPITAL_COMMUNITY): Payer: Self-pay

## 2022-12-11 DIAGNOSIS — M25512 Pain in left shoulder: Secondary | ICD-10-CM | POA: Diagnosis not present

## 2022-12-30 ENCOUNTER — Ambulatory Visit: Payer: Commercial Managed Care - PPO | Admitting: Psychiatry

## 2023-01-14 DIAGNOSIS — D509 Iron deficiency anemia, unspecified: Secondary | ICD-10-CM | POA: Diagnosis not present

## 2023-01-29 ENCOUNTER — Ambulatory Visit: Payer: Commercial Managed Care - PPO | Admitting: Psychiatry

## 2023-02-05 ENCOUNTER — Other Ambulatory Visit (HOSPITAL_COMMUNITY): Payer: Self-pay

## 2023-02-05 ENCOUNTER — Other Ambulatory Visit (HOSPITAL_BASED_OUTPATIENT_CLINIC_OR_DEPARTMENT_OTHER): Payer: Self-pay

## 2023-02-08 ENCOUNTER — Other Ambulatory Visit (HOSPITAL_COMMUNITY): Payer: Self-pay

## 2023-02-19 ENCOUNTER — Ambulatory Visit (INDEPENDENT_AMBULATORY_CARE_PROVIDER_SITE_OTHER): Payer: Commercial Managed Care - PPO | Admitting: Psychiatry

## 2023-02-19 ENCOUNTER — Other Ambulatory Visit (HOSPITAL_COMMUNITY): Payer: Self-pay

## 2023-02-19 ENCOUNTER — Encounter: Payer: Self-pay | Admitting: Psychiatry

## 2023-02-19 ENCOUNTER — Other Ambulatory Visit: Payer: Self-pay

## 2023-02-19 DIAGNOSIS — F4010 Social phobia, unspecified: Secondary | ICD-10-CM | POA: Diagnosis not present

## 2023-02-19 DIAGNOSIS — F411 Generalized anxiety disorder: Secondary | ICD-10-CM | POA: Diagnosis not present

## 2023-02-19 DIAGNOSIS — F410 Panic disorder [episodic paroxysmal anxiety] without agoraphobia: Secondary | ICD-10-CM

## 2023-02-19 MED ORDER — SERTRALINE HCL 100 MG PO TABS
200.0000 mg | ORAL_TABLET | Freq: Every day | ORAL | 1 refills | Status: DC
  Filled 2023-02-19 – 2023-05-12 (×2): qty 180, 90d supply, fill #0
  Filled 2023-08-09: qty 180, 90d supply, fill #1

## 2023-02-19 MED ORDER — ALPRAZOLAM 0.5 MG PO TABS
0.2500 mg | ORAL_TABLET | Freq: Four times a day (QID) | ORAL | 2 refills | Status: DC | PRN
  Filled 2023-02-19: qty 30, 8d supply, fill #0
  Filled 2023-05-12: qty 30, 8d supply, fill #1
  Filled 2023-08-09: qty 30, 8d supply, fill #2

## 2023-02-19 MED ORDER — BUSPIRONE HCL 15 MG PO TABS
15.0000 mg | ORAL_TABLET | Freq: Two times a day (BID) | ORAL | 1 refills | Status: DC
  Filled 2023-02-19: qty 180, 90d supply, fill #0
  Filled 2023-08-09: qty 180, 90d supply, fill #1

## 2023-02-19 NOTE — Progress Notes (Addendum)
Heather Maynard [id number] November 08, 1980 42 y.o.  Subjective:   Patient ID:  Heather Maynard is a 42 y.o. (DOB 1980-10-15) female.  Chief Complaint:  Chief Complaint  Patient presents with   Follow-up    Depression, anxiety, and insomnia    HPI Heather Maynard presents to the office today for follow-up of depression, anxiety, and insomnia. She is accompanied by her youngest daughter. She reports that her mood has been "ok." Had increased anxiety with buying and selling her house. She reports that she did not have to take Alprazolam very often during that period of time and has not needed Alprazolam prn since sell of house. Denies any panic attacks. She reports that she has had some mild anxiety at baseline- "low, very tolerable." She reports palpitations "off and on. Not a lot." She reports occasional irritation. She reports that she is sleeping ok. She reports Clonidine does not make her sleepy. She reports Clonidine may have helped with palpitations and anxiety. She reports that concentration has been ok and this week she has had difficulty with focus and thinks this may be hormone related. Motivation and energy are "hit or miss." Denies any times where she has not been able to get out of bed. Appetite has been good. Denies SI.   She reports that they recently moved and were able to get the house they wanted. Continues to work from home.  She reports occasional work stress.    Past Psychiatric Medication Trials: Sertraline- Has taken off and on. Stopped during her pregnancies and back a few years ago. She feels that it has helped overall. "I don't panic as much." Took 200 mg in the past. Has had discontinuation with coming off Sertraline. Buspar Xanax-Takes for severe panic Wellbutrin- Took briefly Trazodone- restless legs Doxepin-Excessive daytime somnolence with 10 mg Clonidine-May have helped some with palpitations and anxiety.   PHQ2-9    Flowsheet Row Office Visit from  05/28/2021 in [facility] Office Visit from 03/04/2020 in [facility] Office Visit from 01/13/2019 in [facility] Office Visit from 02/17/2018 in [facility]  PHQ-2 Total Score 0 2 0 0  PHQ-9 Total Score 3 5 -- --        Review of Systems:  Review of Systems  Gastrointestinal: Negative.   Musculoskeletal:  Positive for arthralgias. Negative for gait problem.  Neurological:  Negative for tremors and headaches.  Psychiatric/Behavioral:         Please refer to HPI    Medications: I have reviewed the patient's current medications.  Current Outpatient Medications  Medication Sig Dispense Refill   levothyroxine (SYNTHROID) 50 MCG tablet Take 1 tablet (50 mcg total) by mouth in the morning on an empty stomach 90 tablet 1   Melatonin 5 MG LOZG Place under the tongue at bedtime as needed.     omeprazole (PRILOSEC) 40 MG capsule Take 1 capsule (40 mg total) by mouth in the morning 30 minutes before first meal of the day 90 capsule 1   [START ON 04/02/2023] ALPRAZolam (XANAX) 0.5 MG tablet Take 0.5-1 tablets (0.25-0.5 mg total) by mouth every 6 (six) hours as needed for severe anxiety and panic. 30 tablet 2   busPIRone (BUSPAR) 15 MG tablet Take 1 tablet (15 mg total) by mouth 2 (two) times daily. 180 tablet 1   cholecalciferol (VITAMIN D3) 25 MCG (1000 UNIT) tablet Take 1 tablet by mouth daily. 30 tablet 1   Iron, Ferrous Sulfate, 325 (65 Fe) MG  TABS 1 tablet Orally once a day (Patient not taking: Reported on 02/19/2023)     sertraline (ZOLOFT) 100 MG tablet Take 2 tablets (200 mg total) by mouth daily. 180 tablet 1   No current facility-administered medications for this visit.    Medication Side Effects: None  Allergies: No Known Allergies  Past Medical History:  Diagnosis Date   Acid indigestion    OTC as needed   Anxiety    panic attacks   Bimalleolar ankle fracture 08/24/2013   right   Depression    Headache    Thyroid disease     Wears contact lenses     Past Medical History, Surgical history, Social history, and Family history were reviewed and updated as appropriate.   Please see review of systems for further details on the patient's review from today.   Objective:   Physical Exam:  There were no vitals taken for this visit.  Physical Exam  Lab Review:     Component Value Date/Time   NA 138 05/28/2021 1510   K 4.0 05/28/2021 1510   CL 101 05/28/2021 1510   CO2 21 05/28/2021 1510   GLUCOSE 89 05/28/2021 1510   GLUCOSE 98 06/07/2019 1415   BUN 6 05/28/2021 1510   CREATININE 0.70 05/28/2021 1510   CREATININE 0.65 08/19/2017 1352   CALCIUM 9.8 05/28/2021 1510   PROT 8.0 05/28/2021 1510   ALBUMIN 5.1 (H) 05/28/2021 1510   AST 13 05/28/2021 1510   ALT 7 05/28/2021 1510   ALKPHOS 82 05/28/2021 1510   BILITOT 0.3 05/28/2021 1510   GFRNONAA 115 03/04/2020 1535   GFRAA 132 03/04/2020 1535       Component Value Date/Time   WBC 7.3 05/28/2021 1510   WBC 8.2 06/07/2019 1415   RBC 4.69 05/28/2021 1510   RBC 5.09 06/07/2019 1415   HGB 11.6 05/28/2021 1510   HCT 36.2 05/28/2021 1510   PLT 368 05/28/2021 1510   MCV 77 (L) 05/28/2021 1510   MCH 24.7 (L) 05/28/2021 1510   MCH 25.1 (L) 06/07/2019 1415   MCHC 32.0 05/28/2021 1510   MCHC 30.8 06/07/2019 1415   RDW 14.9 05/28/2021 1510   LYMPHSABS 2.2 05/18/2018 1516   EOSABS 0.3 05/18/2018 1516   BASOSABS 0.1 05/18/2018 1516    No results found for: "POCLITH", "LITHIUM"   No results found for: "PHENYTOIN", "PHENOBARB", "VALPROATE", "CBMZ"   .res Assessment: Plan:   I spent 31 minutes dedicated to the care of this patient on the date of this encounter to include pre-visit review of records, face-to-face time with the patient discussing adjusting Buspar for anxiety and mild irritability, ordering of medication, and post visit documentation. Discussed that it may be helpful to increase Buspar to target mild irritability and anxiety. She reports that  she is currently taking 1/3 of a 15 mg tablet at bedtime. Discussed initially trying to take an additional 1/3 tablet at the start of the day. Discussed that she could continue to increase Buspar by 1/3 tablet if needed up to 1 tablet twice daily. Discussed that Buspar does not need to be taken first thing in the morning and can be taken mid-morning or mid-day if needed since she takes only levothyroxine upon awakening. Continue Sertraline 200 mg daily for anxiety and depression.  Continue Alprazolam 0.5 mg 1/2-1 tab po q 6 hours prn panic.  Discussed that Clonidine could be re-started in the future if she experiences an exacerbation of anxiety with palpitations and insomnia.  Pt to  follow-up in 6 months or sooner if clinically indicated.  Patient advised to contact office with any questions, adverse effects, or acute worsening in signs and symptoms.   Heather Maynard was seen today for follow-up.  Diagnoses and all orders for this visit:  Panic disorder -     busPIRone (BUSPAR) 15 MG tablet; Take 1 tablet (15 mg total) by mouth 2 (two) times daily. -     sertraline (ZOLOFT) 100 MG tablet; Take 2 tablets (200 mg total) by mouth daily. -     ALPRAZolam (XANAX) 0.5 MG tablet; Take 0.5-1 tablets (0.25-0.5 mg total) by mouth every 6 (six) hours as needed for severe anxiety and panic.  Generalized anxiety disorder -     busPIRone (BUSPAR) 15 MG tablet; Take 1 tablet (15 mg total) by mouth 2 (two) times daily.  Social anxiety disorder -     busPIRone (BUSPAR) 15 MG tablet; Take 1 tablet (15 mg total) by mouth 2 (two) times daily. -     sertraline (ZOLOFT) 100 MG tablet; Take 2 tablets (200 mg total) by mouth daily.     Please see After Visit Summary for patient specific instructions.  Future Appointments  Date Time Provider Department Center  08/27/2023 12:45 PM Corie Chiquito, PMHNP [facility] None    No orders of the defined types were placed in this encounter.   -------------------------------

## 2023-02-22 ENCOUNTER — Other Ambulatory Visit: Payer: Self-pay

## 2023-03-09 DIAGNOSIS — M25512 Pain in left shoulder: Secondary | ICD-10-CM | POA: Diagnosis not present

## 2023-03-09 DIAGNOSIS — R350 Frequency of micturition: Secondary | ICD-10-CM | POA: Diagnosis not present

## 2023-03-09 DIAGNOSIS — N390 Urinary tract infection, site not specified: Secondary | ICD-10-CM | POA: Diagnosis not present

## 2023-03-17 DIAGNOSIS — M25512 Pain in left shoulder: Secondary | ICD-10-CM | POA: Diagnosis not present

## 2023-03-26 DIAGNOSIS — M25512 Pain in left shoulder: Secondary | ICD-10-CM | POA: Diagnosis not present

## 2023-05-03 DIAGNOSIS — R0602 Shortness of breath: Secondary | ICD-10-CM | POA: Diagnosis not present

## 2023-05-04 DIAGNOSIS — R0602 Shortness of breath: Secondary | ICD-10-CM | POA: Diagnosis not present

## 2023-05-12 ENCOUNTER — Other Ambulatory Visit (HOSPITAL_COMMUNITY): Payer: Self-pay

## 2023-05-13 ENCOUNTER — Other Ambulatory Visit: Payer: Self-pay

## 2023-05-13 ENCOUNTER — Other Ambulatory Visit (HOSPITAL_COMMUNITY): Payer: Self-pay

## 2023-05-13 MED ORDER — OMEPRAZOLE 40 MG PO CPDR
40.0000 mg | DELAYED_RELEASE_CAPSULE | Freq: Every morning | ORAL | 0 refills | Status: DC
  Filled 2023-05-13: qty 90, 90d supply, fill #0

## 2023-06-01 ENCOUNTER — Other Ambulatory Visit (HOSPITAL_COMMUNITY): Payer: Self-pay

## 2023-06-01 MED ORDER — INFLUENZA VIRUS VACC SPLIT PF (FLUZONE) 0.5 ML IM SUSY
0.5000 mL | PREFILLED_SYRINGE | INTRAMUSCULAR | 0 refills | Status: AC
  Filled 2023-06-01: qty 0.5, 1d supply, fill #0

## 2023-06-23 ENCOUNTER — Encounter: Payer: Self-pay | Admitting: Psychiatry

## 2023-08-09 ENCOUNTER — Other Ambulatory Visit: Payer: Self-pay

## 2023-08-09 ENCOUNTER — Other Ambulatory Visit (HOSPITAL_COMMUNITY): Payer: Self-pay

## 2023-08-09 MED ORDER — OMEPRAZOLE 40 MG PO CPDR
40.0000 mg | DELAYED_RELEASE_CAPSULE | Freq: Every morning | ORAL | 0 refills | Status: DC
  Filled 2023-08-09: qty 90, 90d supply, fill #0

## 2023-08-09 MED ORDER — LEVOTHYROXINE SODIUM 50 MCG PO TABS
50.0000 ug | ORAL_TABLET | Freq: Every morning | ORAL | 1 refills | Status: AC
  Filled 2023-08-09: qty 90, 90d supply, fill #0
  Filled 2024-02-22: qty 90, 90d supply, fill #1

## 2023-08-10 ENCOUNTER — Other Ambulatory Visit: Payer: Self-pay

## 2023-08-27 ENCOUNTER — Ambulatory Visit: Payer: Commercial Managed Care - PPO | Admitting: Psychiatry

## 2023-09-02 IMAGING — CT CT CARDIAC CORONARY ARTERY CALCIUM SCORE
3 series · 13 of 20 positions shown, 15 images · non-contrast
Comparison: CTA of the chest on 05/19/2018

CLINICAL DATA: 40-year-old Caucasian female with history of
hyperlipidemia and family history of heart disease.

EXAM:
CT CARDIAC CORONARY ARTERY CALCIUM SCORE
TECHNIQUE: Non-contrast imaging through the heart was performed using
prospective ECG gating. Image post processing was performed on an
independent workstation, allowing for quantitative analysis of the
heart and coronary arteries. Note that this exam targets the heart
and the chest was not imaged in its entirety.

[Series 2: calcium scoring 2.00 qr36 bestdiast 70% hrt calciu · axial · 0.37mm/px · z∈[+1579,+1635]mm · 3 of 70 slices shown]
[im 14/70  vessel]
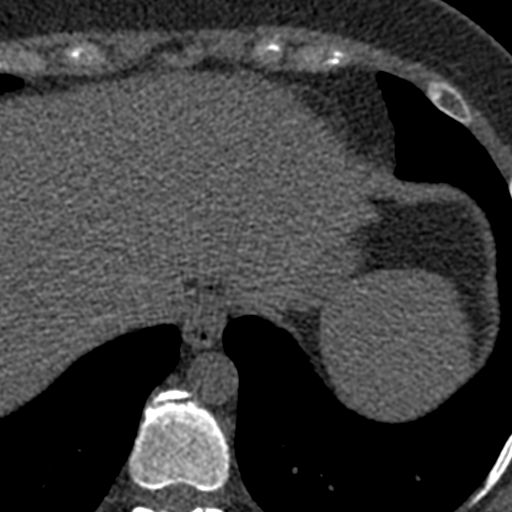
[im 28/70  vessel]
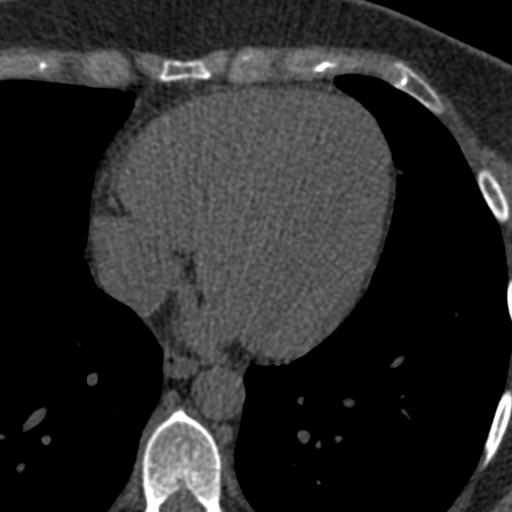
[im 42/70  vessel]
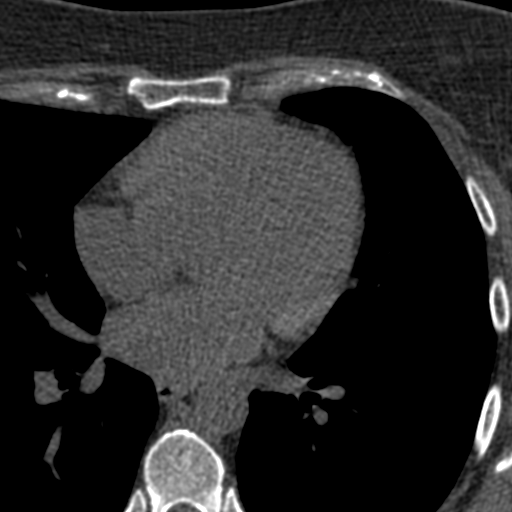

[Series 3: calcium scoring 2.00 br40 bestdiast 70% axial · axial · 0.59mm/px · z∈[+1575,+1667]mm · 5 of 70 slices shown, 7 images]
[im 12/70  vessel]
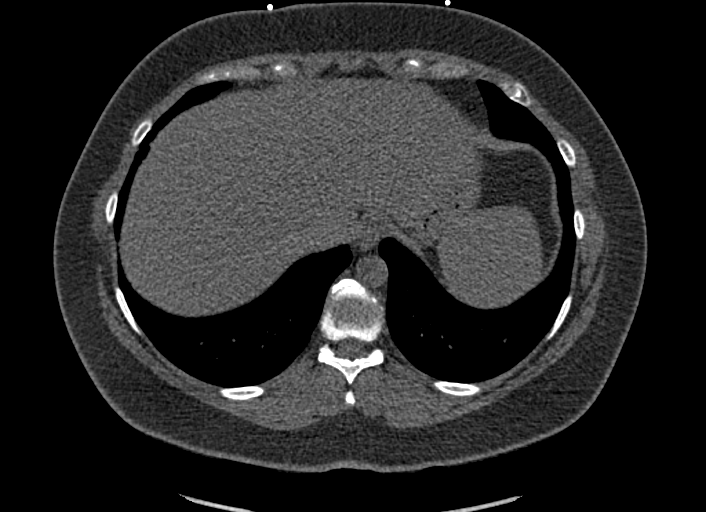
[im 12/70  lung]
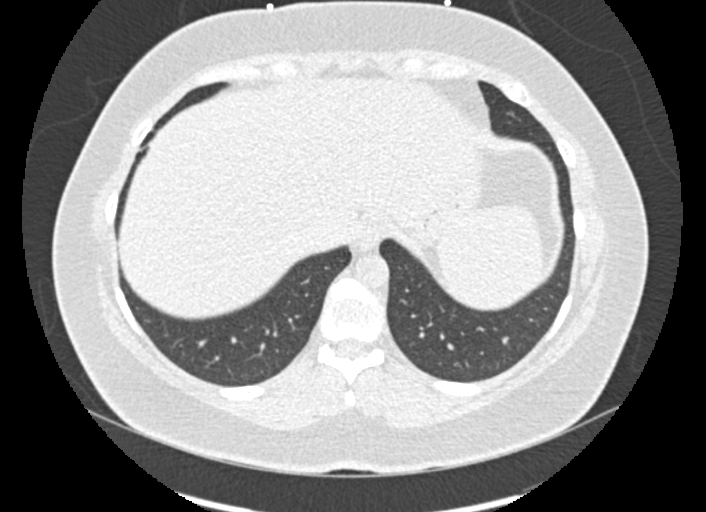
[im 24/70  vessel]
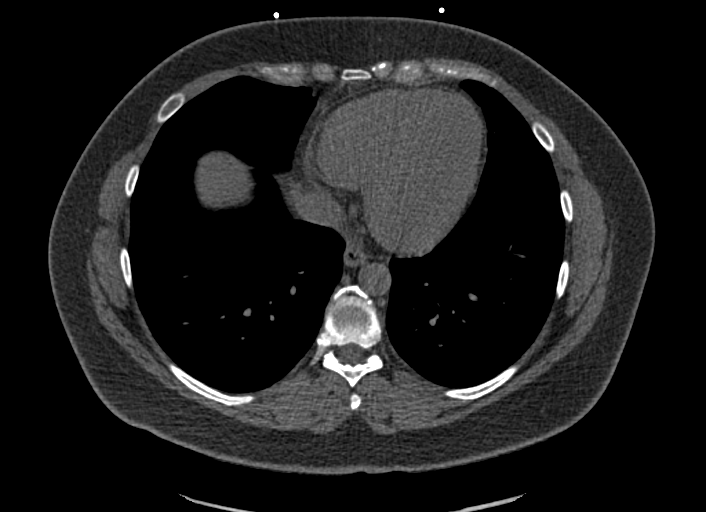
[im 35/70  vessel]
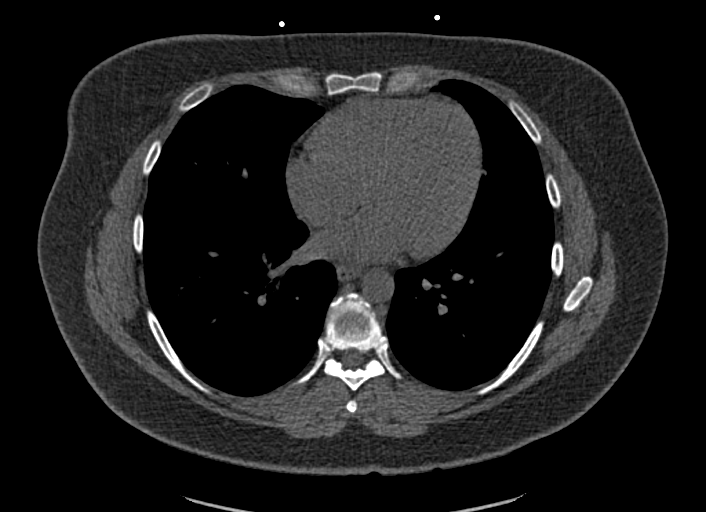
[im 47/70  vessel]
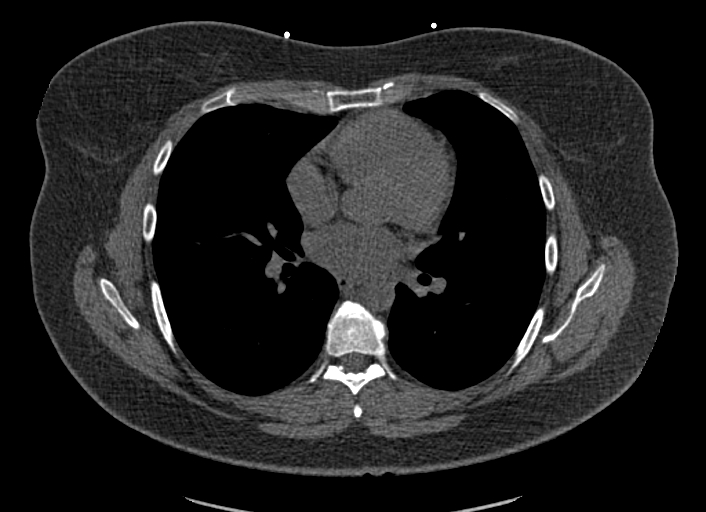
[im 58/70  vessel]
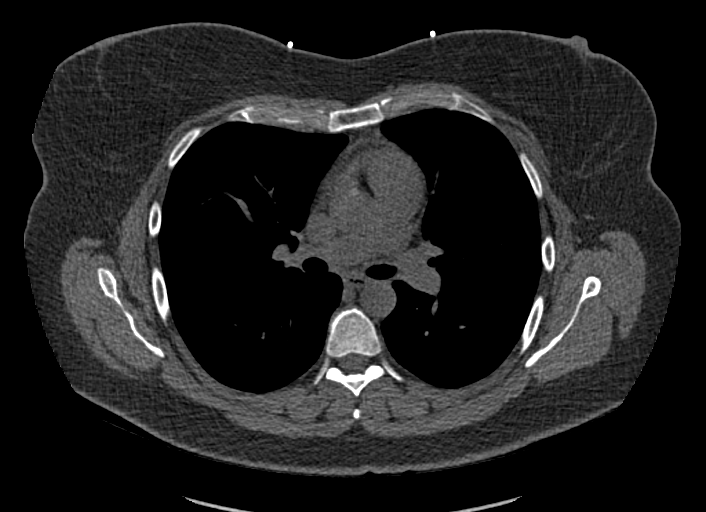
[im 58/70  lung]
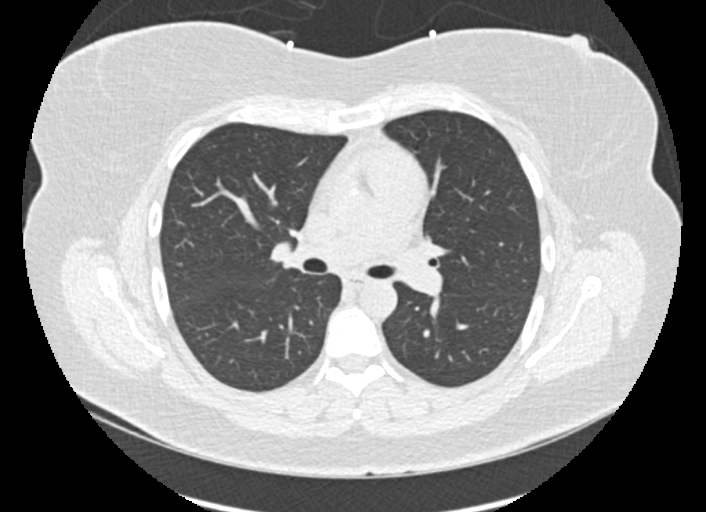

[Series 9: calcium scoring 2.00 br60 bestdiast 70% lungs · axial · 0.59mm/px · z∈[+1575,+1667]mm · 5 of 70 slices shown]
[im 12/70  vessel]
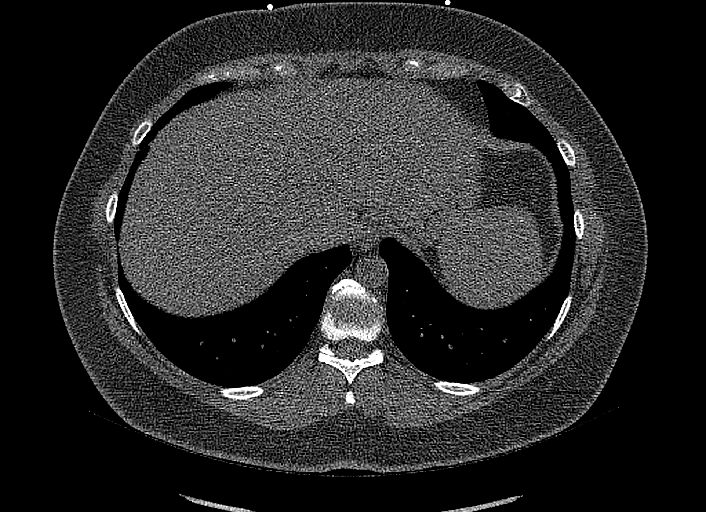
[im 24/70  vessel]
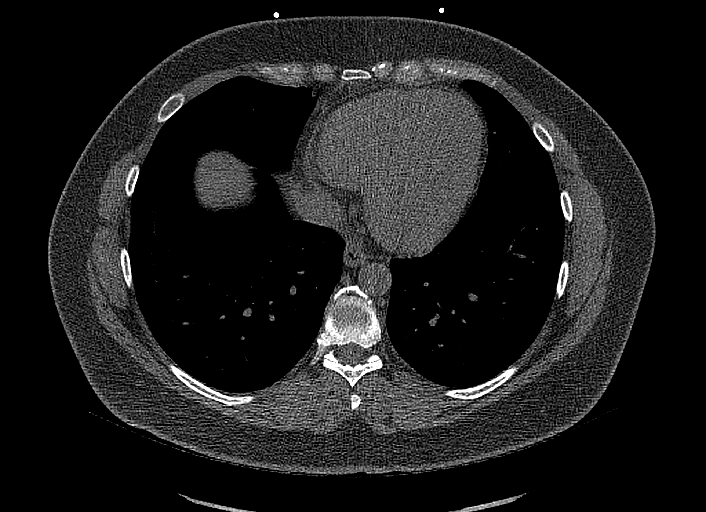
[im 35/70  vessel]
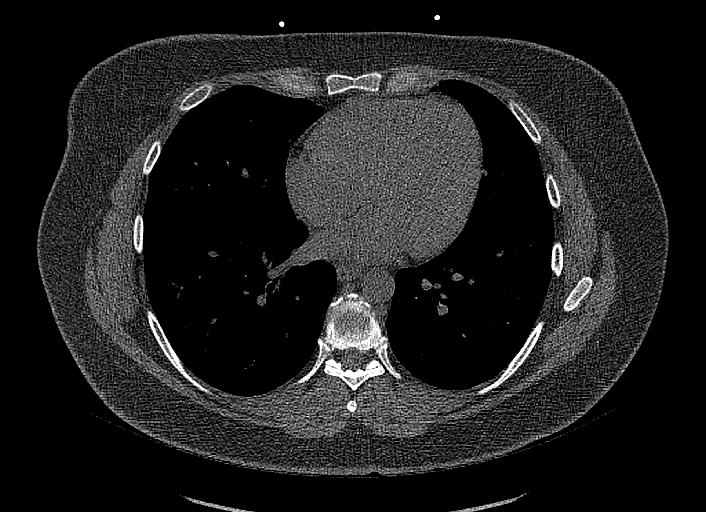
[im 47/70  vessel]
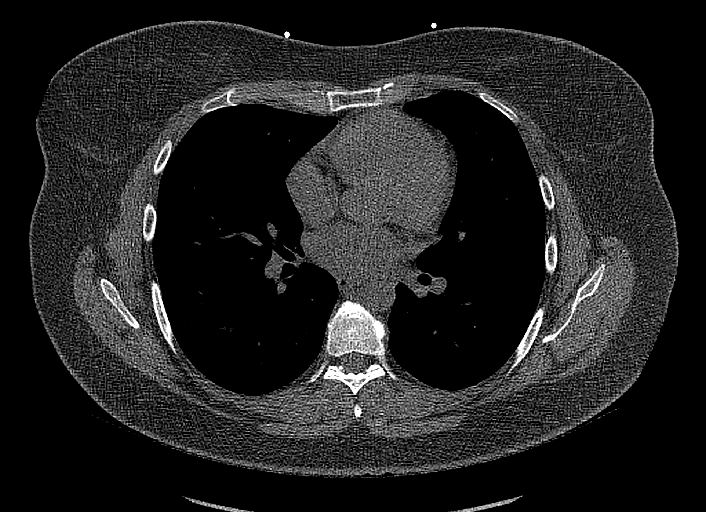
[im 58/70  vessel]
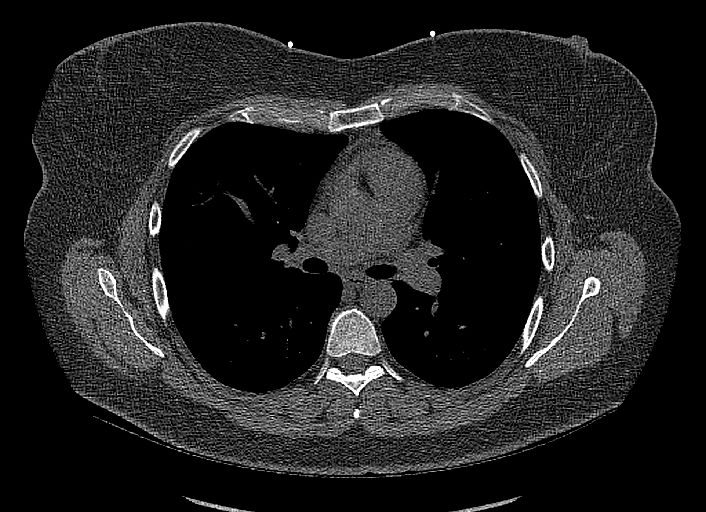

[13 of 20 positions shown; findings below may reference images not displayed]

FINDINGS: CORONARY CALCIUM SCORES:

Left Main: 0

LAD: 0

LCx: 0

RCA: 0

Total Agatston Score: 0

[HOSPITAL] percentile: 0

AORTA MEASUREMENTS:

Ascending Aorta: 24 mm

Descending Aorta: 21 mm

OTHER FINDINGS:


## 2023-09-08 ENCOUNTER — Other Ambulatory Visit (HOSPITAL_COMMUNITY): Payer: Self-pay

## 2023-09-08 DIAGNOSIS — Z79899 Other long term (current) drug therapy: Secondary | ICD-10-CM | POA: Diagnosis not present

## 2023-09-08 DIAGNOSIS — E039 Hypothyroidism, unspecified: Secondary | ICD-10-CM | POA: Diagnosis not present

## 2023-09-08 DIAGNOSIS — F411 Generalized anxiety disorder: Secondary | ICD-10-CM | POA: Diagnosis not present

## 2023-09-08 DIAGNOSIS — E611 Iron deficiency: Secondary | ICD-10-CM | POA: Diagnosis not present

## 2023-09-08 DIAGNOSIS — K219 Gastro-esophageal reflux disease without esophagitis: Secondary | ICD-10-CM | POA: Diagnosis not present

## 2023-09-08 DIAGNOSIS — Z0000 Encounter for general adult medical examination without abnormal findings: Secondary | ICD-10-CM | POA: Diagnosis not present

## 2023-09-08 DIAGNOSIS — Z13220 Encounter for screening for lipoid disorders: Secondary | ICD-10-CM | POA: Diagnosis not present

## 2023-09-08 DIAGNOSIS — F324 Major depressive disorder, single episode, in partial remission: Secondary | ICD-10-CM | POA: Diagnosis not present

## 2023-09-08 MED ORDER — LEVOTHYROXINE SODIUM 50 MCG PO TABS
50.0000 ug | ORAL_TABLET | Freq: Every morning | ORAL | 1 refills | Status: AC
  Filled 2023-09-08 – 2024-06-26 (×2): qty 90, 90d supply, fill #0
  Filled 2024-07-19: qty 90, 90d supply, fill #1

## 2023-09-08 MED ORDER — OMEPRAZOLE 40 MG PO CPDR
40.0000 mg | DELAYED_RELEASE_CAPSULE | Freq: Every morning | ORAL | 1 refills | Status: DC
  Filled 2023-09-08 – 2023-11-08 (×2): qty 90, 90d supply, fill #0
  Filled 2024-01-14 – 2024-01-19 (×2): qty 90, 90d supply, fill #1

## 2023-10-15 ENCOUNTER — Other Ambulatory Visit (HOSPITAL_COMMUNITY): Payer: Self-pay

## 2023-10-15 ENCOUNTER — Other Ambulatory Visit: Payer: Self-pay

## 2023-10-15 DIAGNOSIS — F410 Panic disorder [episodic paroxysmal anxiety] without agoraphobia: Secondary | ICD-10-CM | POA: Diagnosis not present

## 2023-10-15 DIAGNOSIS — F411 Generalized anxiety disorder: Secondary | ICD-10-CM | POA: Diagnosis not present

## 2023-10-15 MED ORDER — ALPRAZOLAM 0.5 MG PO TABS
0.2500 mg | ORAL_TABLET | Freq: Two times a day (BID) | ORAL | 5 refills | Status: DC | PRN
Start: 2023-10-15 — End: 2024-05-24
  Filled 2023-10-15 (×2): qty 30, 15d supply, fill #0
  Filled 2023-11-25: qty 30, 15d supply, fill #1
  Filled 2024-01-14: qty 30, 15d supply, fill #2
  Filled 2024-02-18: qty 30, 15d supply, fill #3
  Filled 2024-03-29: qty 30, 15d supply, fill #4

## 2023-10-15 MED ORDER — SERTRALINE HCL 100 MG PO TABS
100.0000 mg | ORAL_TABLET | Freq: Two times a day (BID) | ORAL | 1 refills | Status: DC
  Filled 2023-10-15 – 2023-10-20 (×3): qty 180, 90d supply, fill #0
  Filled 2024-01-14: qty 180, 90d supply, fill #1

## 2023-10-15 MED ORDER — BUSPIRONE HCL 15 MG PO TABS
15.0000 mg | ORAL_TABLET | Freq: Two times a day (BID) | ORAL | 1 refills | Status: AC
  Filled 2023-10-15 – 2023-10-20 (×3): qty 180, 90d supply, fill #0

## 2023-10-20 ENCOUNTER — Other Ambulatory Visit: Payer: Self-pay

## 2023-10-20 ENCOUNTER — Other Ambulatory Visit (HOSPITAL_COMMUNITY): Payer: Self-pay

## 2023-11-08 ENCOUNTER — Other Ambulatory Visit (HOSPITAL_BASED_OUTPATIENT_CLINIC_OR_DEPARTMENT_OTHER): Payer: Self-pay

## 2023-11-08 ENCOUNTER — Other Ambulatory Visit (HOSPITAL_COMMUNITY): Payer: Self-pay

## 2023-11-17 DIAGNOSIS — Z6835 Body mass index (BMI) 35.0-35.9, adult: Secondary | ICD-10-CM | POA: Diagnosis not present

## 2023-11-17 DIAGNOSIS — Z1231 Encounter for screening mammogram for malignant neoplasm of breast: Secondary | ICD-10-CM | POA: Diagnosis not present

## 2023-11-17 DIAGNOSIS — Z01419 Encounter for gynecological examination (general) (routine) without abnormal findings: Secondary | ICD-10-CM | POA: Diagnosis not present

## 2023-11-22 ENCOUNTER — Other Ambulatory Visit: Payer: Self-pay | Admitting: Obstetrics and Gynecology

## 2023-11-22 DIAGNOSIS — R928 Other abnormal and inconclusive findings on diagnostic imaging of breast: Secondary | ICD-10-CM

## 2023-11-25 ENCOUNTER — Other Ambulatory Visit: Payer: Self-pay

## 2023-11-25 ENCOUNTER — Other Ambulatory Visit (HOSPITAL_COMMUNITY): Payer: Self-pay

## 2023-12-06 ENCOUNTER — Ambulatory Visit
Admission: RE | Admit: 2023-12-06 | Discharge: 2023-12-06 | Disposition: A | Discharge status: Home / Self Care | Source: Ambulatory Visit | Attending: Obstetrics and Gynecology | Admitting: Obstetrics and Gynecology

## 2023-12-06 DIAGNOSIS — R928 Other abnormal and inconclusive findings on diagnostic imaging of breast: Secondary | ICD-10-CM | POA: Diagnosis not present

## 2023-12-06 DIAGNOSIS — N6311 Unspecified lump in the right breast, upper outer quadrant: Secondary | ICD-10-CM | POA: Diagnosis not present

## 2023-12-08 ENCOUNTER — Other Ambulatory Visit: Payer: Self-pay | Admitting: Obstetrics and Gynecology

## 2023-12-08 DIAGNOSIS — N6001 Solitary cyst of right breast: Secondary | ICD-10-CM

## 2023-12-08 DIAGNOSIS — N6310 Unspecified lump in the right breast, unspecified quadrant: Secondary | ICD-10-CM

## 2023-12-28 ENCOUNTER — Other Ambulatory Visit: Payer: Self-pay

## 2023-12-28 ENCOUNTER — Other Ambulatory Visit (HOSPITAL_COMMUNITY): Payer: Self-pay

## 2023-12-28 DIAGNOSIS — M79674 Pain in right toe(s): Secondary | ICD-10-CM | POA: Diagnosis not present

## 2023-12-28 MED ORDER — NAPROXEN 500 MG PO TABS
500.0000 mg | ORAL_TABLET | Freq: Two times a day (BID) | ORAL | 0 refills | Status: AC
  Filled 2023-12-28: qty 28, 14d supply, fill #0

## 2024-01-14 ENCOUNTER — Other Ambulatory Visit (HOSPITAL_COMMUNITY): Payer: Self-pay

## 2024-01-17 ENCOUNTER — Other Ambulatory Visit: Payer: Self-pay

## 2024-01-21 ENCOUNTER — Other Ambulatory Visit: Payer: Self-pay

## 2024-01-21 ENCOUNTER — Other Ambulatory Visit (HOSPITAL_COMMUNITY): Payer: Self-pay

## 2024-01-21 DIAGNOSIS — J069 Acute upper respiratory infection, unspecified: Secondary | ICD-10-CM | POA: Diagnosis not present

## 2024-01-21 DIAGNOSIS — J029 Acute pharyngitis, unspecified: Secondary | ICD-10-CM | POA: Diagnosis not present

## 2024-01-21 MED ORDER — PSEUDOEPH-BROMPHEN-DM 30-2-10 MG/5ML PO SYRP
10.0000 mL | ORAL_SOLUTION | Freq: Four times a day (QID) | ORAL | 0 refills | Status: AC | PRN
  Filled 2024-01-21: qty 60, 2d supply, fill #0
  Filled 2024-01-21: qty 120, 3d supply, fill #0

## 2024-01-21 MED ORDER — LIDOCAINE VISCOUS HCL 2 % MT SOLN
5.0000 mL | Freq: Four times a day (QID) | OROMUCOSAL | 0 refills | Status: AC | PRN
  Filled 2024-01-21: qty 120, 6d supply, fill #0

## 2024-01-21 MED ORDER — IPRATROPIUM BROMIDE 0.06 % NA SOLN
2.0000 | Freq: Four times a day (QID) | NASAL | 0 refills | Status: AC
  Filled 2024-01-21: qty 15, 20d supply, fill #0

## 2024-01-26 ENCOUNTER — Other Ambulatory Visit (HOSPITAL_COMMUNITY): Payer: Self-pay

## 2024-01-26 ENCOUNTER — Other Ambulatory Visit: Payer: Self-pay

## 2024-01-26 DIAGNOSIS — F411 Generalized anxiety disorder: Secondary | ICD-10-CM | POA: Diagnosis not present

## 2024-01-26 DIAGNOSIS — F410 Panic disorder [episodic paroxysmal anxiety] without agoraphobia: Secondary | ICD-10-CM | POA: Diagnosis not present

## 2024-01-26 MED ORDER — BUSPIRONE HCL 15 MG PO TABS
7.5000 mg | ORAL_TABLET | Freq: Two times a day (BID) | ORAL | 1 refills | Status: AC
  Filled 2024-01-26: qty 180, 90d supply, fill #0

## 2024-01-26 MED ORDER — SERTRALINE HCL 100 MG PO TABS
100.0000 mg | ORAL_TABLET | Freq: Two times a day (BID) | ORAL | 1 refills | Status: AC
  Filled 2024-01-26 – 2024-04-14 (×2): qty 180, 90d supply, fill #0
  Filled 2024-05-12 – 2024-07-19 (×2): qty 180, 90d supply, fill #1

## 2024-01-27 ENCOUNTER — Other Ambulatory Visit (HOSPITAL_COMMUNITY): Payer: Self-pay

## 2024-01-31 ENCOUNTER — Other Ambulatory Visit (HOSPITAL_COMMUNITY): Payer: Self-pay

## 2024-02-18 ENCOUNTER — Other Ambulatory Visit (HOSPITAL_COMMUNITY): Payer: Self-pay

## 2024-02-21 ENCOUNTER — Other Ambulatory Visit: Payer: Self-pay

## 2024-02-22 ENCOUNTER — Other Ambulatory Visit (HOSPITAL_COMMUNITY): Payer: Self-pay

## 2024-02-22 ENCOUNTER — Other Ambulatory Visit: Payer: Self-pay

## 2024-03-03 DIAGNOSIS — L6681 Central centrifugal cicatricial alopecia: Secondary | ICD-10-CM | POA: Diagnosis not present

## 2024-03-24 ENCOUNTER — Other Ambulatory Visit (HOSPITAL_COMMUNITY): Payer: Self-pay

## 2024-03-29 ENCOUNTER — Other Ambulatory Visit: Payer: Self-pay

## 2024-03-29 ENCOUNTER — Other Ambulatory Visit (HOSPITAL_COMMUNITY): Payer: Self-pay

## 2024-03-30 ENCOUNTER — Other Ambulatory Visit (HOSPITAL_COMMUNITY): Payer: Self-pay

## 2024-03-30 MED ORDER — OMEPRAZOLE 40 MG PO CPDR
40.0000 mg | DELAYED_RELEASE_CAPSULE | Freq: Every morning | ORAL | 1 refills | Status: AC
  Filled 2024-03-30 – 2024-04-14 (×2): qty 90, 90d supply, fill #0
  Filled 2024-05-12 – 2024-07-19 (×3): qty 90, 90d supply, fill #1

## 2024-04-14 ENCOUNTER — Other Ambulatory Visit (HOSPITAL_COMMUNITY): Payer: Self-pay

## 2024-04-14 ENCOUNTER — Other Ambulatory Visit: Payer: Self-pay

## 2024-05-12 ENCOUNTER — Other Ambulatory Visit (HOSPITAL_COMMUNITY): Payer: Self-pay

## 2024-05-12 ENCOUNTER — Encounter (HOSPITAL_COMMUNITY): Payer: Self-pay

## 2024-05-19 ENCOUNTER — Ambulatory Visit
Admission: RE | Admit: 2024-05-19 | Discharge: 2024-05-19 | Disposition: A | Discharge status: Home / Self Care | Source: Ambulatory Visit | Attending: Obstetrics and Gynecology | Admitting: Obstetrics and Gynecology

## 2024-05-19 DIAGNOSIS — N6310 Unspecified lump in the right breast, unspecified quadrant: Secondary | ICD-10-CM

## 2024-05-19 DIAGNOSIS — N6001 Solitary cyst of right breast: Secondary | ICD-10-CM

## 2024-05-19 DIAGNOSIS — R928 Other abnormal and inconclusive findings on diagnostic imaging of breast: Secondary | ICD-10-CM | POA: Diagnosis not present

## 2024-05-24 ENCOUNTER — Other Ambulatory Visit (HOSPITAL_COMMUNITY): Payer: Self-pay

## 2024-05-24 ENCOUNTER — Other Ambulatory Visit: Payer: Self-pay

## 2024-05-24 DIAGNOSIS — F410 Panic disorder [episodic paroxysmal anxiety] without agoraphobia: Secondary | ICD-10-CM | POA: Diagnosis not present

## 2024-05-24 DIAGNOSIS — F411 Generalized anxiety disorder: Secondary | ICD-10-CM | POA: Diagnosis not present

## 2024-05-24 MED ORDER — BUSPIRONE HCL 15 MG PO TABS
7.5000 mg | ORAL_TABLET | Freq: Two times a day (BID) | ORAL | 1 refills | Status: AC
  Filled 2024-05-24: qty 180, 90d supply, fill #0

## 2024-05-24 MED ORDER — ALPRAZOLAM 0.5 MG PO TABS
0.2500 mg | ORAL_TABLET | Freq: Two times a day (BID) | ORAL | 5 refills | Status: AC | PRN
  Filled 2024-05-24: qty 30, 15d supply, fill #0
  Filled 2024-06-26: qty 30, 15d supply, fill #1
  Filled 2024-08-16: qty 30, 15d supply, fill #2

## 2024-05-24 MED ORDER — SERTRALINE HCL 100 MG PO TABS
100.0000 mg | ORAL_TABLET | Freq: Two times a day (BID) | ORAL | 1 refills | Status: AC
  Filled 2024-05-24 – 2024-08-16 (×2): qty 180, 90d supply, fill #0

## 2024-06-27 ENCOUNTER — Other Ambulatory Visit: Payer: Self-pay

## 2024-06-27 ENCOUNTER — Other Ambulatory Visit (HOSPITAL_COMMUNITY): Payer: Self-pay

## 2024-07-20 ENCOUNTER — Other Ambulatory Visit: Payer: Self-pay

## 2024-07-20 ENCOUNTER — Other Ambulatory Visit (HOSPITAL_COMMUNITY): Payer: Self-pay

## 2024-07-26 ENCOUNTER — Emergency Department (HOSPITAL_COMMUNITY)

## 2024-07-26 ENCOUNTER — Emergency Department (HOSPITAL_COMMUNITY)
Admission: EM | Admit: 2024-07-26 | Discharge: 2024-07-26 | Disposition: A | Discharge status: Home / Self Care | Source: Ambulatory Visit | Attending: Emergency Medicine | Admitting: Emergency Medicine

## 2024-07-26 ENCOUNTER — Other Ambulatory Visit (HOSPITAL_COMMUNITY): Payer: Self-pay

## 2024-07-26 ENCOUNTER — Other Ambulatory Visit: Payer: Self-pay

## 2024-07-26 DIAGNOSIS — A084 Viral intestinal infection, unspecified: Secondary | ICD-10-CM | POA: Diagnosis not present

## 2024-07-26 DIAGNOSIS — R109 Unspecified abdominal pain: Secondary | ICD-10-CM | POA: Diagnosis not present

## 2024-07-26 DIAGNOSIS — R140 Abdominal distension (gaseous): Secondary | ICD-10-CM | POA: Diagnosis not present

## 2024-07-26 DIAGNOSIS — R162 Hepatomegaly with splenomegaly, not elsewhere classified: Secondary | ICD-10-CM | POA: Diagnosis not present

## 2024-07-26 DIAGNOSIS — R1032 Left lower quadrant pain: Secondary | ICD-10-CM | POA: Diagnosis not present

## 2024-07-26 LAB — COMPREHENSIVE METABOLIC PANEL WITH GFR
ALT: 14 U/L (ref 0–44)
AST: 19 U/L (ref 15–41)
Albumin: 4.7 g/dL (ref 3.5–5.0)
Alkaline Phosphatase: 70 U/L (ref 38–126)
Anion gap: 9 (ref 5–15)
BUN: 6 mg/dL (ref 6–20)
CO2: 25 mmol/L (ref 22–32)
Calcium: 9.9 mg/dL (ref 8.9–10.3)
Chloride: 102 mmol/L (ref 98–111)
Creatinine, Ser: 0.73 mg/dL (ref 0.44–1.00)
GFR, Estimated: >60 mL/min (ref >60)
Glucose, Bld: 93 mg/dL (ref 70–99)
Potassium: 3.8 mmol/L (ref 3.5–5.1)
Sodium: 136 mmol/L (ref 135–145)
Total Bilirubin: 0.5 mg/dL (ref 0.0–1.2)
Total Protein: 7.8 g/dL (ref 6.5–8.1)

## 2024-07-26 LAB — CBC
HCT: 35.5 % — ABNORMAL LOW (ref 36.0–46.0)
Hemoglobin: 11.5 g/dL — ABNORMAL LOW (ref 12.0–15.0)
MCH: 25.7 pg — ABNORMAL LOW (ref 26.0–34.0)
MCHC: 32.4 g/dL (ref 30.0–36.0)
MCV: 79.2 fL — ABNORMAL LOW (ref 80.0–100.0)
Platelets: 331 K/uL (ref 150–400)
RBC: 4.48 MIL/uL (ref 3.87–5.11)
RDW: 15.7 % — ABNORMAL HIGH (ref 11.5–15.5)
WBC: 7.6 K/uL (ref 4.0–10.5)
nRBC: 0 % (ref 0.0–0.2)

## 2024-07-26 LAB — LIPASE, BLOOD: Lipase: 16 U/L (ref 11–51)

## 2024-07-26 LAB — URINALYSIS, ROUTINE W REFLEX MICROSCOPIC
Bilirubin Urine: NEGATIVE
Glucose, UA: NEGATIVE mg/dL
Hgb urine dipstick: NEGATIVE
Ketones, ur: NEGATIVE mg/dL
Leukocytes,Ua: NEGATIVE
Nitrite: NEGATIVE
Protein, ur: NEGATIVE mg/dL
Specific Gravity, Urine: 1.021 (ref 1.005–1.030)
pH: 5 (ref 5.0–8.0)

## 2024-07-26 LAB — HCG, SERUM, QUALITATIVE: Preg, Serum: NEGATIVE

## 2024-07-26 MED ORDER — ONDANSETRON HCL 4 MG PO TABS
4.0000 mg | ORAL_TABLET | Freq: Four times a day (QID) | ORAL | 0 refills | Status: AC
  Filled 2024-07-26: qty 12, 3d supply, fill #0

## 2024-07-26 MED ORDER — IOHEXOL 350 MG/ML SOLN
100.0000 mL | Freq: Once | INTRAVENOUS | Status: AC | PRN
  Administered 2024-07-26: 17:00:00 100 mL via INTRAVENOUS

## 2024-07-26 MED ORDER — ONDANSETRON 4 MG PO TBDP
4.0000 mg | ORAL_TABLET | Freq: Once | ORAL | Status: AC
  Administered 2024-07-26: 19:00:00 4 mg via ORAL
  Filled 2024-07-26: qty 1

## 2024-07-26 NOTE — ED Provider Triage Note (Signed)
 Emergency Medicine Provider Triage Evaluation Note  Heather Maynard , a 43 y.o. female  was evaluated in triage.  Pt complains of left-sided flank pain with vomiting, diarrhea, bloating for the past week.  Referred here by walk-in clinic for CT.  Review of Systems  Positive:  Negative:   Physical Exam  BP 132/78 (BP Location: Right Arm)   Pulse 73   Temp 98.7 F (37.1 C) (Oral)   Resp 17   Ht 4' 11.5 (1.511 m)   Wt 63 kg   LMP 06/30/2024   SpO2 99%   BMI 27.60 kg/m  Gen:   Awake, no distress   Resp:  Normal effort  MSK:   Moves extremities without difficulty  Other:    Medical Decision Making  Medically screening exam initiated at 3:48 PM.  Appropriate orders placed.  AUNA MIKKELSEN was informed that the remainder of the evaluation will be completed by another provider, this initial triage assessment does not replace that evaluation, and the importance of remaining in the ED until their evaluation is complete.     Donnajean Lynwood DEL, PA-C 07/26/24 1549

## 2024-07-26 NOTE — ED Triage Notes (Signed)
 Patient sent to ED by PCP for CT scan mid left flank pain. She is having bloating, nausea, vomiting, diarrhea and constipation for the past week. She reports the symptoms are gradually getting worse.  Denies fevers.

## 2024-07-26 NOTE — ED Provider Notes (Signed)
 [place] EMERGENCY DEPARTMENT AT [facility] Provider Note   CSN: [id number] Arrival date & time: 07/26/24  1424     Patient presents with: No chief complaint on file.   Heather Maynard is a 43 y.o. female.  With a history of GERD, fatty liver and obesity who presents to the ED for abdominal pain.  Symptoms began last week of nausea vomiting diarrhea abdominal bloating.  States her symptoms are improving but still present.  Was seen at walk-in clinic today and was directed to the ED for further evaluation by her PCP for concern of potential diverticulitis with reported left lower quadrant pain.  No fevers chills chest pain short of breath.  Status post cholecystectomy many years ago   HPI     Prior to Admission medications  Medication Sig Start Date End Date Taking? Authorizing Provider  ondansetron  (ZOFRAN ) 4 MG tablet Take 1 tablet (4 mg total) by mouth every 6 (six) hours. 07/26/24  Yes Pamella Ozell LABOR, DO  ALPRAZolam  (XANAX ) 0.5 MG tablet Take 0.5-1 tablets (0.25-0.5 mg total) by mouth 2 (two) times daily as needed for panic 05/24/24     brompheniramine-pseudoephedrine-DM 30-2-10 MG/5ML syrup Take 10 mLs by mouth 4 (four) times daily as needed. 01/21/24     busPIRone  (BUSPAR ) 15 MG tablet Take 1 tablet (15 mg total) by mouth 2 (two) times daily. 10/15/23     busPIRone  (BUSPAR ) 15 MG tablet Take 0.5-1 tablets (7.5-15 mg total) by mouth 2 (two) times daily. 01/26/24     busPIRone  (BUSPAR ) 15 MG tablet Take 0.5-1 tablets (7.5-15 mg total) by mouth 2 (two) times daily. 05/24/24     cholecalciferol  (VITAMIN D3) 25 MCG (1000 UNIT) tablet Take 1 tablet by mouth daily. 09/15/21   Tysinger, Alm RAMAN, PA-C  ipratropium (ATROVENT ) 0.06 % nasal spray Place 2 sprays into both nostrils 4 (four) times daily. 01/21/24     Iron, Ferrous Sulfate, 325 (65 Fe) MG TABS 1 tablet Orally once a day Patient not taking: Reported on 02/19/2023 09/07/22   [provider]  levothyroxine   (SYNTHROID ) 50 MCG tablet Take 1 tablet (50 mcg total) by mouth in the morning on an empty stomach 08/09/23     levothyroxine  (SYNTHROID ) 50 MCG tablet Take 1 tablet (50 mcg total) by mouth in the morning on an empty stomach 09/08/23     lidocaine  (XYLOCAINE ) 2 % solution Use as directed 5 mLs in the mouth or throat every 6 (six) hours as needed 1 hour before meal 01/21/24     Melatonin 5 MG LOZG Place under the tongue at bedtime as needed.    [provider]  naproxen  (NAPROSYN ) 500 MG tablet Take 1 tablet (500 mg total) by mouth 2 (two) times daily with a meal for 14 days. 12/28/23     omeprazole  (PRILOSEC) 40 MG capsule Take 1 capsule (40 mg total) by mouth in the morning 30 minutes before first meal of the day 03/30/24     sertraline  (ZOLOFT ) 100 MG tablet Take 1 tablet (100 mg total) by mouth 2 (two) times daily. 01/26/24     sertraline  (ZOLOFT ) 100 MG tablet Take 1 tablet (100 mg total) by mouth 2 (two) times daily. 05/24/24     Doxepin  HCl 6 MG TABS Take 0.5-1 tablets (3-6 mg total) by mouth at bedtime as needed for insomnia Patient not taking: Reported on 05/04/2022 04/16/22 09/02/22  Franchot Harlene SQUIBB, PMHNP    Allergies: Patient has no known allergies.  Review of Systems  Updated Vital Signs BP 128/75   Pulse 73   Temp 98.7 F (37.1 C) (Oral)   Resp 16   Ht 4' 11.5 (1.511 m)   Wt 63 kg   LMP 06/30/2024   SpO2 100%   BMI 27.60 kg/m   Physical Exam Vitals and nursing note reviewed.  HENT:     Head: Normocephalic and atraumatic.  Eyes:     Pupils: Pupils are equal, round, and reactive to light.  Cardiovascular:     Rate and Rhythm: Normal rate and regular rhythm.  Pulmonary:     Effort: Pulmonary effort is normal.     Breath sounds: Normal breath sounds.  Abdominal:     Palpations: Abdomen is soft.     Tenderness: There is abdominal tenderness. There is no guarding or rebound.     Comments: Mild left lower quadrant tenderness  Skin:    General: Skin is warm and  dry.  Neurological:     Mental Status: She is alert.  Psychiatric:        Mood and Affect: Mood normal.     (all labs ordered are listed, but only abnormal results are displayed) Labs Reviewed  CBC - Abnormal; Notable for the following components:      Result Value   Hemoglobin 11.5 (*)    HCT 35.5 (*)    MCV 79.2 (*)    MCH 25.7 (*)    RDW 15.7 (*)    All other components within normal limits  URINALYSIS, ROUTINE W REFLEX MICROSCOPIC - Abnormal; Notable for the following components:   APPearance HAZY (*)    All other components within normal limits  LIPASE, BLOOD  COMPREHENSIVE METABOLIC PANEL WITH GFR  HCG, SERUM, QUALITATIVE    EKG: None  Radiology: CT ABDOMEN PELVIS W CONTRAST Result Date: 07/26/2024 EXAM: CT ABDOMEN AND PELVIS WITH CONTRAST 07/09/2021 TECHNIQUE: CT of the abdomen and pelvis was performed with the administration of 100 mL of iohexol  (OMNIPAQUE ) 350 MG/ML injection. Multiplanar reformatted images are provided for review. Automated exposure control, iterative reconstruction, and/or weight-based adjustment of the mA/kV was utilized to reduce the radiation dose to as low as reasonably achievable. COMPARISON: None available. CLINICAL HISTORY: Abdominal pain, acute, nonlocalized; Left flank pain, bloating, vomiting, diarrhea. FINDINGS: LOWER CHEST: No acute abnormality. LIVER: The liver is borderline enlarged/mildly enlarged. GALLBLADDER AND BILE DUCTS: The gallbladder is surgically absent. No biliary ductal dilatation. SPLEEN: The spleen is borderline enlarged/mildly enlarged. PANCREAS: No acute abnormality. ADRENAL GLANDS: No acute abnormality. KIDNEYS, URETERS AND BLADDER: No stones in the kidneys or ureters. No hydronephrosis. No perinephric or periureteral stranding. Urinary bladder is unremarkable. GI AND BOWEL: Stomach demonstrates no acute abnormality. The appendix appears normal. There is no bowel obstruction. PERITONEUM AND RETROPERITONEUM: No ascites. No free  air. VASCULATURE: Aorta is normal in caliber. LYMPH NODES: No lymphadenopathy. REPRODUCTIVE ORGANS: There are surgical clips in the bilateral adnexa. Follicles are noted in the right ovary. BONES AND SOFT TISSUES: No acute osseous abnormality. No focal soft tissue abnormality. IMPRESSION: 1. No acute findings in the abdomen or pelvis. 2. Borderline to mild hepatosplenomegaly. 3. Surgically absent gallbladder. Electronically signed by: Greig Pique MD 07/26/2024 05:33 PM EST RP Workstation: [id number]     Procedures   Medications Ordered in the ED  ondansetron  (ZOFRAN -ODT) disintegrating tablet 4 mg (has no administration in time range)  iohexol  (OMNIPAQUE ) 350 MG/ML injection 100 mL (100 mLs Intravenous Contrast Given 07/26/24 1724)    Clinical Course as of  07/26/24 1843  Wed Jul 26, 2024  1841 Laboratory workup unremarkable.  Pregnancy negative.  UA negative for UTI.  CT abdomen pelvis shows no evidence of diverticulitis or other acute intra-abdominal pathology.  Mild hepatosplenomegaly.  Symptoms most likely due to resolution phase of viral GI illness.  Will discharge with Zofran  and instruction for PCP follow-up [MP]    Clinical Course User Index [MP] Pamella Ozell LABOR, DO                                 Medical Decision Making 43 year old female with history as above presented to the ED for 1 week of GI symptoms including nausea vomiting abdominal pain and bloating.  Sent in from PCP office given concern for left upper quadrant pain potential diverticulitis.  Some mild left lower quadrant tenderness on my exam without rebound rigidity or guarding.  Differential diagnosis includes: Acute intra-abdominal infectious/inflammatory process such as appendicitis, diverticulitis, pancreatitis Kidney stone Uterine fibroid Urinary tract infection Atypical presentation for pneumonia Viral gastroenteritis  Will obtain laboratory workup including CBC with differential, metabolic panel, lipase and  urinalysis CT abdomen pelvis to evaluate  Amount and/or Complexity of Data Reviewed Labs: ordered.  Risk Prescription drug management.        Final diagnoses:  Viral gastroenteritis    ED Discharge Orders          Ordered    ondansetron  (ZOFRAN ) 4 MG tablet  Every 6 hours        07/26/24 1842               Pamella Ozell LABOR, DO 07/26/24 1843

## 2024-07-26 NOTE — Discharge Instructions (Addendum)
 You were seen in the Emergency Department for abdominal pain nausea vomiting The lab work and CAT scan did not show any findings that were consistent with diverticulitis or other significant infections Mild enlargement of the spleen and liver Your pregnancy test was negative This may be resolution of the viral GI illness you had last week We have called in a prescription for Zofran  for you to pick up in your pharmacy begin taking as directed to help with the nausea and vomiting Keep well-hydrated Follow-up with your primary doctor in 1 week for reevaluation Return to the emerged department for severe pain or any other concerns

## 2024-07-27 ENCOUNTER — Other Ambulatory Visit: Payer: Self-pay

## 2024-08-16 ENCOUNTER — Other Ambulatory Visit (HOSPITAL_COMMUNITY): Payer: Self-pay

## 2024-08-16 ENCOUNTER — Other Ambulatory Visit: Payer: Self-pay

## 2024-08-17 ENCOUNTER — Other Ambulatory Visit: Payer: Self-pay
